# Patient Record
Sex: Female | Born: 1975 | Race: White | Hispanic: No | Marital: Married | Smoking: Never smoker
Health system: Southern US, Community
[De-identification: ages and names within clinical notes are randomized; demographics above are authoritative.]

## PROBLEM LIST (undated history)

## (undated) DIAGNOSIS — K900 Celiac disease: Secondary | ICD-10-CM

## (undated) DIAGNOSIS — R51 Headache: Secondary | ICD-10-CM

## (undated) DIAGNOSIS — R112 Nausea with vomiting, unspecified: Secondary | ICD-10-CM

## (undated) DIAGNOSIS — Z923 Personal history of irradiation: Secondary | ICD-10-CM

## (undated) DIAGNOSIS — E039 Hypothyroidism, unspecified: Secondary | ICD-10-CM

## (undated) DIAGNOSIS — E079 Disorder of thyroid, unspecified: Secondary | ICD-10-CM

## (undated) DIAGNOSIS — C801 Malignant (primary) neoplasm, unspecified: Secondary | ICD-10-CM

## (undated) DIAGNOSIS — IMO0001 Reserved for inherently not codable concepts without codable children: Secondary | ICD-10-CM

## (undated) DIAGNOSIS — Z9889 Other specified postprocedural states: Secondary | ICD-10-CM

## (undated) DIAGNOSIS — IMO0002 Reserved for concepts with insufficient information to code with codable children: Secondary | ICD-10-CM

## (undated) DIAGNOSIS — F419 Anxiety disorder, unspecified: Secondary | ICD-10-CM

## (undated) DIAGNOSIS — Z9221 Personal history of antineoplastic chemotherapy: Secondary | ICD-10-CM

## (undated) HISTORY — PX: GALLBLADDER SURGERY: SHX652

## (undated) HISTORY — PX: MASTECTOMY: SHX3

## (undated) HISTORY — PX: CHOLECYSTECTOMY: SHX55

## (undated) HISTORY — DX: Celiac disease: K90.0

## (undated) HISTORY — DX: Disorder of thyroid, unspecified: E07.9

---

## 2013-12-24 HISTORY — PX: MASTECTOMY: SHX3

## 2014-08-11 ENCOUNTER — Other Ambulatory Visit (INDEPENDENT_AMBULATORY_CARE_PROVIDER_SITE_OTHER): Payer: Self-pay | Admitting: General Surgery

## 2014-08-23 ENCOUNTER — Other Ambulatory Visit (INDEPENDENT_AMBULATORY_CARE_PROVIDER_SITE_OTHER): Payer: Self-pay

## 2014-08-23 ENCOUNTER — Encounter (INDEPENDENT_AMBULATORY_CARE_PROVIDER_SITE_OTHER): Payer: Self-pay | Admitting: General Surgery

## 2014-08-23 ENCOUNTER — Ambulatory Visit (INDEPENDENT_AMBULATORY_CARE_PROVIDER_SITE_OTHER): Payer: BC Managed Care – PPO | Admitting: General Surgery

## 2014-08-23 VITALS — BP 130/72 | HR 79 | Temp 98.5°F | Ht 62.0 in | Wt 121.2 lb

## 2014-08-23 DIAGNOSIS — D0592 Unspecified type of carcinoma in situ of left breast: Secondary | ICD-10-CM

## 2014-08-23 DIAGNOSIS — C50911 Malignant neoplasm of unspecified site of right female breast: Secondary | ICD-10-CM | POA: Insufficient documentation

## 2014-08-23 DIAGNOSIS — D0591 Unspecified type of carcinoma in situ of right breast: Secondary | ICD-10-CM

## 2014-08-23 DIAGNOSIS — C50919 Malignant neoplasm of unspecified site of unspecified female breast: Secondary | ICD-10-CM

## 2014-08-23 NOTE — Progress Notes (Addendum)
Patient ID: Misty [name], female   DOB: [birth year], 38 y.o.   MRN: [id number]  Chief Complaint  Patient presents with  . New Evaluation    Breast    HPI Misty [name] is a 38 y.o. female.  She is referred by Dr. 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  This is a healthy young woman. I have done her laparoscopic cholecystectomy. She is here today with her husband and her mother. She has not had any breast problems in the past although they did surveillance of the right breast every 6 months in 2011. She felt a lump in the lower outer quadrant of the right breast.She has some intermittent pain on both sides. Imaging studies were done including mammograms and ultrasound. They found a 2.1 cm suspicious mass in the right lower outer quadrant at 8:00 which was palpable. There was a 1 cm mass in the right breast at the 5:00 position. Additionally she had a small mass at the 3:00 position, subareolar and another small mass at the 10:00 position, 7 cm from the nipple. The left breast looked basically normal, apparently, a cyst was seen in the upper outer quadrant.   Image guided biopsy of the right breast mass at 8:00 in the right breast mass at 5:00 both showed invasive ductal carcinoma. I do not see a breast diagnostic profile. MRI has not been scheduled.  Comorbidities include celiac disease, hypothyroidism, C-section, cholecystectomy  Family history reveals breast cancer in a maternal grandmother and a maternal great aunt. A paternal great aunt died of some kind of cancer. There's no ovarian cancer in the family. HPI  Past Medical History  Diagnosis Date  . Thyroid disease   . Celiac disease     Past Surgical History  Procedure Laterality Date  . Cesarean section    . Gallbladder surgery      Family History  Problem Relation Age of Onset  . Skin cancer Father   . Breast cancer Maternal Grandmother     Social History History   Substance Use Topics  . Smoking status: Never Smoker   . Smokeless tobacco: Not on file  . Alcohol Use: No    Allergies  Allergen Reactions  . Gluten Meal   . Penicillins Nausea And Vomiting    Current Outpatient Prescriptions  Medication Sig Dispense Refill  . SYNTHROID 100 MCG tablet        No current facility-administered medications for this visit.    Review of Systems Review of Systems  Constitutional: Negative for fever, chills and unexpected weight change.  HENT: Negative for congestion, hearing loss, sore throat, trouble swallowing and voice change.   Eyes: Negative for visual disturbance.  Respiratory: Negative for cough and wheezing.   Cardiovascular: Negative for chest pain, palpitations and leg swelling.  Gastrointestinal: Negative for nausea, vomiting, abdominal pain, diarrhea, constipation, blood in stool, abdominal distention and anal bleeding.  Genitourinary: Negative for hematuria, vaginal bleeding and difficulty urinating.  Musculoskeletal: Negative for arthralgias.  Skin: Negative for rash and wound.  Neurological: Negative for seizures, syncope and headaches.  Hematological: Negative for adenopathy. Does not bruise/bleed easily.  Psychiatric/Behavioral: Negative for confusion.    Blood pressure 130/72, pulse 79, temperature 98.5 F (36.9 C), temperature source Oral, height 5' 2"  (1.575 m), weight 121 lb 4 oz (54.999 kg).  Physical Exam Physical Exam  Constitutional: She is oriented to person, place, and time. She appears well-developed and well-nourished. No distress.  HENT:  Head: Normocephalic and atraumatic.  Nose: Nose normal.  Mouth/Throat: No oropharyngeal exudate.  Eyes: Conjunctivae and EOM are normal. Pupils are equal, round, and reactive to light. Left eye exhibits no discharge. No scleral icterus.  Neck: Neck supple. No JVD present. No tracheal deviation present. No thyromegaly present.  Cardiovascular: Normal rate, regular rhythm,  normal heart sounds and intact distal pulses.   No murmur heard. Pulmonary/Chest: Effort normal and breath sounds normal. No respiratory distress. She has no wheezes. She has no rales. She exhibits no tenderness.  Breasts are small. Palpable mass and ecchymoses right breast lower outer quadrant. Mobile. Some lumpiness medially. No axillary adenopathy. Left breast is soft. No masses. No axillary adenopathy.  Abdominal: Soft. Bowel sounds are normal. She exhibits no distension and no mass. There is no tenderness. There is no rebound and no guarding.  Well-healed laparoscopy scars.  Musculoskeletal: She exhibits no edema and no tenderness.  Lymphadenopathy:    She has no cervical adenopathy.  Neurological: She is alert and oriented to person, place, and time. She exhibits normal muscle tone. Coordination normal.  Skin: Skin is warm. No rash noted. She is not diaphoretic. No erythema. No pallor.  Psychiatric: She has a normal mood and affect. Her behavior is normal. Judgment and thought content normal.    Data Reviewed Mammograms, [place] and mammograms reports. Biopsy and the report from the [facility] pathology department. We're scheduling genetic counseling, referral to medical oncology, MRI, requesting the slides for over read, plastic surgery.  Assessment    Premenopausal, multifocal invasive ductal carcinoma right breast, biopsy proven at 8:00 and 5:00, at least T2 N0 clinically. Breast diagnostic protocol pending.  Celiac disease  Hypothyroidism  Status post C-section  Status post cholecystectomy  Family history breast cancer in maternal grandmother and maternal great aunt      Plan    Very lengthy discussion, over one hour with all concerned. I told her that she would require mastectomy on the right and sentinel node biopsy on the right, with or without reconstruction, at the very minimum. Prophylactic contralateral mastectomy is something she is considering.   She'll be scheduled for  bilateral breast MRI Addendum:   (08/31/2014):     MRI shows extensive, multi-multifocal enhancement in the right breast. The left breast looks normal. All of her lymph nodes looked normal. No evidence of chest wall invasion.   She'll be referred to medical oncology. She requested Dr. Jana Hakim. Dawn is going to work on this.   We will request that the slides be brought down to [place] for overreading and  confirmation of breast diagnostic profile Addendum: 08/31/2014:     Dr. Joya Martyr was kind enough to review her outside pathology. Both breast biopsies confirmed invasive ductal carcinoma. By outside testing estrogen and progesterone receptor are strongly positive,  Ki-67 11%, HER-2-negative by FISH.   She'll be referred for genetic counseling and genetic testing  Addendum: (08/31/2014):    She has seen the genetic counselor and has had blood drawn for genetic testing. BRCA1/BRCA2 are negative BreastNext Gene Panel pending.   She'll be referred to plastic surgery ADDENDUM (08/31/2014):  She saw Dr. Crissie Reese on 08/27/2014. He felt that her best option would be tissue expander followed by implant at a later date. I talked about the lateral versus bilateral surgery. They talk a little bit of that nipple sparing mastectomy, which I think would be feasible on left. She is still considering her options.   She was offered a second opinion,,  she is considering that.    Return to see me in 3 weeks. It will take that long to get all this done.       Edsel Petrin. Dalbert Batman, M.D., [facility] General and Minimally invasive Surgery Breast and Colorectal Surgery Office:   [phone] Pager:   [phone]  08/23/2014, 5:31 PM

## 2014-08-23 NOTE — Patient Instructions (Signed)
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 We will have her slides brought down to [facility] to have the breast pathologist reviewed this  You will be scheduled for a bilateral breast MRI to make sure we were not missing something on either side or in the lymph nodes  You'll be referred to a medical oncologist. You have requested Dr. Jana Hakim  The medical oncologist will decide if you need a PET scan.  You'll be referred to a plastic surgeon to hear about plastic surgical options, since you will need to have a mastectomy on the right side, at least  If you desire a second opinion, please let us know and we will help you arrange that  Return to see Dr. Dalbert Batman in 3 weeks.

## 2014-08-24 ENCOUNTER — Other Ambulatory Visit: Payer: Self-pay | Admitting: Oncology

## 2014-08-25 ENCOUNTER — Other Ambulatory Visit: Payer: Self-pay | Admitting: *Deleted

## 2014-08-25 ENCOUNTER — Ambulatory Visit
Admission: RE | Admit: 2014-08-25 | Discharge: 2014-08-25 | Disposition: A | Discharge status: Home / Self Care | Payer: PRIVATE HEALTH INSURANCE | Source: Ambulatory Visit | Attending: General Surgery | Admitting: General Surgery

## 2014-08-25 DIAGNOSIS — C50511 Malignant neoplasm of lower-outer quadrant of right female breast: Secondary | ICD-10-CM

## 2014-08-25 MED ORDER — GADOBENATE DIMEGLUMINE 529 MG/ML IV SOLN
11.0000 mL | Freq: Once | INTRAVENOUS | Status: AC | PRN
  Administered 2014-08-25: 11 mL via INTRAVENOUS

## 2014-08-26 ENCOUNTER — Other Ambulatory Visit (HOSPITAL_BASED_OUTPATIENT_CLINIC_OR_DEPARTMENT_OTHER): Payer: BC Managed Care – PPO

## 2014-08-26 ENCOUNTER — Ambulatory Visit (HOSPITAL_BASED_OUTPATIENT_CLINIC_OR_DEPARTMENT_OTHER): Payer: BC Managed Care – PPO | Admitting: Genetic Counselor

## 2014-08-26 DIAGNOSIS — C50319 Malignant neoplasm of lower-inner quadrant of unspecified female breast: Secondary | ICD-10-CM

## 2014-08-26 DIAGNOSIS — C50511 Malignant neoplasm of lower-outer quadrant of right female breast: Secondary | ICD-10-CM

## 2014-08-26 DIAGNOSIS — IMO0002 Reserved for concepts with insufficient information to code with codable children: Secondary | ICD-10-CM

## 2014-08-26 DIAGNOSIS — Z808 Family history of malignant neoplasm of other organs or systems: Secondary | ICD-10-CM

## 2014-08-26 DIAGNOSIS — C50919 Malignant neoplasm of unspecified site of unspecified female breast: Secondary | ICD-10-CM

## 2014-08-26 DIAGNOSIS — Z803 Family history of malignant neoplasm of breast: Secondary | ICD-10-CM

## 2014-08-26 DIAGNOSIS — C50519 Malignant neoplasm of lower-outer quadrant of unspecified female breast: Secondary | ICD-10-CM

## 2014-08-26 LAB — CBC WITH DIFFERENTIAL/PLATELET
BASO%: 1.2 % (ref 0.0–2.0)
Basophils Absolute: 0.1 10*3/uL (ref 0.0–0.1)
EOS%: 3.6 % (ref 0.0–7.0)
Eosinophils Absolute: 0.2 10*3/uL (ref 0.0–0.5)
HCT: 40.4 % (ref 34.8–46.6)
HGB: 13.7 g/dL (ref 11.6–15.9)
LYMPH%: 22.1 % (ref 14.0–49.7)
MCH: 31.3 pg (ref 25.1–34.0)
MCHC: 33.8 g/dL (ref 31.5–36.0)
MCV: 92.7 fL (ref 79.5–101.0)
MONO#: 0.5 10*3/uL (ref 0.1–0.9)
MONO%: 7.2 % (ref 0.0–14.0)
NEUT#: 4.3 10*3/uL (ref 1.5–6.5)
NEUT%: 65.9 % (ref 38.4–76.8)
Platelets: 208 10*3/uL (ref 145–400)
RBC: 4.36 10*6/uL (ref 3.70–5.45)
RDW: 13.3 % (ref 11.2–14.5)
WBC: 6.5 10*3/uL (ref 3.9–10.3)
lymph#: 1.4 10*3/uL (ref 0.9–3.3)

## 2014-08-26 LAB — COMPREHENSIVE METABOLIC PANEL (CC13)
ALT: 13 U/L (ref 0–55)
AST: 18 U/L (ref 5–34)
Albumin: 4.1 g/dL (ref 3.5–5.0)
Alkaline Phosphatase: 58 U/L (ref 40–150)
Anion Gap: 7 meq/L (ref 3–11)
BUN: 10.3 mg/dL (ref 7.0–26.0)
CO2: 26 meq/L (ref 22–29)
Calcium: 9.5 mg/dL (ref 8.4–10.4)
Chloride: 107 meq/L (ref 98–109)
Creatinine: 0.8 mg/dL (ref 0.6–1.1)
Glucose: 79 mg/dL (ref 70–140)
Potassium: 4.7 meq/L (ref 3.5–5.1)
Sodium: 140 meq/L (ref 136–145)
Total Bilirubin: 0.48 mg/dL (ref 0.20–1.20)
Total Protein: 7.4 g/dL (ref 6.4–8.3)

## 2014-08-26 NOTE — Progress Notes (Signed)
HISTORY OF PRESENT ILLNESS: Dr. Dalbert Batman requested a cancer genetics consultation for Misty [name], a 38 y.o. female, due to a personal and family history of cancer.  Misty [name] presents to clinic today to discuss the possibility of a hereditary predisposition to cancer, genetic testing, and to further clarify her future cancer risks, as well as potential cancer risk for family members. Misty [name] was diagnosed with IDC of the right breast at age 67. She has no history of other cancer. She is currently deciding on a treatment plan with her oncology providers and would like to use genetic test results to help make surgical decisions.    Past Medical History  Diagnosis Date   Thyroid disease    Celiac disease     Past Surgical History  Procedure Laterality Date   Cesarean section     Gallbladder surgery      History   Social History   Marital Status: Married    Spouse Name: N/A    Number of Children: N/A   Years of Education: N/A   Social History Main Topics   Smoking status: Never Smoker    Smokeless tobacco: Not on file   Alcohol Use: No   Drug Use: No   Sexual Activity: Not on file   Other Topics Concern   Not on file   Social History Narrative   No narrative on file     FAMILY HISTORY:  During the visit, a 4-generation pedigree was obtained. Significant diagnoses include the following:  Family History  Problem Relation Age of Onset   Skin cancer Father    Breast cancer Maternal Grandmother     dx unknown age   Breast cancer Other 10    mat great aunt through [facility] with breast cancer    Misty [name]'s ancestry is of Caucasian descent. There is no known Jewish ancestry or consanguinity.  GENETIC COUNSELING ASSESSMENT: Misty [name] is a 38 y.o. female with a personal and family history of cancer suggestive of a hereditary predisposition to cancer. We, therefore, discussed and recommended the following at today's visit.   DISCUSSION: We reviewed the  characteristics, features and inheritance patterns of hereditary cancer syndromes. We also discussed genetic testing, including the appropriate family members to test, the process of testing, insurance coverage and turn-around-time for results. We discussed the implications of a negative, positive and/or variant of uncertain significant result. We recommended Misty [name] pursue genetic testing for BRCA1 and BRCA2, followed by reflex testing to a BreastNext gene panel if negative.   PLAN: Based on our above recommendation, Misty [name] wished to pursue genetic testing and the blood sample was drawn and will be sent to [facility] for analysis. Results for BCRA1 and BCRA2 should be available within approximately 2 weeks time, at which point they will be disclosed by telephone to Misty [name], as will any additional recommendations warranted by these results. If BreastNext testing is pursued, these results would then take an additional 2 weeks. We also encouraged Misty [name] to remain in contact with cancer genetics annually so that we can continuously update the family history and inform her of any changes in cancer genetics and testing that may be of benefit for this family. Ms.  [name] questions were answered to her satisfaction today. Our contact information was provided should additional questions or concerns arise.   Thank you for the referral and allowing [place] to share in the care of your patient.   The patient was seen for a total  of 35 minutes in face-to-face genetic counseling.  This patient was discussed with Dr. Jana Hakim who agrees with the above.    _______________________________________________________________________ For Office Staff:  Number of people involved in session: 3 Was an Intern/ student involved with case: not applicable

## 2014-08-31 ENCOUNTER — Telehealth: Payer: Self-pay | Admitting: *Deleted

## 2014-08-31 NOTE — Telephone Encounter (Signed)
Faxed request to [facility] requesting them to fax Pathology and Radiology reports to me.  Emailed Bary Castilla to inform her that I was handling that part and if she could contact pts plastic for their information and if she had a problem with anything, to advise me.

## 2014-09-02 ENCOUNTER — Ambulatory Visit: Payer: PRIVATE HEALTH INSURANCE

## 2014-09-02 ENCOUNTER — Ambulatory Visit (HOSPITAL_BASED_OUTPATIENT_CLINIC_OR_DEPARTMENT_OTHER): Payer: BC Managed Care – PPO | Admitting: Oncology

## 2014-09-02 ENCOUNTER — Encounter: Payer: Self-pay | Admitting: Oncology

## 2014-09-02 VITALS — BP 119/56 | HR 76 | Temp 98.1°F | Resp 18 | Ht 62.25 in | Wt 121.0 lb

## 2014-09-02 DIAGNOSIS — K900 Celiac disease: Secondary | ICD-10-CM

## 2014-09-02 DIAGNOSIS — Z170 Estrogen receptor positive status [ER+]: Secondary | ICD-10-CM | POA: Diagnosis not present

## 2014-09-02 DIAGNOSIS — C50319 Malignant neoplasm of lower-inner quadrant of unspecified female breast: Secondary | ICD-10-CM | POA: Diagnosis not present

## 2014-09-02 DIAGNOSIS — C50511 Malignant neoplasm of lower-outer quadrant of right female breast: Secondary | ICD-10-CM

## 2014-09-02 DIAGNOSIS — Z803 Family history of malignant neoplasm of breast: Secondary | ICD-10-CM

## 2014-09-02 DIAGNOSIS — C50519 Malignant neoplasm of lower-outer quadrant of unspecified female breast: Secondary | ICD-10-CM

## 2014-09-02 NOTE — Progress Notes (Signed)
Checked in new patient with no financial issues prior to seeing the dr. She has appt card and her [facility] packet. She has [place] been [place] of the [place]. I advised her alight and if she needed asst to call me. She has primary/secondary insurance.

## 2014-09-02 NOTE — Progress Notes (Signed)
Misty [name]  [phone] [phone]     ID: Misty [name] DOB: Jun 04, 1976  MR#: [id number]  XKG#:[id number]  Patient Care Team: Misty Chroman, MD as PCP - General (Internal Medicine) Misty Skates, MD as Consulting Physician (General Surgery) Misty Cruel, MD as Consulting Physician (Oncology) PCP: Misty [name]., MD Misty Held, MD (GYN) Misty Reese M.D. (Plastics)  CHIEF COMPLAINT: Estrogen receptor positive breast cancer  CURRENT TREATMENT: Awaiting definitive surgery         BREAST CANCER HISTORY:   Misty [name] was closely followed for some right breast changes between 18 11/12/2012. Everything seems stable at that time. In 2014 she was promoted in her job, was working very hard, and actually missed having a mammogram. Late in January 2015 she noted some pain in her right breast and by February when she lifted her arms she noted that the nipple was tilting sideways and seemed a little bit of gathered. She didn't think much of this however. It was not until July that she noted a palpable mass in the lateral right breast. She brought this to her gynecologist's attention and she was set up for diagnostic mammography at [facility] 08/04/2014.   There was an area of distortion in the lateral aspect of the right breast noted on mammography,  corresponding to a palpable firm mass at the 8:00 position of the right breast. Ultrasound confirmed a 2.4 cm irregular hypoechoic spiculated mass in the right breast at the 8:00 position and in addition at the 5:00 position 1 cm from the nipple there was a taller than wide irregular hypoechoic mass measuring 0.8 cm. There was no right axillary adenopathy. In the left breast upper-outer quadrant there was a minimally complicated cyst measuring 0.7 cm.  On 08/11/2014 the patient underwent biopsy of the 2 right breast masses noted on ultrasound. 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 The proliferation fraction by MIB-1 was 17% ([id number]; this report was reviewed by our pathologist, who concurred, [name] [phone]).  On 08/25/2014 the patient underwent bilateral breast MRI at [facility]. This showed, in the right breast, an irregular enhancing mass at the 8:00 position measuring 2.7 cm. Also in the right breast at the 6:00 position there was another enhancing mass measuring 0.6 cm, located 2 cm away from the larger mass. There were also masses in the retroareolar position measuring 0.7 cm, in the posterior central right breast measuring 0.5 cm, and another one just inferior to the midline measuring 5.5 cm. There were no masses or findings of concern in the left breast and no abnormal appearing lymph nodes.  The patient's subsequent history is as detailed below  INTERVAL HISTORY: Misty [name] was evaluated in the breast clinic 09/02/2014 accompanied by her husband Misty [name] and her mother.  REVIEW OF SYSTEMS: Aside from the breast mass itself, there were no worrisome symptoms leading to the diagnostic mammogram. Misty [name] does have a history of migraines, although these are rare, occurring perhaps twice a year. About a year ago she had a more normal feeling left-sided headache which was associated with blurring of her left eye vision. She tells me she did see an eye doctor around that time and that she had a normal exam including visual fields. In the last month she has had 2 or 3 more  of these episodes which are essentially as described: A light headache associated with some blurring of her vision in her left eye. There has been no nausea or vomiting, and her vision in the left eye remains completely normal after these episodes. She denies nausea, vomiting, stiff neck, dizziness, or gait imbalance,  though she does have a history of low blood pressure and she thinks that sometimes she may be a little "weak feeling" because of. There has not been any syncope. There has been no cough, phlegm production, or pleurisy, no chest pain or pressure, and no change in bowel or bladder habits. The patient denies fever, rash, bleeding, unexplained fatigue or unexplained weight loss. A detailed review of systems was otherwise entirely negative.  PAST MEDICAL HISTORY: Past Medical History  Diagnosis Date  . Thyroid disease   . Celiac disease     PAST SURGICAL HISTORY: Past Surgical History  Procedure Laterality Date  . Cesarean section    . Gallbladder surgery      FAMILY HISTORY Family History  Problem Relation Age of Onset  . Skin cancer Father   . Breast cancer Maternal Grandmother     dx unknown age  . Breast cancer Other 20    mat great aunt through [facility] with breast cancer   the patient's parents are both living, in their late 56s. The patient had one brother, no sisters. The patient's maternal grandmother was diagnosed with breast cancer in her late 32s. One of that grandmother's sisters was also diagnosed with breast cancer, in her 99s. There is no other history of breast or ovarian cancer in the family  GYNECOLOGIC HISTORY:  Patient's last menstrual period was 08/07/2014. Menarche age 11, first live birth age 83, the patient is GX P1. She is still having regular periods. 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 Her husband, Misty [name], works as a Engineer, structural in [place]. Their son Misty [name] is 82. The patient attends a local [place]: Not in place   HEALTH MAINTENANCE: History  Substance Use Topics  . Smoking status: Never Smoker   . Smokeless tobacco: Not on file  . Alcohol Use: No      Colonoscopy:  PAP: 07/29/2014  Bone density:  Lipid panel:  Allergies  Allergen Reactions  . Gluten Meal   . Penicillins Nausea And Vomiting    Current Outpatient Prescriptions  Medication Sig Dispense Refill  . SYNTHROID 100 MCG tablet        No current facility-administered medications for this visit.    OBJECTIVE: Middle-aged white woman who appears younger than stated age 38 Vitals:   09/02/14 1635  BP: 119/56  Pulse: 76  Temp: 98.1 F (36.7 C)  Resp: 18     Body mass index is 21.96 kg/(m^2).    ECOG FS:0 - Asymptomatic  Ocular: Sclerae unicteric, pupils equal, round and reactive to light, normal EOMs, no nystagmus Ear-nose-throat: Oropharynx clear, dentition in good repair Lymphatic: No cervical or supraclavicular adenopathy Lungs no rales or rhonchi, good excursion bilaterally Heart regular rate and rhythm, no murmur appreciated Abd soft, nontender, positive bowel sounds, no organomegaly MSK no focal spinal tenderness, no joint edema Neuro: non-focal, well-oriented, appropriate affect Breasts: There is a palpable mass in the right breast most easily approached from the lower outer quadrant all ports. It measures approximately 2 cm  by palpation. There is no other palpable mass and no skin or nipple change of concern. The right axilla is benign. The left breast is unremarkable   LAB RESULTS:  CMP     Component Value Date/Time   NA 140 08/26/2014 1500   K 4.7 08/26/2014 1500   CO2 26 08/26/2014 1500   GLUCOSE 79 08/26/2014 1500   BUN 10.3 08/26/2014 1500   CREATININE 0.8 08/26/2014 1500   CALCIUM 9.5 08/26/2014 1500   PROT 7.4 08/26/2014 1500   ALBUMIN 4.1 08/26/2014 1500   AST 18 08/26/2014 1500   ALT 13 08/26/2014 1500   ALKPHOS 58 08/26/2014 1500   BILITOT 0.48 08/26/2014 1500    I No results found for this basename: SPEP,  UPEP,   kappa and lambda light chains    Lab Results  Component Value Date   WBC 6.5 08/26/2014   NEUTROABS 4.3 08/26/2014   HGB 13.7 08/26/2014    HCT 40.4 08/26/2014   MCV 92.7 08/26/2014   PLT 208 08/26/2014      Chemistry      Component Value Date/Time   NA 140 08/26/2014 1500   K 4.7 08/26/2014 1500   CO2 26 08/26/2014 1500   BUN 10.3 08/26/2014 1500   CREATININE 0.8 08/26/2014 1500      Component Value Date/Time   CALCIUM 9.5 08/26/2014 1500   ALKPHOS 58 08/26/2014 1500   AST 18 08/26/2014 1500   ALT 13 08/26/2014 1500   BILITOT 0.48 08/26/2014 1500       No results found for this basename: LABCA2    No components found with this basename: LABCA125    No results found for this basename: INR,  in the last 168 hours  Urinalysis No results found for this basename: colorurine,  appearanceur,  labspec,  phurine,  glucoseu,  hgbur,  bilirubinur,  ketonesur,  proteinur,  urobilinogen,  nitrite,  leukocytesur    STUDIES: Mr Breast Bilateral W Wo Contrast  08/25/2014   CLINICAL DATA:  Patient with biopsy-proven right breast carcinoma in 2 locations, 1 in the 8 o'clock position of the right breast, and 5 o'clock position. Two other small suspicious masses or noted on right breast ultrasound, 1 in the 3 o'clock position, retroareolar, measuring 5 mm, and the other in the 10 o'clock position, 7 cm the nipple, measuring 7 mm.  LABS:  None  EXAM: BILATERAL BREAST MRI WITH AND WITHOUT CONTRAST  TECHNIQUE: Multiplanar, multisequence MR images of both breasts were obtained prior to and following the intravenous administration of 63m of MultiHance.  THREE-DIMENSIONAL MR IMAGE RENDERING ON INDEPENDENT WORKSTATION:  Three-dimensional MR images were rendered by post-processing of the original MR data on an independent workstation. The three-dimensional MR images were interpreted, and findings are reported in the following complete MRI report for this study. Three dimensional images were evaluated at the independent DynaCad workstation  COMPARISON:  Previous mammography and ultrasound.  FINDINGS: Breast composition: c.  Heterogeneous fibroglandular tissue.   Background parenchymal enhancement: Moderate  Right breast: Irregular enhancing mass with irregular margins centered on the 8 o'clock position of the right breast associated with a clip artifact. Enhancement is mildly heterogeneous. Mass measures 2.7 cm x 1.4 cm x 1.6 cm. Mass shows areas of relatively rapid wash-in and washout kinetics. There is a post biopsy hematoma along the anterior margin of this mass measuring 8 mm.  Inferior and medial to this, associated with a 6 o'clock location biopsy clip, there is an enhancing, round mass  with irregular margins measuring 6 mm. This lies 2 cm from the inferomedial margin of the 8 o'clock enhancing mass. This mass shows plateau and wash in kenetics.  In the anterior right breast, retroareolar position, slightly lateral to midline, there is a round, enhancing mass with irregular margins measuring 7 mm x 6 mm x 6 mm. This has plateau kinetics.  There is a small round enhancing mass with irregular margins in the posterior central right breast near the chest wall measuring 5.2 mm in diameter. It shows moderately rapid wash-in and and mild washout type kinetics.  There is a small, 5.5 mm, round oval mass with irregular in the right breast, anterior retroareolar region, just inferior to midline towards the 5 to 6 o'clock position. These show was moderately rapid wash-in and mild washout kinetics.  Other areas of focal enhancement throughout the right breast are similar to that seen on the left consistent with background parenchymal enhancement.  Left breast: No discrete mass or abnormal enhancement.  Lymph nodes: No abnormal appearing lymph nodes.  Ancillary findings:  None.  No evidence of chest wall involvement.  IMPRESSION: 1. The larger biopsied mass, in the 8 o'clock position of the right breast, areas a 2.7 cm x 1.4 cm x 1.6 cm irregular enhancing mass with irregular margins on MRI. There is associated biopsy clip a small post biopsy hematoma. 2. The other biopsied mass  lies inferior medial to this, separated by approximately 2 cm. It measures 6 mm in size. 3. There is a round enhancing mass with irregular margins anteriorly in the retroareolar region slightly lateral to midline measuring 7 mm in greatest dimension. This is consistent with the 3 o'clock ultrasound mass. This may reflect another focus of malignancy. 4. There is a small round enhancing mass in the posterior central right breast measuring just over 5 mm in diameter. This may reflect another focus of malignancy. 5. There is a final discrete enhancing retroareolar mass slightly inferior to the nipple in the 5-6 o'clock position measuring 5.5 mm in greatest dimension. This may reflect another focus of malignancy. 6. No suspicious mass or lesion is seen in the left breast. No abnormal lymph nodes or evidence of chest wall invasion.  RECOMMENDATION: If breast conservation surgery is to be considered, these additional areas of abnormal enhancement warrant biopsy. Since 2 lesions not previously biopsied were noted on the prior ultrasound, biopsy of the 3 o'clock retroareolar lesion and possibly the 10 o'clock lesion would be recommended. No additional biopsy would be indicated if mastectomy is planned. There is no evidence of left breast malignancy.  BI-RADS CATEGORY  6: Known biopsy-proven malignancy.   Electronically Signed   By: Lajean Manes M.D.   On: 08/25/2014 11:44    ASSESSMENT: 38 y.o. [place], [place] woman status post biopsy of 2 separate right breast masses 08/11/2014, both positive for an invasive ductal carcinoma, grade 1, estrogen and progesterone receptor strongly positive, with an MIB-1 of 19%, and HER-2 nonamplified  (1) genetics testing sent 08/26/2014, results pending.  PLAN: We spent the better part of today's hour-long appointment discussing the biology of breast cancer in general, and the specifics of the patient's tumor in particular. Misty [name] understands she has a stage II breast cancer, and I  would quote her a risk of having a positive lymph node in the 30% range. If she does have a mastectomy with clear margins and there are no positive lymph nodes, of course she would not need radiation. In that case she  would be interested in considering immediate reconstruction. She expressed me for an opinion and I felt that she would be a good candidate for immediate reconstruction and that even if she did made postmastectomy radiation with an expander in place we are finding many patients still have unacceptable cosmetic result.  However, given the uncertainty, at this point she is thinking that she will need more time to make the reconstruction decision. She is considering getting further opinions possibly at [facility] or [facility].   She is also strongly considering having bilateral mastectomies. She understands there is no survival advantage to this. However she is very concerned about the possibility of future breast cancers and even if her genetics testing turns out to be negative, at this point she is strongly leaning towards bilateral mastectomies. This of course would make her eventual reconstruction easier  We then discussed systemic therapy for breast cancer and she understands is a very good candidate for anti-estrogens and not a candidate for anti-HER-2 immunotherapy. The chemotherapy question is more complex. She will need an Oncotype and that will help [place] make the chemotherapy decision. Accordingly when she proceeds to surgery she will not have a port placed.  She was interested in having PET scans or other scans. We discussed the fact that this is not standard of care for stage II, and added exposes patients to significant radiation and also to the risk of false positives. After this discussion she is leaning against further staging tests. She will let me know if she changes her mind.  She will meet with Dr. Dalbert Batman in the near future and also her case will be presented at the multidisciplinary  breast cancer conference. I believe she will likely have her definitive surgery late this month or early next month. Accordingly I am arranging to meet with her late in October by which time we will have the Oncotype results and can make a definitive decision regarding chemotherapy.  Misty [name] has a good understanding of the overall plan. She agrees with it. She knows the goal of treatment in her case is cure. She will call with any problems that may develop before her next visit here.  Misty Cruel, MD   09/02/2014 6:06 PM

## 2014-09-07 ENCOUNTER — Other Ambulatory Visit: Payer: Self-pay | Admitting: Oncology

## 2014-09-09 ENCOUNTER — Encounter: Payer: Self-pay | Admitting: Genetic Counselor

## 2014-09-09 ENCOUNTER — Other Ambulatory Visit: Payer: Self-pay | Admitting: Emergency Medicine

## 2014-09-09 DIAGNOSIS — Z803 Family history of malignant neoplasm of breast: Secondary | ICD-10-CM

## 2014-09-09 NOTE — Progress Notes (Signed)
Ms. Marchiano recently had cancer genetic counseling at [facility] on August 26, 2014. At that time, it was recommended she pursue genetic testing. Her BRCA1 and BRCA2 gene test, which was performed at [facility], has returned and is negative for mutations. These results were disclosed to her today. Per her request, reflex testing for the BreastNext gene panel at [facility] was initiated. Results for the gene panel should be available in 2-3 more weeks and we will contact her to discuss these results and recommendations warranted by these results.  ° °

## 2014-09-14 ENCOUNTER — Other Ambulatory Visit (INDEPENDENT_AMBULATORY_CARE_PROVIDER_SITE_OTHER): Payer: Self-pay | Admitting: General Surgery

## 2014-09-14 DIAGNOSIS — C50911 Malignant neoplasm of unspecified site of right female breast: Secondary | ICD-10-CM

## 2014-09-21 ENCOUNTER — Encounter (HOSPITAL_BASED_OUTPATIENT_CLINIC_OR_DEPARTMENT_OTHER): Payer: Self-pay | Admitting: *Deleted

## 2014-09-22 ENCOUNTER — Encounter: Payer: Self-pay | Admitting: Genetic Counselor

## 2014-09-22 DIAGNOSIS — Z803 Family history of malignant neoplasm of breast: Secondary | ICD-10-CM

## 2014-09-22 NOTE — Progress Notes (Signed)
HPI:  Ms. Bentsen was previously seen in the [facility] clinic due to a personal and family history of cancer and concerns regarding a hereditary predisposition to cancer. Please refer to our prior cancer genetics clinic note for more information regarding Ms. Thresher's medical, social and family histories, and our assessment and recommendations, at the time. Ms. Geddis recent genetic test results were disclosed to her, as were recommendations warranted by these results. These results and recommendations are discussed in more detail below.  GENETIC TEST RESULTS: At the time of Ms. Strome visit, we recommended she pursue genetic testing of the BreastNext gene panel. This test, which included sequencing and deletion/duplication analysis of the genes, was performed at [facility]. Genetic testing was normal, and did not reveal a deleterious mutation in these genes. A complete list of all genes tested is located on the test report scanned into EP[employer].    We discussed with Ms. Stickle that since the current genetic testing is not perfect, it is possible there may be a gene mutation in one of these genes that current testing cannot detect, but that chance is small.  We also discussed, that it is possible that another gene that has not yet been discovered, or that we have not yet tested, is responsible for the cancer diagnoses in the family, and it is, therefore, important to remain in touch with cancer genetics in the future so that we can continue to offer Ms. Mccallister the most up to date genetic testing.   CANCER SCREENING RECOMMENDATIONS: This result is reassuring and suggests that Ms. Spaugh's cancer was most likely not due to an inherited predisposition associated with one of these genes.  Most cancers happen by chance and this negative test, along with details of her family history, suggests that her cancer falls into this category.  We, therefore, recommended she continue to follow the  cancer management and screening guidelines provided by her oncology and primary providers.   RECOMMENDATIONS FOR FAMILY MEMBERS:  Women in this family might be at some increased risk of developing cancer, over the general population risk, simply due to the family history of cancer.  We recommended women in this family have a yearly mammogram beginning at age 8, an an annual clinical breast exam, and perform monthly breast self-exams. Women in this family should also have a gynecological exam as recommended by their primary provider. All family members should have a colonoscopy by age 42.  FOLLOW-UP: Lastly, we discussed with Ms. Swallows that cancer genetics is a rapidly advancing field and it is possible that new genetic tests will be appropriate for her and/or her family members in the future. We encouraged her to remain in contact with cancer genetics on an annual basis so we can update her personal and family histories and let her know of advances in cancer genetics that may benefit this family.   Our contact number was provided. Ms. Mcquigg questions were answered to her satisfaction, and she knows she is welcome to call us at anytime with additional questions or concerns.   Catherine A. Fine, MS, CGC Certified Genetic Counseor [email or web address]

## 2014-09-23 ENCOUNTER — Encounter (HOSPITAL_BASED_OUTPATIENT_CLINIC_OR_DEPARTMENT_OTHER)
Admission: RE | Admit: 2014-09-23 | Discharge: 2014-09-23 | Disposition: A | Discharge status: Home / Self Care | Payer: BC Managed Care – PPO | Source: Ambulatory Visit | Attending: General Surgery | Admitting: General Surgery

## 2014-09-23 DIAGNOSIS — Z01812 Encounter for preprocedural laboratory examination: Secondary | ICD-10-CM | POA: Insufficient documentation

## 2014-09-23 DIAGNOSIS — C50511 Malignant neoplasm of lower-outer quadrant of right female breast: Secondary | ICD-10-CM | POA: Insufficient documentation

## 2014-09-23 LAB — CBC WITH DIFFERENTIAL/PLATELET
BASOS ABS: 0 10*3/uL (ref 0.0–0.1)
BASOS PCT: 0 % (ref 0–1)
EOS ABS: 0.2 10*3/uL (ref 0.0–0.7)
EOS PCT: 3 % (ref 0–5)
HEMATOCRIT: 39.9 % (ref 36.0–46.0)
Hemoglobin: 13.5 g/dL (ref 12.0–15.0)
Lymphocytes Relative: 21 % (ref 12–46)
Lymphs Abs: 1.4 10*3/uL (ref 0.7–4.0)
MCH: 31.5 pg (ref 26.0–34.0)
MCHC: 33.8 g/dL (ref 30.0–36.0)
MCV: 93 fL (ref 78.0–100.0)
MONO ABS: 0.4 10*3/uL (ref 0.1–1.0)
Monocytes Relative: 5 % (ref 3–12)
Neutro Abs: 4.8 10*3/uL (ref 1.7–7.7)
Neutrophils Relative %: 71 % (ref 43–77)
PLATELETS: 184 10*3/uL (ref 150–400)
RBC: 4.29 MIL/uL (ref 3.87–5.11)
RDW: 13.3 % (ref 11.5–15.5)
WBC: 6.8 10*3/uL (ref 4.0–10.5)

## 2014-09-23 LAB — COMPREHENSIVE METABOLIC PANEL
ALT: 12 U/L (ref 0–35)
AST: 19 U/L (ref 0–37)
Albumin: 4 g/dL (ref 3.5–5.2)
Alkaline Phosphatase: 60 U/L (ref 39–117)
Anion gap: 11 (ref 5–15)
BUN: 11 mg/dL (ref 6–23)
CALCIUM: 9.8 mg/dL (ref 8.4–10.5)
CO2: 27 mEq/L (ref 19–32)
CREATININE: 0.7 mg/dL (ref 0.50–1.10)
Chloride: 104 mEq/L (ref 96–112)
GFR calc Af Amer: >90 mL/min (ref >90)
GFR calc non Af Amer: >90 mL/min (ref >90)
Glucose, Bld: 71 mg/dL (ref 70–99)
Potassium: 3.9 mEq/L (ref 3.7–5.3)
SODIUM: 142 meq/L (ref 137–147)
TOTAL PROTEIN: 7.7 g/dL (ref 6.0–8.3)
Total Bilirubin: 0.5 mg/dL (ref 0.3–1.2)

## 2014-09-27 NOTE — H&P (Signed)
[name]  Location: [name] Patient #: [id number] DOB: [birth year] Married / Language: English / Race: White Female  History of Present Illness [facility] [name] MD; Patient words: Pt here for discussion of RIGHT breast masty.  The patient is a 38 year old female who presents with breast cancer. This patient returns to discuss surgical management of her multifocal invasive right breast cancer. 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 Subsequent MRI shows extensive, multifocal enhancement of the right breast. The left breast looks normal. All of her lymph nodes looked normal. There is no evidence of chest wall invasion. Genetic testing revealed BRCA1 and BRCA2 are negative. The larger Gene panel is pending. Pathology from [name] was reviewed by Dr. Lyndon Code, confirming invasive ductal carcinoma, receptor positive, HER-2/neu negative. she has seen Dr. Jana Hakim. He has recommended holding off on decisions regarding chemotherapy until her node status is known and her Oncotype status is known. She has seen Dr. Crissie Reese who discussed options for unilateral versus bilateral reconstruction, immediate and delayed. She has now decided that she wants to proceed with right total mastectomy and sentinel mode biopsy. She wants to hold off on contralateral mastectomy and reconstruction until she has completed all her adjuvant therapies. She is concerned that if she gets an infection that will delay adjuvant therapies. I have agreed with her decision. she will be scheduled for right total mastectomy and sentinel node biopsy in the near future. I have discussed the indications, details, techniques, and numerous risk of the surgery with her and her family. She is aware of the risk of bleeding, infection, reoperation for complications, arm swelling, or numbness, shoulder  disability, and other unforeseen problems. She understands all these issues and all of her questions are answered. She agrees with this plan.   Other Problems [name];  Breast Cancer Lump In Breast Other disease, cancer, significant illness Thyroid Disease  Past Surgical History [name]; Breast Biopsy Right. Cesarean Section - 1 Gallbladder Surgery - Laparoscopic  Diagnostic Studies History [name];  Colonoscopy never Mammogram within last year Pap Smear 1-5 years ago  Allergies [name];  Wheat Gluten --Celiac Disease Penicillins  Medication History ([name];  Synthroid (100MCG Tablet, Oral daily) Active.  Social History [name]; No alcohol use No caffeine use No drug use Tobacco use Never smoker.  Family History [name];  Breast Cancer Family Members In General. Cancer Father. Diabetes Mellitus Mother. Hypertension Brother, Father, Mother. Thyroid problems Mother.  Pregnancy / Birth History [name]; 09/14/2014 1:45 PM) Age at menarche 32 years. Contraceptive History Oral contraceptives. Gravida 1 Maternal age 51-30 Para 1 Regular periods  Review of Systems [name]; General Present- Appetite Loss and Fatigue. Not Present- Chills, Fever, Night Sweats, Weight Gain and Weight Loss. Skin Not Present- Change in Wart/Mole, Dryness, Hives, Jaundice, New Lesions, Non-Healing Wounds, Rash and Ulcer. HEENT Not Present- Earache, Hearing Loss, Hoarseness, Nose Bleed, Oral Ulcers, Ringing in the Ears, Seasonal Allergies, Sinus Pain, Sore Throat, Visual Disturbances, Wears glasses/contact lenses and Yellow Eyes. Respiratory Not Present- Bloody sputum, Chronic Cough, Difficulty Breathing, Snoring and Wheezing. Breast Present- Breast Mass and Breast Pain. Not Present- Nipple Discharge and Skin Changes. Cardiovascular Not Present- Chest Pain, Difficulty Breathing Lying Down,  Leg Cramps, Palpitations, Rapid Heart Rate, Shortness of Breath and Swelling of Extremities. Gastrointestinal Not Present- Abdominal Pain, Bloating, Bloody Stool, Change in Bowel Habits, Chronic diarrhea,  Constipation, Difficulty Swallowing, Excessive gas, Gets full quickly at meals, Hemorrhoids, Indigestion, Nausea, Rectal Pain and Vomiting. Female Genitourinary Not Present- Frequency, Nocturia, Painful Urination, Pelvic Pain and Urgency. Musculoskeletal Not Present- Back Pain, Joint Pain, Joint Stiffness, Muscle Pain, Muscle Weakness and Swelling of Extremities. Neurological Present- Headaches. Not Present- Decreased Memory, Fainting, Numbness, Seizures, Tingling, Tremor, Trouble walking and Weakness. Psychiatric Present- Anxiety and Change in Sleep Pattern. Not Present- Bipolar, Depression, Fearful and Frequent crying. Endocrine Not Present- Cold Intolerance, Excessive Hunger, Hair Changes, Heat Intolerance, Hot flashes and New Diabetes. Hematology Not Present- Easy Bruising, Excessive bleeding, Gland problems, HIV and Persistent Infections.   Vitals [name]; 09/14/2014 1:46 PM Weight: 120.38 lb Height: 61in Body Surface Area: 1.53 m Body Mass Index: 22.74 kg/m Temp.: 97.15F(Oral)  Pulse: 66 (Regular)  Resp.: 16 (Unlabored)  BP: 116/70 (Sitting, Left Arm, Standard)    Physical Exam [facility] [name] MD;  General Mental Status-Alert. General Appearance-Consistent with stated age. Hydration-Well hydrated. Voice-Normal.  Head and Neck Head-normocephalic, atraumatic with no lesions or palpable masses. Trachea-midline. Thyroid Gland Characteristics - normal size and consistency.  Eye Eyeball - Bilateral-Extraocular movements intact. Sclera/Conjunctiva - Bilateral-No scleral icterus.  Chest and Lung Exam Chest and lung exam reveals -quiet, even and easy respiratory effort with no use of accessory muscles, normal resonance, no flatness or  dullness, non-tender and normal tactile fremitus and on auscultation, normal breath sounds, no adventitious sounds and normal vocal resonance. Inspection Chest Wall - Normal. Back - normal.  Breast Note: Right breast reveals a palpable masslower outer. Left breast exam unremarkable. Nio axillary adenopathy.   Cardiovascular Cardiovascular examination reveals -on palpation PMI is normal in location and amplitude, no palpable S3 or S4. Normal cardiac borders., normal heart sounds, regular rate and rhythm with no murmurs, carotid auscultation reveals no bruits and normal pedal pulses bilaterally.  Abdomen Inspection Inspection of the abdomen reveals - No Hernias. Palpation/Percussion Palpation and Percussion of the abdomen reveal - Soft, Non Tender, No Rebound tenderness, No Rigidity (guarding) and No hepatosplenomegaly. Auscultation Auscultation of the abdomen reveals - Bowel sounds normal.  Peripheral Vascular Upper Extremity Inspection - Bilateral - Normal - No Clubbing, No Cyanosis, No Edema, Pulses Intact. Palpation - Pulses bilaterally normal. Lower Extremity Palpation - Pulses bilaterally normal.  Neurologic Neurologic evaluation reveals -alert and oriented x 3 with no impairment of recent or remote memory. Mental Status-Normal.  Musculoskeletal Normal Exam - Left-Upper Extremity Strength Normal and Lower Extremity Strength Normal. Normal Exam - Right-Upper Extremity Strength Normal, Lower Extremity Weakness.  Lymphatic Head & Neck  General Head & Neck Lymphatics: Bilateral - Description - Normal. Axillary  General Axillary Region: Bilateral - Description - Normal. Tenderness - Non Tender. Femoral & Inguinal  Generalized Femoral & Inguinal Lymphatics: Bilateral - Description - Normal. Tenderness - Non Tender.    Assessment & Plan [facility] [name] MD;  PRIMARY CANCER OF RIGHT FEMALE BREAST (174.9  C50.911) Impression: Multifocal, multi-quadrant  disease Following extensive multidisciplinary consultation, the patient and I have agreed to proceed with right total mastectomy and right axillary sentinel node biopsy without reconstruction. Current Plans  Schedule for Surgery   Signed by Adin Hector, MD

## 2014-09-28 ENCOUNTER — Encounter (HOSPITAL_BASED_OUTPATIENT_CLINIC_OR_DEPARTMENT_OTHER): Payer: BC Managed Care – PPO | Admitting: Anesthesiology

## 2014-09-28 ENCOUNTER — Encounter (HOSPITAL_BASED_OUTPATIENT_CLINIC_OR_DEPARTMENT_OTHER)
Admission: RE | Disposition: A | Discharge status: Home / Self Care | Payer: Self-pay | Source: Ambulatory Visit | Attending: General Surgery

## 2014-09-28 ENCOUNTER — Encounter (HOSPITAL_BASED_OUTPATIENT_CLINIC_OR_DEPARTMENT_OTHER): Payer: Self-pay

## 2014-09-28 ENCOUNTER — Ambulatory Visit (HOSPITAL_BASED_OUTPATIENT_CLINIC_OR_DEPARTMENT_OTHER)
Admission: RE | Admit: 2014-09-28 | Discharge: 2014-09-29 | Disposition: A | Discharge status: Home / Self Care | Payer: BC Managed Care – PPO | Source: Ambulatory Visit | Attending: General Surgery | Admitting: General Surgery

## 2014-09-28 ENCOUNTER — Ambulatory Visit (HOSPITAL_BASED_OUTPATIENT_CLINIC_OR_DEPARTMENT_OTHER): Payer: BC Managed Care – PPO | Admitting: Anesthesiology

## 2014-09-28 ENCOUNTER — Encounter (HOSPITAL_COMMUNITY)
Admission: RE | Admit: 2014-09-28 | Discharge: 2014-09-28 | Disposition: A | Discharge status: Home / Self Care | Payer: BC Managed Care – PPO | Source: Ambulatory Visit | Attending: General Surgery | Admitting: General Surgery

## 2014-09-28 DIAGNOSIS — C50511 Malignant neoplasm of lower-outer quadrant of right female breast: Secondary | ICD-10-CM

## 2014-09-28 DIAGNOSIS — E039 Hypothyroidism, unspecified: Secondary | ICD-10-CM | POA: Insufficient documentation

## 2014-09-28 DIAGNOSIS — C50911 Malignant neoplasm of unspecified site of right female breast: Secondary | ICD-10-CM | POA: Insufficient documentation

## 2014-09-28 HISTORY — DX: Nausea with vomiting, unspecified: R11.2

## 2014-09-28 HISTORY — DX: Anxiety disorder, unspecified: F41.9

## 2014-09-28 HISTORY — DX: Hypothyroidism, unspecified: E03.9

## 2014-09-28 HISTORY — PX: SIMPLE MASTECTOMY WITH AXILLARY SENTINEL NODE BIOPSY: SHX6098

## 2014-09-28 HISTORY — DX: Other specified postprocedural states: Z98.890

## 2014-09-28 HISTORY — DX: Headache: R51

## 2014-09-28 SURGERY — SIMPLE MASTECTOMY WITH AXILLARY SENTINEL NODE BIOPSY
Anesthesia: General | Site: Breast | Laterality: Right

## 2014-09-28 MED ORDER — OXYCODONE HCL 5 MG PO TABS
5.0000 mg | ORAL_TABLET | Freq: Once | ORAL | Status: AC | PRN
Start: 2014-09-28 — End: 2014-09-28

## 2014-09-28 MED ORDER — PHENYLEPHRINE HCL 10 MG/ML IJ SOLN
INTRAMUSCULAR | Status: DC | PRN
  Administered 2014-09-28 (×2): 40 ug via INTRAVENOUS

## 2014-09-28 MED ORDER — CEFAZOLIN SODIUM-DEXTROSE 2-3 GM-% IV SOLR
2.0000 g | INTRAVENOUS | Status: AC
  Administered 2014-09-28: 2 g via INTRAVENOUS

## 2014-09-28 MED ORDER — SCOPOLAMINE 1 MG/3DAYS TD PT72: 1.0000 | MEDICATED_PATCH | TRANSDERMAL | Status: DC

## 2014-09-28 MED ORDER — FENTANYL CITRATE 0.05 MG/ML IJ SOLN
50.0000 ug | INTRAMUSCULAR | Status: DC | PRN
  Administered 2014-09-28: 50 ug via INTRAVENOUS
  Administered 2014-09-28: 100 ug via INTRAVENOUS

## 2014-09-28 MED ORDER — CHLORHEXIDINE GLUCONATE 4 % EX LIQD: 1.0000 "application " | Freq: Once | CUTANEOUS | Status: DC

## 2014-09-28 MED ORDER — ONDANSETRON HCL 4 MG/2ML IJ SOLN
INTRAMUSCULAR | Status: DC | PRN
  Administered 2014-09-28: 4 mg via INTRAVENOUS

## 2014-09-28 MED ORDER — CEFAZOLIN SODIUM-DEXTROSE 2-3 GM-% IV SOLR
INTRAVENOUS | Status: AC
  Filled 2014-09-28: qty 50

## 2014-09-28 MED ORDER — CEFAZOLIN SODIUM-DEXTROSE 2-3 GM-% IV SOLR
2.0000 g | Freq: Three times a day (TID) | INTRAVENOUS | Status: DC
  Administered 2014-09-28 – 2014-09-29 (×2): 2 g via INTRAVENOUS

## 2014-09-28 MED ORDER — DIPHENHYDRAMINE HCL 50 MG/ML IJ SOLN
INTRAMUSCULAR | Status: DC | PRN
  Administered 2014-09-28: 6.25 mg via INTRAVENOUS

## 2014-09-28 MED ORDER — SUFENTANIL CITRATE 50 MCG/ML IV SOLN
INTRAVENOUS | Status: DC | PRN
  Administered 2014-09-28: 10 ug via INTRAVENOUS

## 2014-09-28 MED ORDER — TECHNETIUM TC 99M SULFUR COLLOID FILTERED
1.0000 | Freq: Once | INTRAVENOUS | Status: AC | PRN
  Administered 2014-09-28: 1 via INTRADERMAL

## 2014-09-28 MED ORDER — FENTANYL CITRATE 0.05 MG/ML IJ SOLN
INTRAMUSCULAR | Status: AC
  Filled 2014-09-28: qty 2

## 2014-09-28 MED ORDER — PROPOFOL INFUSION 10 MG/ML OPTIME
INTRAVENOUS | Status: DC | PRN
  Administered 2014-09-28: 25 ug/kg/min via INTRAVENOUS

## 2014-09-28 MED ORDER — MIDAZOLAM HCL 2 MG/2ML IJ SOLN
1.0000 mg | INTRAMUSCULAR | Status: DC | PRN
  Administered 2014-09-28: 1 mg via INTRAVENOUS
  Administered 2014-09-28: 2 mg via INTRAVENOUS

## 2014-09-28 MED ORDER — ENOXAPARIN SODIUM 40 MG/0.4ML ~~LOC~~ SOLN: 40.0000 mg | SUBCUTANEOUS | Status: DC

## 2014-09-28 MED ORDER — PROMETHAZINE HCL 25 MG/ML IJ SOLN: 6.2500 mg | INTRAMUSCULAR | Status: DC | PRN

## 2014-09-28 MED ORDER — MIDAZOLAM HCL 2 MG/2ML IJ SOLN
INTRAMUSCULAR | Status: AC
  Filled 2014-09-28: qty 2

## 2014-09-28 MED ORDER — MIDAZOLAM HCL 5 MG/5ML IJ SOLN
INTRAMUSCULAR | Status: DC | PRN
  Administered 2014-09-28: 2 mg via INTRAVENOUS

## 2014-09-28 MED ORDER — OXYCODONE-ACETAMINOPHEN 5-325 MG PO TABS: 1.0000 | ORAL_TABLET | ORAL | Status: DC | PRN

## 2014-09-28 MED ORDER — HYDROMORPHONE HCL 1 MG/ML IJ SOLN
INTRAMUSCULAR | Status: AC
  Filled 2014-09-28: qty 1

## 2014-09-28 MED ORDER — BUPIVACAINE-EPINEPHRINE (PF) 0.5% -1:200000 IJ SOLN
INTRAMUSCULAR | Status: DC | PRN
  Administered 2014-09-28: 25 mL

## 2014-09-28 MED ORDER — PROPOFOL 10 MG/ML IV BOLUS
INTRAVENOUS | Status: DC | PRN
  Administered 2014-09-28: 200 mg via INTRAVENOUS

## 2014-09-28 MED ORDER — SUFENTANIL CITRATE 50 MCG/ML IV SOLN
INTRAVENOUS | Status: AC
Start: 2014-09-28 — End: 2014-09-28
  Filled 2014-09-28: qty 1

## 2014-09-28 MED ORDER — DEXAMETHASONE SODIUM PHOSPHATE 4 MG/ML IJ SOLN
INTRAMUSCULAR | Status: DC | PRN
  Administered 2014-09-28: 10 mg via INTRAVENOUS

## 2014-09-28 MED ORDER — OXYCODONE HCL 5 MG/5ML PO SOLN
5.0000 mg | Freq: Once | ORAL | Status: AC | PRN
Start: 2014-09-28 — End: 2014-09-28

## 2014-09-28 MED ORDER — ACETAMINOPHEN 500 MG PO TABS
1000.0000 mg | ORAL_TABLET | ORAL | Status: DC | PRN
  Administered 2014-09-28 – 2014-09-29 (×3): 1000 mg via ORAL
  Filled 2014-09-28 (×3): qty 2

## 2014-09-28 MED ORDER — ONDANSETRON HCL 4 MG PO TABS: 4.0000 mg | ORAL_TABLET | Freq: Four times a day (QID) | ORAL | Status: DC | PRN

## 2014-09-28 MED ORDER — LEVOTHYROXINE SODIUM 100 MCG PO TABS
100.0000 ug | ORAL_TABLET | Freq: Every day | ORAL | Status: DC
  Administered 2014-09-29: 100 ug via ORAL

## 2014-09-28 MED ORDER — HYDROMORPHONE HCL 1 MG/ML IJ SOLN: 1.0000 mg | INTRAMUSCULAR | Status: DC | PRN

## 2014-09-28 MED ORDER — POTASSIUM CHLORIDE IN NACL 20-0.9 MEQ/L-% IV SOLN
INTRAVENOUS | Status: DC
  Administered 2014-09-28: 16:00:00 via INTRAVENOUS
  Filled 2014-09-28: qty 1000

## 2014-09-28 MED ORDER — SCOPOLAMINE 1 MG/3DAYS TD PT72
MEDICATED_PATCH | TRANSDERMAL | Status: AC
  Filled 2014-09-28: qty 1

## 2014-09-28 MED ORDER — METHYLENE BLUE 1 % INJ SOLN
INTRAMUSCULAR | Status: AC
  Filled 2014-09-28: qty 10

## 2014-09-28 MED ORDER — BUPIVACAINE-EPINEPHRINE (PF) 0.5% -1:200000 IJ SOLN
INTRAMUSCULAR | Status: AC
  Filled 2014-09-28: qty 30

## 2014-09-28 MED ORDER — SODIUM CHLORIDE 0.9 % IJ SOLN
INTRAMUSCULAR | Status: AC
  Filled 2014-09-28: qty 10

## 2014-09-28 MED ORDER — ENOXAPARIN SODIUM 40 MG/0.4ML ~~LOC~~ SOLN
40.0000 mg | SUBCUTANEOUS | Status: DC
Start: 2014-09-29 — End: 2014-09-29

## 2014-09-28 MED ORDER — LACTATED RINGERS IV SOLN
INTRAVENOUS | Status: DC
  Administered 2014-09-28 (×2): via INTRAVENOUS
  Administered 2014-09-28: 10 mL/h via INTRAVENOUS

## 2014-09-28 MED ORDER — HYDROMORPHONE HCL 1 MG/ML IJ SOLN
0.2500 mg | INTRAMUSCULAR | Status: DC | PRN
  Administered 2014-09-28: 0.5 mg via INTRAVENOUS
  Administered 2014-09-28: 0.25 mg via INTRAVENOUS

## 2014-09-28 MED ORDER — SODIUM CHLORIDE 0.9 % IJ SOLN
INTRAMUSCULAR | Status: DC | PRN
  Administered 2014-09-28: 01:00:00

## 2014-09-28 MED ORDER — ONDANSETRON HCL 4 MG/2ML IJ SOLN
4.0000 mg | Freq: Four times a day (QID) | INTRAMUSCULAR | Status: DC | PRN
  Administered 2014-09-28: 4 mg via INTRAVENOUS
  Filled 2014-09-28: qty 2

## 2014-09-28 SURGICAL SUPPLY — 66 items
APPLIER CLIP 11 MED OPEN (CLIP) ×3
BANDAGE ELASTIC 6 VELCRO ST LF (GAUZE/BANDAGES/DRESSINGS) IMPLANT
BINDER BREAST LRG (GAUZE/BANDAGES/DRESSINGS) IMPLANT
BINDER BREAST MEDIUM (GAUZE/BANDAGES/DRESSINGS) ×3 IMPLANT
BINDER BREAST XLRG (GAUZE/BANDAGES/DRESSINGS) IMPLANT
BIOPATCH RED 1 DISK 7.0 (GAUZE/BANDAGES/DRESSINGS) ×4 IMPLANT
BIOPATCH RED 1IN DISK 7.0MM (GAUZE/BANDAGES/DRESSINGS) ×2
BLADE CLIPPER SURG (BLADE) IMPLANT
BLADE HEX COATED 2.75 (ELECTRODE) ×3 IMPLANT
BLADE SURG 10 STRL SS (BLADE) ×3 IMPLANT
BLADE SURG 15 STRL LF DISP TIS (BLADE) ×1 IMPLANT
BLADE SURG 15 STRL SS (BLADE) ×2
CANISTER SUCT 1200ML W/VALVE (MISCELLANEOUS) ×3 IMPLANT
CHLORAPREP W/TINT 26ML (MISCELLANEOUS) ×3 IMPLANT
CLIP APPLIE 11 MED OPEN (CLIP) ×1 IMPLANT
COVER MAYO STAND STRL (DRAPES) ×3 IMPLANT
COVER PROBE W GEL 5X96 (DRAPES) ×3 IMPLANT
COVER TABLE BACK 60X90 (DRAPES) ×3 IMPLANT
DECANTER SPIKE VIAL GLASS SM (MISCELLANEOUS) IMPLANT
DERMABOND ADVANCED (GAUZE/BANDAGES/DRESSINGS) ×2
DERMABOND ADVANCED .7 DNX12 (GAUZE/BANDAGES/DRESSINGS) ×1 IMPLANT
DRAIN CHANNEL 19F RND (DRAIN) ×6 IMPLANT
DRAPE LAPAROSCOPIC ABDOMINAL (DRAPES) ×3 IMPLANT
DRAPE UTILITY XL STRL (DRAPES) ×3 IMPLANT
DRSG EMULSION OIL 3X3 NADH (GAUZE/BANDAGES/DRESSINGS) IMPLANT
DRSG PAD ABDOMINAL 8X10 ST (GAUZE/BANDAGES/DRESSINGS) ×3 IMPLANT
ELECT BLADE 4.0 EZ CLEAN MEGAD (MISCELLANEOUS) ×3
ELECT REM PT RETURN 9FT ADLT (ELECTROSURGICAL) ×3
ELECTRODE BLDE 4.0 EZ CLN MEGD (MISCELLANEOUS) ×1 IMPLANT
ELECTRODE REM PT RTRN 9FT ADLT (ELECTROSURGICAL) ×1 IMPLANT
EVACUATOR SILICONE 100CC (DRAIN) ×6 IMPLANT
GAUZE SPONGE 4X4 12PLY STRL (GAUZE/BANDAGES/DRESSINGS) ×3 IMPLANT
GLOVE BIO SURGEON STRL SZ 6.5 (GLOVE) ×4 IMPLANT
GLOVE BIO SURGEONS STRL SZ 6.5 (GLOVE) ×2
GLOVE BIOGEL PI IND STRL 7.0 (GLOVE) ×1 IMPLANT
GLOVE BIOGEL PI INDICATOR 7.0 (GLOVE) ×2
GLOVE EUDERMIC 7 POWDERFREE (GLOVE) ×3 IMPLANT
GLOVE SURG SS PI 7.0 STRL IVOR (GLOVE) ×3 IMPLANT
GOWN STRL REUS W/ TWL LRG LVL3 (GOWN DISPOSABLE) ×2 IMPLANT
GOWN STRL REUS W/ TWL XL LVL3 (GOWN DISPOSABLE) ×1 IMPLANT
GOWN STRL REUS W/TWL LRG LVL3 (GOWN DISPOSABLE) ×4
GOWN STRL REUS W/TWL XL LVL3 (GOWN DISPOSABLE) ×2
NDL SAFETY ECLIPSE 18X1.5 (NEEDLE) ×1 IMPLANT
NEEDLE HYPO 18GX1.5 SHARP (NEEDLE) ×2
NEEDLE HYPO 25X1 1.5 SAFETY (NEEDLE) ×3 IMPLANT
NS IRRIG 1000ML POUR BTL (IV SOLUTION) ×3 IMPLANT
PACK BASIN DAY SURGERY FS (CUSTOM PROCEDURE TRAY) ×3 IMPLANT
PAD ALCOHOL SWAB (MISCELLANEOUS) ×6 IMPLANT
PENCIL BUTTON HOLSTER BLD 10FT (ELECTRODE) ×3 IMPLANT
SHEET MEDIUM DRAPE 40X70 STRL (DRAPES) ×3 IMPLANT
SLEEVE SCD COMPRESS KNEE MED (MISCELLANEOUS) ×3 IMPLANT
SPONGE LAP 18X18 X RAY DECT (DISPOSABLE) ×6 IMPLANT
SPONGE LAP 4X18 X RAY DECT (DISPOSABLE) IMPLANT
STAPLER VISISTAT 35W (STAPLE) IMPLANT
SUT ETHILON 3 0 PS 1 (SUTURE) ×6 IMPLANT
SUT MNCRL AB 4-0 PS2 18 (SUTURE) ×3 IMPLANT
SUT SILK 2 0 FS (SUTURE) ×3 IMPLANT
SUT VICRYL 3-0 CR8 SH (SUTURE) ×6 IMPLANT
SYRINGE 10CC LL (SYRINGE) ×6 IMPLANT
TAPE CLOTH SURG 4X10 WHT LF (GAUZE/BANDAGES/DRESSINGS) IMPLANT
TOWEL OR 17X24 6PK STRL BLUE (TOWEL DISPOSABLE) ×3 IMPLANT
TOWEL OR NON WOVEN STRL DISP B (DISPOSABLE) ×3 IMPLANT
TRAY DSU PREP LF (CUSTOM PROCEDURE TRAY) IMPLANT
TUBE CONNECTING 20'X1/4 (TUBING) ×1
TUBE CONNECTING 20X1/4 (TUBING) ×2 IMPLANT
YANKAUER SUCT BULB TIP NO VENT (SUCTIONS) ×3 IMPLANT

## 2014-09-28 NOTE — Anesthesia Postprocedure Evaluation (Signed)
Anesthesia Post Note  Patient: Mist[facility] [name]  Procedure(s) Performed: Procedure(s) (LRB): RIGHT TOTAL MASTECTOMY, RIGHT  AXILLARY SENTINEL NODE BIOPSY (Right)  Anesthesia type: general  Patient location: PACU  Post pain: Pain level controlled  Post assessment: Patient's Cardiovascular Status Stable  Last Vitals:  Filed Vitals:   09/28/14 1530  BP: 117/64  Pulse: 100  Temp:   Resp: 16    Post vital signs: Reviewed and stable  Level of consciousness: sedated  Complications: No apparent anesthesia complications

## 2014-09-28 NOTE — Anesthesia Procedure Notes (Addendum)
Anesthesia Regional Block:  Pectoralis block  Pre-Anesthetic Checklist: ,, timeout performed, Correct Patient, Correct Site, Correct Laterality, Correct Procedure, Correct Position, site marked, Risks and benefits discussed,  Surgical consent,  Pre-op evaluation,  At surgeon's request and post-op pain management  Laterality: Right  Prep: chloraprep       Needles:  Injection technique: Single-shot  Needle Type: Echogenic Stimulator Needle     Needle Length: 10cm 10 cm Needle Gauge: 21 and 21 G    Additional Needles:  Procedures: ultrasound guided (picture in chart) Pectoralis block Narrative:  Start time: 09/28/2014 11:26 AM End time: 09/28/2014 11:36 AM Injection made incrementally with aspirations every 5 mL.  Performed by: Personally    Performed by: Melynda Ripple D    Procedure Name: LMA Insertion Date/Time: 09/28/2014 12:19 PM Performed by: Melynda Ripple D Pre-anesthesia Checklist: Patient identified, Emergency Drugs available, Suction available and Patient being monitored Patient Re-evaluated:Patient Re-evaluated prior to inductionOxygen Delivery Method: Circle System Utilized Preoxygenation: Pre-oxygenation with 100% oxygen Intubation Type: IV induction Ventilation: Mask ventilation without difficulty LMA: LMA inserted LMA Size: 4.0 Number of attempts: 1 Airway Equipment and Method: bite block Placement Confirmation: positive ETCO2 Tube secured with: Tape Dental Injury: Teeth and Oropharynx as per pre-operative assessment

## 2014-09-28 NOTE — Progress Notes (Signed)
Radiology staff here for nuc med  injections.     Sedation  given.  See MAR.  Pt. Tolerated procedure well.

## 2014-09-28 NOTE — Anesthesia Preprocedure Evaluation (Addendum)
Anesthesia Evaluation  Patient identified by MRN, date of birth, ID band Patient awake    Reviewed: Allergy & Precautions, H&P , NPO status , Patient's Chart, lab work & pertinent test results  History of Anesthesia Complications (+) PONV and history of anesthetic complications  Airway Mallampati: II TM Distance: >3 FB Neck ROM: full    Dental  (+) Teeth Intact, Dental Advidsory Given   Pulmonary neg pulmonary ROS,    Pulmonary exam normal       Cardiovascular negative cardio ROS      Neuro/Psych  Headaches,    GI/Hepatic negative GI ROS,   Endo/Other  Hypothyroidism   Renal/GU negative Renal ROS  negative genitourinary   Musculoskeletal   Abdominal Normal abdominal exam  (+)   Peds  Hematology negative hematology ROS (+)   Anesthesia Other Findings   Reproductive/Obstetrics negative OB ROS                           Anesthesia Physical Anesthesia Plan  ASA: II  Anesthesia Plan: General LMA   Post-op Pain Management: MAC Combined w/ Regional for Post-op pain   Induction:   Airway Management Planned: LMA  Additional Equipment:   Intra-op Plan:   Post-operative Plan: Extubation in OR  Informed Consent: I have reviewed the patients History and Physical, chart, labs and discussed the procedure including the risks, benefits and alternatives for the proposed anesthesia with the patient or authorized representative who has indicated his/her understanding and acceptance.   Dental Advisory Given  Plan Discussed with: Anesthesiologist, CRNA and Surgeon  Anesthesia Plan Comments:        Anesthesia Quick Evaluation

## 2014-09-28 NOTE — Op Note (Signed)
Patient Name:           Misty [name]   Date of Surgery:        09/28/2014  Pre op Diagnosis:      Multifocal, multi-quadrant invasive cancer right breast  Post op Diagnosis:    same  Procedure:                 Inject blue dye  in the right breast Right total mastectomy Right axillary sentinel node biopsy  Surgeon:                     Edsel Petrin. Dalbert Batman, M.D., FACS  Assistant:                      OR staff  Operative Indications:   The patient is a 38 year old female who presents with breast cancer. 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 Subsequent MRI shows extensive, multifocal enhancement of the right breast. The left breast looks normal. All of her lymph nodes looked normal. There is no evidence of chest wall invasion. Genetic testing revealed BRCA1 and BRCA2 are negative. The larger Gene panel is pending. Pathology from [facility] was reviewed by Dr. Lyndon Code, confirming invasive ductal carcinoma, receptor positive, HER-2/neu negative. she has seen Dr. Jana Hakim. He has recommended holding off on decisions regarding chemotherapy until her node status is known and her Oncotype status is known. She has seen Dr. Crissie Reese who discussed options for unilateral versus bilateral reconstruction, immediate and delayed. She has now decided that she wants to proceed with right total mastectomy and sentinel node biopsy. She wants to hold off on contralateral mastectomy and reconstruction until she has completed all her adjuvant therapies. She is concerned that if she gets an infection that will delay adjuvant therapies. I have agreed with her decision. she will be scheduled for right total mastectomy and sentinel node biopsy in the near future. I have discussed the indications, details, techniques, and numerous risk of the surgery with her and her family. She is aware of the risk of  bleeding, infection, reoperation for complications, arm swelling, or numbness, shoulder disability, and other unforeseen problems. She understands all these issues and all of her questions are answered. She agrees with this plan.   Operative Findings:       Account for her sentinel lymph nodes, all of which headstrong blue dye midrib which headstrong radioactivity. These were not pathologically enlarged. Dissection of the mastectomy specimen off the pectoralis major and minor muscles was uneventful. There did not appear to be any cancer at the deep margin.  Procedure in Detail:          The patient underwent injection of radionuclide into the right breast in the holding area. She was brought to the operating room where general anesthesia was induced. Surgical timeout was performed. Intravenous antibiotics were given. Following an alcohol prep I injected 5 cc of blue dye into the right breast, subareolar area. This was 2 cc of methylene blue mixed with 3 cc of saline. The breast was massaged for a few minutes.    The right breast, axilla and chest wall were then prepped and draped in a sterile fashion. Using a marking pen I marked the midline, anatomic boundaries of the breast and a transverse elliptical incision. A transverse elliptical incision was made with a knife. Skin flaps  were raised superiorly to the infraclavicular area, medially to the parasternal area, inferiorly to the upper aspect of the anterior rectus sheath and laterally to the latissimus dorsi muscle. The breast was then dissected off of the pectoralis major and minor muscles with electrocautery. A silk suture was placed in the lateral skin margin to orient the pathologist. The breast specimen was sent to the lab.    I then retracted the pectoral muscle medially and expose the axilla. Using the neoprobe I identified and removed four sentinel lymph nodes. This seemed to be all of the sentinel nodes present. These were sent separately.  Hemostasis was excellent and achieved with electrocautery and metal clips. The wounds were irrigated with saline. Hemostasis appeared to be excellent. Two 74 French Blake drains were placed, one up into the axillary area and one across the skin flaps. These were brought out  through separate stab incisions  infero- laterally, sutured to the skin with nylon sutures and connected to suction bulbes. The mastectomy incision was closed in layers. Subcutaneous tissue was closed with interrupted Vicryls and the skin closed with a running subcutaneous 4-0 Monocryl and Dermabond. Dry cushioning bandages were placed. A breast binder was placed. The patient tolerated the procedure well and  was taken to PACU in stable condition. EBL 75 cc or less. Counts correct. Complications none.     Edsel Petrin. Dalbert Batman, M.D., FACS General and Minimally Invasive Surgery Breast and Colorectal Surgery  09/28/2014 1:43 PM

## 2014-09-28 NOTE — Interval H&P Note (Signed)
History and Physical Interval Note:  09/28/2014 12:06 PM  Misty [name]  has presented today for surgery, with the diagnosis of right breast cancer  The goals and the various methods of treatment have been discussed with the patient and family. After consideration of risks, benefits and other options for treatment, the patient has consented to  Procedure(s): RIGHT TOTAL MASTECTOMY, RIGHT  AXILLARY SENTINEL NODE BIOPSY (Right) as a surgical intervention .  The patient's history has been reviewed, patient examined today, no change in status, stable for surgery.  I have reviewed the patient's chart and labs.  Questions were answered to the patient's satisfaction.     Adin Hector

## 2014-09-28 NOTE — Transfer of Care (Signed)
Immediate Anesthesia Transfer of Care Note  Patient: Misty [name]  Procedure(s) Performed: Procedure(s): RIGHT TOTAL MASTECTOMY, RIGHT  AXILLARY SENTINEL NODE BIOPSY (Right)  Patient Location: PACU  Anesthesia Type:General and Regional  Level of Consciousness: awake, alert  and patient cooperative  Airway & Oxygen Therapy: Patient Spontanous Breathing and Patient connected to face mask oxygen  Post-op Assessment: Report given to PACU RN and Post -op Vital signs reviewed and stable  Post vital signs: Reviewed and stable  Complications: No apparent anesthesia complications

## 2014-09-28 NOTE — Progress Notes (Signed)
Assisted Dr. Singer with right, ultrasound guided, pectoralis block. Side rails up, monitors on throughout procedure. See vital signs in flow sheet. Tolerated Procedure well. °

## 2014-09-29 ENCOUNTER — Encounter (HOSPITAL_BASED_OUTPATIENT_CLINIC_OR_DEPARTMENT_OTHER): Payer: Self-pay | Admitting: General Surgery

## 2014-09-29 DIAGNOSIS — C50911 Malignant neoplasm of unspecified site of right female breast: Secondary | ICD-10-CM | POA: Diagnosis not present

## 2014-09-29 MED ORDER — HYDROCODONE-ACETAMINOPHEN 5-325 MG PO TABS: 1.0000 | ORAL_TABLET | Freq: Four times a day (QID) | ORAL | Status: DC | PRN

## 2014-09-29 MED ORDER — CEFAZOLIN SODIUM-DEXTROSE 2-3 GM-% IV SOLR
INTRAVENOUS | Status: AC
  Filled 2014-09-29: qty 50

## 2014-09-29 NOTE — Discharge Instructions (Signed)
(  see above)  About my Jackson-Pratt Bulb Drain  What is a Jackson-Pratt bulb? A Jackson-Pratt is a soft, round device used to collect drainage. It is connected to a long, thin drainage catheter, which is held in place by one or two small stiches near your surgical incision site. When the bulb is squeezed, it forms a vacuum, forcing the drainage to empty into the bulb.  Emptying the Jackson-Pratt bulb- To empty the bulb: 1. Release the plug on the top of the bulb. 2. Pour the bulb's contents into a measuring container which your nurse will provide. 3. Record the time emptied and amount of drainage. Empty the drain(s) as often as your     doctor or nurse recommends.  Date                  Time                    Amount (Drain 1)                 Amount (Drain 2)  _____________________________________________________________________  _____________________________________________________________________  _____________________________________________________________________  _____________________________________________________________________  _____________________________________________________________________  _____________________________________________________________________  _____________________________________________________________________  _____________________________________________________________________  Squeezing the Jackson-Pratt Bulb- To squeeze the bulb: 1. Make sure the plug at the top of the bulb is open. 2. Squeeze the bulb tightly in your fist. You will hear air squeezing from the bulb. 3. Replace the plug while the bulb is squeezed. 4. Use a safety pin to attach the bulb to your clothing. This will keep the catheter from     pulling at the bulb insertion site.  When to call your doctor- Call your doctor if:  Drain site becomes red, swollen or hot.  You have a fever greater than 101 degrees F.  There is oozing at the drain site.  Drain falls out  (apply a guaze bandage over the drain hole and secure it with tape).  Drainage increases daily not related to activity patterns. (You will usually have more drainage when you are active than when you are resting.)  Drainage has a bad odor.

## 2014-09-29 NOTE — Discharge Summary (Signed)
Patient ID: Misty [name] [id number] 38 y.o. 01-19-1976  Admit date: 09/28/2014  Discharge date and time: 09/29/2014  Admitting Physician: Adin Hector  Discharge Physician: Adin Hector  Admission Diagnoses: right breast cancer  Discharge Diagnoses: multi-quadrant, invasive cancer right breast  Operations: Procedure(s): RIGHT TOTAL MASTECTOMY, RIGHT  AXILLARY SENTINEL NODE BIOPSY  Admission Condition: good  Discharged Condition: good  Indication for Admission: The patient is a 38 year old female who presents with breast cancer. 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 Subsequent MRI shows extensive, multifocal enhancement of the right breast. The left breast looks normal. All of her lymph nodes looked normal. There is no evidence of chest wall invasion. Genetic testing revealed BRCA1 and BRCA2 are negative.  Pathology from [facility] was reviewed by Dr. Lyndon Code, confirming invasive ductal carcinoma, receptor positive, HER-2/neu negative. she has seen Dr. Jana Hakim. He has recommended holding off on decisions regarding chemotherapy until her node status and Oncotype DX  is known.   She has seen Dr. Crissie Reese who discussed options for unilateral versus bilateral reconstruction, immediate and delayed. She has now decided that she wants to proceed with right total mastectomy and sentinel node biopsy. She wants to hold off on contralateral mastectomy and reconstruction until she has completed all her adjuvant therapies. She is concerned that if she gets an infection that will delay adjuvant therapies.I have discussed the indications, details, techniques, and numerous risk of the surgery with her and her family. She is aware of the risk of bleeding, infection, reoperation for complications, arm swelling, or numbness, shoulder disability, and other unforeseen problems.  She understands all these issues and all of her questions are answered. She agrees with this plan   Hospital Course: on the day of admission the [facility] She underwent a right total mastectomy and right axillary sentinel node biopsy. The surgery was uneventful. She was observed overnight. She had almost no pain. No nausea. Became ambulatory quickly. Tolerating light diet and was ready to go home. Examination on the morning of discharge revealed the patient was alert and in no distress. The right arm revealed no sensory deficit and no swelling. The mastectomy skin flaps were healthy without any ischemia. There was no hematoma or fluid collection. Both drains were draining serosanguineous fluid, low-volume. She was instructed in diet and activities. She and her husband were instructed in drain care and record-keeping. She was told pathology report will be recalled to her in 24-48 hours. She was given a prescription for Norco for pain and Zofran for nausea. I will see her back in the office in 10-12 days for a drain check.  Consults: None  Significant Diagnostic Studies: pathology (pending)  Treatments: surgery: right total mastectomy, right axillary sentinel ndeo biopsy  Disposition: Home  Patient Instructions:    Medication List         HYDROcodone-acetaminophen 5-325 MG per tablet  Commonly known as:  NORCO  Take 1-2 tablets by mouth every 6 (six) hours as needed for moderate pain or severe pain.     SYNTHROID 100 MCG tablet  Generic drug:  levothyroxine        Activity: activity restrictions discussed in detail. To ambulate as much as possible. Move right shoulder around in a limited way. Diet: regular diet Wound Care: as directed  Follow-up:  With Dr. Dalbert Batman in 10-12 days.  Signed: Edsel Petrin. Dalbert Batman,  M.D., FACS General and minimally invasive surgery Breast and Colorectal Surgery  09/29/2014, 5:32 AM

## 2014-10-03 ENCOUNTER — Telehealth (INDEPENDENT_AMBULATORY_CARE_PROVIDER_SITE_OTHER): Payer: Self-pay | Admitting: General Surgery

## 2014-10-03 NOTE — Telephone Encounter (Signed)
Discussed pathology report with patient. She otherwise seems to be doing well post op.   Edsel Petrin. Dalbert Batman, M.D., [facility] General and Minimally invasive Surgery Breast and Colorectal Surgery Office:   [phone]

## 2014-10-07 ENCOUNTER — Telehealth: Payer: Self-pay | Admitting: *Deleted

## 2014-10-07 NOTE — Telephone Encounter (Signed)
Scheduled and confirmed f/u appt with Dr. Jana Hakim on 10/29/14 at 2:30PM.  Gave pt navigation resources and contact information. Pt denies needs at this time. Encourage pt to call with questions or concerns. Received verbal understanding. Contact information given.

## 2014-10-11 ENCOUNTER — Other Ambulatory Visit (INDEPENDENT_AMBULATORY_CARE_PROVIDER_SITE_OTHER): Payer: Self-pay

## 2014-10-11 ENCOUNTER — Telehealth (INDEPENDENT_AMBULATORY_CARE_PROVIDER_SITE_OTHER): Payer: Self-pay

## 2014-10-11 DIAGNOSIS — I890 Lymphedema, not elsewhere classified: Secondary | ICD-10-CM

## 2014-10-11 NOTE — Telephone Encounter (Signed)
Message copied by Ivor Costa on Mon Oct 11, 2014  3:47 PM ------      Message from: Francina Ames      Created: Mon Oct 11, 2014  2:42 PM      Regarding: RE: Breast Tumor Board Add on       Hi! I have added the information to the pending list for next week.            Thank you!            ----- Message -----         From: Ivor Costa, CMA         Sent: 10/11/2014   2:15 PM           To: Francina Ames      Subject: Breast Tumor Board Add on                                Karlene Lineman,            Dr. Dalbert Batman would like for Mrs. Cade to be presented on the Breast Tumor Board next week.              Please let me know when this has been completed.            Ivor Costa for      Dr. Fanny Skates, MD       ------

## 2014-10-12 ENCOUNTER — Other Ambulatory Visit: Payer: Self-pay | Admitting: Oncology

## 2014-10-12 ENCOUNTER — Telehealth: Payer: Self-pay | Admitting: Oncology

## 2014-10-12 NOTE — Telephone Encounter (Signed)
per 2nd 10/20 pof from GM disregard 1st pof as this was for a different pt. both pof's deleted from inbox.

## 2014-10-14 ENCOUNTER — Encounter: Payer: Self-pay | Admitting: *Deleted

## 2014-10-14 NOTE — Progress Notes (Signed)
Put referral in for dietitian consult.

## 2014-10-15 ENCOUNTER — Telehealth: Payer: Self-pay | Admitting: Oncology

## 2014-10-15 NOTE — Telephone Encounter (Signed)
Lm to confirm appt.

## 2014-10-15 NOTE — Telephone Encounter (Signed)
Pt returned call and confirm d/t for appt 10/19/14.

## 2014-10-18 ENCOUNTER — Ambulatory Visit (HOSPITAL_BASED_OUTPATIENT_CLINIC_OR_DEPARTMENT_OTHER): Payer: BC Managed Care – PPO | Admitting: Oncology

## 2014-10-18 ENCOUNTER — Telehealth: Payer: Self-pay | Admitting: Oncology

## 2014-10-18 VITALS — BP 105/70 | HR 83 | Temp 97.8°F | Resp 17 | Ht 61.0 in | Wt 120.7 lb

## 2014-10-18 DIAGNOSIS — K900 Celiac disease: Secondary | ICD-10-CM

## 2014-10-18 DIAGNOSIS — Z170 Estrogen receptor positive status [ER+]: Secondary | ICD-10-CM

## 2014-10-18 DIAGNOSIS — C50311 Malignant neoplasm of lower-inner quadrant of right female breast: Secondary | ICD-10-CM

## 2014-10-18 DIAGNOSIS — C50511 Malignant neoplasm of lower-outer quadrant of right female breast: Secondary | ICD-10-CM

## 2014-10-18 DIAGNOSIS — Z803 Family history of malignant neoplasm of breast: Secondary | ICD-10-CM

## 2014-10-18 NOTE — Progress Notes (Signed)
[place]  Telephone:([phone]) [phone] [phone]) [phone]     ID: Misty [name] DOB: 1976/02/27  MR#: [id number]  BSW#:[id number]  Patient Care Team: Glenda Chroman, MD as PCP - General (Internal Medicine) Fanny Skates, MD as Consulting Physician (General Surgery) Chauncey Cruel, MD as Consulting Physician (Oncology) PCP: Glenda Chroman., MD Santo Held, MD (GYN) Crissie Reese M.D. (Plastics)  CHIEF COMPLAINT: Estrogen receptor positive breast cancer  CURRENT TREATMENT: adjuvant chemotherapy     BREAST CANCER HISTORY: From the original intake note:  Misty [name] was closely followed for some right breast changes between 20 11/12/2012. Everything seemed stable at that time. In 2014 she was promoted in her job, was working very hard, and actually missed having a mammogram. Late in January 2015 she noted some pain in her right breast and by February when she lifted her arms she noted that the nipple was tilting sideways and seemed a little bit of gathered. She didn't think much of this however. It was not until July that she noted a palpable mass in the lateral right breast. She brought this to her gynecologist's attention and she was set up for diagnostic mammography at [facility] 08/04/2014.   There was an area of distortion in the lateral aspect of the right breast noted on mammography,  corresponding to a palpable firm mass at the 8:00 position of the right breast. Ultrasound confirmed a 2.4 cm irregular hypoechoic spiculated mass in the right breast at the 8:00 position and in addition at the 5:00 position 1 cm from the nipple there was a taller than wide irregular hypoechoic mass measuring 0.8 cm. There was no right axillary adenopathy. In the left breast upper-outer quadrant there was a minimally complicated cyst measuring 0.7 cm.  On 08/11/2014 the patient underwent biopsy of the 2 right breast masses noted on ultrasound. Both showed invasive ductal carcinoma, grade 1,  estrogen receptor 90% positive, progesterone receptor 90% positive, both with strong staining intensity, HER-2 equivocal at 2+ but negative by FISH, with the signals ratio being 1.20 and the number per nucleus 2.1. The proliferation fraction by MIB-1 was 17% ([id number]; this report was reviewed by our pathologist, who concurred, [name] [id number]).  On 08/25/2014 the patient underwent bilateral breast MRI at [facility]. This showed, in the right breast, an irregular enhancing mass at the 8:00 position measuring 2.7 cm. Also in the right breast at the 6:00 position there was another enhancing mass measuring 0.6 cm, located 2 cm away from the larger mass. There were also masses in the retroareolar position measuring 0.7 cm, in the posterior central right breast measuring 0.5 cm, and another one just inferior to the midline measuring 5.5 cm. There were no masses or findings of concern in the left breast and no abnormal appearing lymph nodes.  The patient's subsequent history is as detailed below  INTERVAL HISTORY: Misty [name] returns today for follow-up of her breast cancer. Since her last visit here she completed her genetics testing, which showed no mutation in the BRCA genes. She also underwent right mastectomy with sentinel lymph node sampling. The final pathology ([name] [id number]) showed 2 areas of invasive breast cancer, one measuring 2.5 cm, the other 0.9 cm. Margins were negative. Two of 6 lymph nodes sampled were involved (1 with a micrometastatic deposit, the other one with extracapsular extension). The prognostic panel was not repeated.  REVIEW OF SYSTEMS: She tolerated the surgery well, with no unusual pain, fever, rash, or bleeding. Her drains are ready out. She  is sleeping poorly, partly because of anxiety. This is not a new problem however. Occasionally she has blurred vision. Again this is more at matter of needing reading glasses, she says. She has rare headaches. There are not more frequent or  intense than before. Otherwise detailed review of systems was noncontributory today.  PAST MEDICAL HISTORY: Past Medical History  Diagnosis Date  . Thyroid disease   . Celiac disease   . PONV (postoperative nausea and vomiting)   . Hypothyroidism   . Anxiety   . Headache(784.0)     PAST SURGICAL HISTORY: Past Surgical History  Procedure Laterality Date  . Cesarean section    . Gallbladder surgery    . Cholecystectomy    . Simple mastectomy with axillary sentinel node biopsy Right 09/28/2014    Procedure: RIGHT TOTAL MASTECTOMY, RIGHT  AXILLARY SENTINEL NODE BIOPSY;  Surgeon: Fanny Skates, MD;  Location: [place];  Service: General;  Laterality: Right;    FAMILY HISTORY Family History  Problem Relation Age of Onset  . Skin cancer Father   . Breast cancer Maternal Grandmother     dx unknown age  . Breast cancer Other 81    mat great aunt through [facility] with breast cancer   the patient's parents are both living, in their late 48s. The patient had one brother, no sisters. The patient's maternal grandmother was diagnosed with breast cancer in her late 62s. One of that grandmother's sisters was also diagnosed with breast cancer, in her 75s. There is no other history of breast or ovarian cancer in the family  GYNECOLOGIC HISTORY:  Patient's last menstrual period was 09/04/2014. Menarche age 39, first live birth age 38, the patient is GX P1. She is still having regular periods. 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 Her husband, [name], works as a Engineer, structural in [place]. Their son Misty [name] is 39. The patient attends a local [place]: Not in place   HEALTH MAINTENANCE: History  Substance Use Topics  . Smoking status: Never Smoker   . Smokeless tobacco: Not on file  . Alcohol Use:  No     Colonoscopy:  PAP: 07/29/2014  Bone density:  Lipid panel:  Allergies  Allergen Reactions  . Gluten Meal   . Penicillins Nausea And Vomiting    Current Outpatient Prescriptions  Medication Sig Dispense Refill  . HYDROcodone-acetaminophen (NORCO) 5-325 MG per tablet Take 1-2 tablets by mouth every 6 (six) hours as needed for moderate pain or severe pain.  30 tablet  0  . SYNTHROID 100 MCG tablet        No current facility-administered medications for this visit.    OBJECTIVE: Middle-aged white woman in no acute distress Filed Vitals:   10/18/14 1436  BP: 105/70  Pulse: 83  Temp: 97.8 F (36.6 C)  Resp: 17     Body mass index is 22.82 kg/(m^2).    ECOG FS:1 - Symptomatic but completely ambulatory  Ocular: Sclerae unicteric, pupils round and equal Ear-nose-throat: Oropharynx clear, dentition in good repair Lymphatic: No cervical or supraclavicular adenopathy Lungs no rales or rhonchi Heart regular rate and rhythm, no murmur appreciated Abd soft, nontender, positive bowel sounds MSK no focal spinal tenderness, no upper extremity lymphedema Neuro: non-focal, well-oriented, appropriate affect Breasts: The right breast is status post recent mastectomy. The incision looks  fine--no dehiscence, swelling, erythema, or unusual tenderness. The right axilla is benign per the left breast is unremarkable.   LAB RESULTS:  CMP     Component Value Date/Time   NA 142 09/23/2014 1456   NA 140 08/26/2014 1500   K 3.9 09/23/2014 1456   K 4.7 08/26/2014 1500   CL 104 09/23/2014 1456   CO2 27 09/23/2014 1456   CO2 26 08/26/2014 1500   GLUCOSE 71 09/23/2014 1456   GLUCOSE 79 08/26/2014 1500   BUN 11 09/23/2014 1456   BUN 10.3 08/26/2014 1500   CREATININE 0.70 09/23/2014 1456   CREATININE 0.8 08/26/2014 1500   CALCIUM 9.8 09/23/2014 1456   CALCIUM 9.5 08/26/2014 1500   PROT 7.7 09/23/2014 1456   PROT 7.4 08/26/2014 1500   ALBUMIN 4.0 09/23/2014 1456   ALBUMIN 4.1 08/26/2014 1500   AST 19 09/23/2014  1456   AST 18 08/26/2014 1500   ALT 12 09/23/2014 1456   ALT 13 08/26/2014 1500   ALKPHOS 60 09/23/2014 1456   ALKPHOS 58 08/26/2014 1500   BILITOT 0.5 09/23/2014 1456   BILITOT 0.48 08/26/2014 1500   GFRNONAA >90 09/23/2014 1456   GFRAA >90 09/23/2014 1456    I No results found for this basename: SPEP,  UPEP,   kappa and lambda light chains    Lab Results  Component Value Date   WBC 6.8 09/23/2014   NEUTROABS 4.8 09/23/2014   HGB 13.5 09/23/2014   HCT 39.9 09/23/2014   MCV 93.0 09/23/2014   PLT 184 09/23/2014      Chemistry      Component Value Date/Time   NA 142 09/23/2014 1456   NA 140 08/26/2014 1500   K 3.9 09/23/2014 1456   K 4.7 08/26/2014 1500   CL 104 09/23/2014 1456   CO2 27 09/23/2014 1456   CO2 26 08/26/2014 1500   BUN 11 09/23/2014 1456   BUN 10.3 08/26/2014 1500   CREATININE 0.70 09/23/2014 1456   CREATININE 0.8 08/26/2014 1500      Component Value Date/Time   CALCIUM 9.8 09/23/2014 1456   CALCIUM 9.5 08/26/2014 1500   ALKPHOS 60 09/23/2014 1456   ALKPHOS 58 08/26/2014 1500   AST 19 09/23/2014 1456   AST 18 08/26/2014 1500   ALT 12 09/23/2014 1456   ALT 13 08/26/2014 1500   BILITOT 0.5 09/23/2014 1456   BILITOT 0.48 08/26/2014 1500       No results found for this basename: LABCA2    No components found with this basename: LABCA125    No results found for this basename: INR,  in the last 168 hours  Urinalysis No results found for this basename: colorurine,  appearanceur,  labspec,  phurine,  glucoseu,  hgbur,  bilirubinur,  ketonesur,  proteinur,  urobilinogen,  nitrite,  leukocytesur    STUDIES: Nm Sentinel Node Inj-no Rpt (breast)  09/28/2014   CLINICAL DATA: Cancer right breast   Sulfur colloid was injected intradermally by the nuclear medicine  technologist for breast cancer sentinel node localization.     ASSESSMENT: 38 y.o. [place], [place] woman status post biopsy of 2 separate right breast masses 08/11/2014, both positive for an invasive ductal carcinoma, grade 1, estrogen  and progesterone receptor strongly positive, with an MIB-1 of 19%, and HER-2 nonamplified  (1) genetics testing sent 08/26/2014, showing no mutations in the BRCA genes; there were also no mutations in a TM, BARD 1, BRIP1, [place] 1, CHE K2, MR E11A, M UT YH, N BN,  NF1, PA L B2, PTE N, RAD 50, RAD 50 1C, RAD 50 1D, and T p53.  (2) status post right mastectomy with sentinel lymph node sampling 09/28/2014 for an mpT2 pN1, stage IIB invasive breast cancer (with both ductal and lobular features), grade 2, with negative margins  (3) adjuvant chemotherapy to consist of doxorubicin and cyclophosphamide and dose dense fashion 4 with Neulasta support, beginning 11/11/2014, to be followed by paclitaxel  (4) radiation to follow chemotherapy.  (5) anti-estrogens to follow radiation  (6) history of celiac disease  PLAN: I spent 55 minutes today with Misty [name] and her husband going over her situation in detail. We originally intended to send an Oncotype, but since she is lymph node positive she understands she will need chemotherapy as per NCCN guidelines. The Oncotype would not help [place] in that regard.  She understands the chemotherapy for breast cancer is very standardized and in her case she will receive doxorubicin and cyclophosphamide in dose dense fashion 4, with Neulasta support. She will then either receive Taxol in the same way or go for weekly Taxol 12. She will be able to make that decision at the end of the initial part of her treatment.  We discussed the possible toxicities, side effects and complications of therapy. The plan will be to start chemotherapy November 19. She will need an echocardiogram and a port. She will need to come to chemotherapy school. She will see [place] again in November 16 to go over how to take her antinausea and other supportive medicines, and then we will pretty much see her on a weekly basis while she gets through her Cytoxan and Adriamycin treatment. She will see me specifically  before's moving on to the [place] has a good understanding of the overall plan. She agrees with it. She knows the goal of treatment in her case is cure. She will call with any problems that may develop before her next visit here.  Chauncey Cruel, MD   10/18/2014 2:38 PM

## 2014-10-19 ENCOUNTER — Ambulatory Visit: Payer: PRIVATE HEALTH INSURANCE | Admitting: Nutrition

## 2014-10-19 ENCOUNTER — Ambulatory Visit: Payer: BC Managed Care – PPO | Attending: General Surgery | Admitting: Physical Therapy

## 2014-10-19 ENCOUNTER — Telehealth: Payer: Self-pay | Admitting: *Deleted

## 2014-10-19 ENCOUNTER — Telehealth: Payer: Self-pay | Admitting: Nurse Practitioner

## 2014-10-19 ENCOUNTER — Other Ambulatory Visit (INDEPENDENT_AMBULATORY_CARE_PROVIDER_SITE_OTHER): Payer: Self-pay | Admitting: General Surgery

## 2014-10-19 DIAGNOSIS — M25619 Stiffness of unspecified shoulder, not elsewhere classified: Secondary | ICD-10-CM | POA: Insufficient documentation

## 2014-10-19 DIAGNOSIS — I972 Postmastectomy lymphedema syndrome: Secondary | ICD-10-CM | POA: Insufficient documentation

## 2014-10-19 NOTE — Progress Notes (Signed)
38 year old female diagnosed with breast cancer.  She is a patient of Dr. Jana Hakim.  Past medical history includes thyroid disease, postop nausea, vomiting, anxiety, and celiac disease.  Medications include Synthroid.  Labs were reviewed.  Height: 61 inches. Weight: 121 pounds. Usual body weight: 120-125 pounds. BMI: 22.95.  Patient seeking information on how to eat during chemotherapy.  She has no nutrition impact symptoms at this time.  She will begin chemotherapy in November.  She reports she has a history of vitamin D deficiency.  She has a clear understanding of her gluten free diet for celiac disease.  Nutrition diagnosis: Food and nutrition related knowledge deficit related to diagnosis of breast cancer as evidenced by no prior need for nutrition related information.  Intervention: Patient educated to consume small amounts of food frequently throughout the day to promote weight maintenance.   Educated patient on foods containing protein.   Recommended patient consume a plant-based diet consisting of fruits, vegetables and gluten free grains daily.   Fact Sheets were provided.  Questions were answered.  Teach back method used.   Nutrition diagnosis resolved.  Monitoring, evaluation, goals: Patient will tolerate adequate calories and protein for minimal Nutrition impact symptoms and weight maintenance.  No followup needs at this time.  **Disclaimer: This note was dictated with voice recognition software. Similar sounding words can inadvertently be transcribed and this note may contain transcription errors which may not have been corrected upon publication of note.**

## 2014-10-19 NOTE — Telephone Encounter (Signed)
Per staff message and POF I have scheduled appts. Advised scheduler of appts. JMW  

## 2014-10-19 NOTE — Telephone Encounter (Signed)
, °

## 2014-10-19 NOTE — Telephone Encounter (Signed)
I have adjusted 12/3 appt

## 2014-10-20 ENCOUNTER — Encounter (HOSPITAL_BASED_OUTPATIENT_CLINIC_OR_DEPARTMENT_OTHER): Payer: Self-pay | Admitting: *Deleted

## 2014-10-20 NOTE — H&P (Signed)
  Misty [name]  Location: [facility] Patient #: [id number] DOB: 02-Oct-1976 Married / Language: English / Race: White Female     History of Present Illness  Patient words: drain tubes.  The patient is a 38 year old female who presents with breast cancer. This patient returns for her first postop visit following definitive surgery for right breast cancer. She had multifocal cancer of the right breast, receptor positive, HER-2 negative. On 09/28/2014 she underwent right total mastectomy and sentinel node biopsy. Final pathology report shows 2 separate tumors, a 2.5 cm tumor and a 9 mm tumor. Margins are negative. There were 6 total nodes, and 1 sentinel node and one non-sentinel node had microscopic metastasis. So to out of 6 lymph nodes at metastasis. I discussed the pathology report returned her husband at length.i gave her a copy of the pathology report. She has 2 drains in both are draining less than 7.5 cc per day. She has some numbness under her right arm but can move her arm around quite well. I gave her a prescription for postmastectomy bra and prosthesis.   Vitals  10/11/2014 1:44 PM Weight: 119.13 lb Height: 61in Body Surface Area: 1.52 m Body Mass Index: 22.51 kg/m Temp.: 97.30F  Pulse: 72 (Regular)  BP: 104/60 (Sitting, Left Arm, Standard)    Physical Exam  General Note: Patient is alert, pleasant, cooperative. A little tremulous and a little sad. Husband is with her.   Breast Note: Right mastectomy skin flaps are healthy. No necrosis or infection. No hematoma or seroma. I removed both of the drains and redressed the wound.  Lungs Clear to auscultation bilaterally  Heart RRR,no ectopy or murmer. Radial pulses full.   Assessment & Plan CANCER OF CENTRAL PORTION OF RIGHT BREAST (174.1  C50.111) Impression: Satisfactory progress 2 weeks postop right total mastectomy and right axillary sentinel node biopsy Both drains removed  today Referred to physical therapy Wound care and activities discussed She has an appointment see Dr. Jana Hakim to decide about chemotherapy. ADDENDUM:  Chemo advised. Port requested. Prescription for post mastectomy bra and prosthesis given Return to see me in 3 weeks She is not ready to return to work. I told her she can return to work 14 days from now Current Plans  Follow up in 3 weeks or as needed   [place]. Dalbert Batman, M.D., [facility] General and Minimally invasive Surgery Breast and Colorectal Surgery Office:   [phone] Pager:   [phone]

## 2014-10-20 NOTE — Addendum Note (Signed)
Addended by: Laureen Abrahams on: 10/20/2014 05:49 PM   Modules accepted: Medications

## 2014-10-21 ENCOUNTER — Encounter: Payer: BC Managed Care – PPO | Admitting: Nutrition

## 2014-10-22 ENCOUNTER — Encounter (HOSPITAL_BASED_OUTPATIENT_CLINIC_OR_DEPARTMENT_OTHER): Payer: Self-pay | Admitting: Anesthesiology

## 2014-10-22 ENCOUNTER — Ambulatory Visit (HOSPITAL_BASED_OUTPATIENT_CLINIC_OR_DEPARTMENT_OTHER)
Admission: RE | Admit: 2014-10-22 | Discharge: 2014-10-22 | Disposition: A | Discharge status: Home / Self Care | Payer: BC Managed Care – PPO | Source: Ambulatory Visit | Attending: General Surgery | Admitting: General Surgery

## 2014-10-22 ENCOUNTER — Encounter (HOSPITAL_BASED_OUTPATIENT_CLINIC_OR_DEPARTMENT_OTHER)
Admission: RE | Disposition: A | Discharge status: Home / Self Care | Payer: Self-pay | Source: Ambulatory Visit | Attending: General Surgery

## 2014-10-22 ENCOUNTER — Encounter (HOSPITAL_BASED_OUTPATIENT_CLINIC_OR_DEPARTMENT_OTHER): Payer: BC Managed Care – PPO | Admitting: Anesthesiology

## 2014-10-22 ENCOUNTER — Ambulatory Visit (HOSPITAL_BASED_OUTPATIENT_CLINIC_OR_DEPARTMENT_OTHER): Payer: BC Managed Care – PPO | Admitting: Anesthesiology

## 2014-10-22 ENCOUNTER — Ambulatory Visit (HOSPITAL_COMMUNITY): Payer: BC Managed Care – PPO

## 2014-10-22 DIAGNOSIS — Z803 Family history of malignant neoplasm of breast: Secondary | ICD-10-CM | POA: Insufficient documentation

## 2014-10-22 DIAGNOSIS — Z170 Estrogen receptor positive status [ER+]: Secondary | ICD-10-CM | POA: Diagnosis not present

## 2014-10-22 DIAGNOSIS — K900 Celiac disease: Secondary | ICD-10-CM | POA: Diagnosis not present

## 2014-10-22 DIAGNOSIS — Z95828 Presence of other vascular implants and grafts: Secondary | ICD-10-CM

## 2014-10-22 DIAGNOSIS — C50511 Malignant neoplasm of lower-outer quadrant of right female breast: Secondary | ICD-10-CM | POA: Diagnosis present

## 2014-10-22 HISTORY — PX: PORTACATH PLACEMENT: SHX2246

## 2014-10-22 HISTORY — DX: Malignant (primary) neoplasm, unspecified: C80.1

## 2014-10-22 LAB — POCT HEMOGLOBIN-HEMACUE: HEMOGLOBIN: 14.4 g/dL (ref 12.0–15.0)

## 2014-10-22 SURGERY — INSERTION, TUNNELED CENTRAL VENOUS DEVICE, WITH PORT
Anesthesia: General | Site: Chest | Laterality: Right

## 2014-10-22 MED ORDER — HYDROMORPHONE HCL 1 MG/ML IJ SOLN
0.2500 mg | INTRAMUSCULAR | Status: DC | PRN
  Administered 2014-10-22: 0.25 mg via INTRAVENOUS
  Administered 2014-10-22: 0.5 mg via INTRAVENOUS
  Administered 2014-10-22: 0.25 mg via INTRAVENOUS

## 2014-10-22 MED ORDER — SODIUM CHLORIDE 0.9 % IJ SOLN: 3.0000 mL | Freq: Two times a day (BID) | INTRAMUSCULAR | Status: DC

## 2014-10-22 MED ORDER — HEPARIN SOD (PORK) LOCK FLUSH 100 UNIT/ML IV SOLN
INTRAVENOUS | Status: AC
  Filled 2014-10-22: qty 5

## 2014-10-22 MED ORDER — OXYCODONE HCL 5 MG PO TABS: 5.0000 mg | ORAL_TABLET | Freq: Once | ORAL | Status: DC | PRN

## 2014-10-22 MED ORDER — PROPOFOL 10 MG/ML IV BOLUS
INTRAVENOUS | Status: DC | PRN
  Administered 2014-10-22: 150 mg via INTRAVENOUS
  Administered 2014-10-22: 50 mg via INTRAVENOUS

## 2014-10-22 MED ORDER — PROPOFOL 10 MG/ML IV EMUL
INTRAVENOUS | Status: AC
  Filled 2014-10-22: qty 50

## 2014-10-22 MED ORDER — ACETAMINOPHEN 650 MG RE SUPP: 650.0000 mg | RECTAL | Status: DC | PRN

## 2014-10-22 MED ORDER — HYDROCODONE-ACETAMINOPHEN 5-325 MG PO TABS: 1.0000 | ORAL_TABLET | Freq: Four times a day (QID) | ORAL | Status: DC | PRN

## 2014-10-22 MED ORDER — LIDOCAINE HCL (CARDIAC) 20 MG/ML IV SOLN
INTRAVENOUS | Status: DC | PRN
  Administered 2014-10-22: 100 mg via INTRAVENOUS

## 2014-10-22 MED ORDER — FENTANYL CITRATE 0.05 MG/ML IJ SOLN: 50.0000 ug | INTRAMUSCULAR | Status: DC | PRN

## 2014-10-22 MED ORDER — SODIUM CHLORIDE 0.9 % IV SOLN: 250.0000 mL | INTRAVENOUS | Status: DC | PRN

## 2014-10-22 MED ORDER — LACTATED RINGERS IV SOLN: INTRAVENOUS | Status: DC

## 2014-10-22 MED ORDER — CEFAZOLIN SODIUM-DEXTROSE 2-3 GM-% IV SOLR
INTRAVENOUS | Status: AC
  Filled 2014-10-22: qty 50

## 2014-10-22 MED ORDER — SODIUM CHLORIDE 0.9 % IV SOLN: INTRAVENOUS | Status: DC

## 2014-10-22 MED ORDER — SODIUM CHLORIDE 0.9 % IJ SOLN: 3.0000 mL | INTRAMUSCULAR | Status: DC | PRN

## 2014-10-22 MED ORDER — CEFAZOLIN SODIUM-DEXTROSE 2-3 GM-% IV SOLR
2.0000 g | INTRAVENOUS | Status: AC
  Administered 2014-10-22: 2 g via INTRAVENOUS

## 2014-10-22 MED ORDER — HYDROMORPHONE HCL 1 MG/ML IJ SOLN
INTRAMUSCULAR | Status: AC
  Filled 2014-10-22: qty 1

## 2014-10-22 MED ORDER — MEPERIDINE HCL 25 MG/ML IJ SOLN: 6.2500 mg | INTRAMUSCULAR | Status: DC | PRN

## 2014-10-22 MED ORDER — HEPARIN (PORCINE) IN NACL 2-0.9 UNIT/ML-% IJ SOLN
INTRAMUSCULAR | Status: DC | PRN
  Administered 2014-10-22: 500 mL via INTRAVENOUS

## 2014-10-22 MED ORDER — LACTATED RINGERS IV SOLN
INTRAVENOUS | Status: DC | PRN
  Administered 2014-10-22 (×2): via INTRAVENOUS

## 2014-10-22 MED ORDER — EPHEDRINE SULFATE 50 MG/ML IJ SOLN
INTRAMUSCULAR | Status: DC | PRN
  Administered 2014-10-22: 10 mg via INTRAVENOUS

## 2014-10-22 MED ORDER — CHLORHEXIDINE GLUCONATE 4 % EX LIQD: 1.0000 "application " | Freq: Once | CUTANEOUS | Status: DC

## 2014-10-22 MED ORDER — BUPIVACAINE-EPINEPHRINE (PF) 0.5% -1:200000 IJ SOLN
INTRAMUSCULAR | Status: DC | PRN
Start: 2014-10-22 — End: 2014-10-22
  Administered 2014-10-22: 10 mL

## 2014-10-22 MED ORDER — MIDAZOLAM HCL 2 MG/2ML IJ SOLN
INTRAMUSCULAR | Status: AC
  Filled 2014-10-22: qty 2

## 2014-10-22 MED ORDER — DEXAMETHASONE SODIUM PHOSPHATE 4 MG/ML IJ SOLN
INTRAMUSCULAR | Status: DC | PRN
  Administered 2014-10-22: 10 mg via INTRAVENOUS

## 2014-10-22 MED ORDER — HEPARIN (PORCINE) IN NACL 2-0.9 UNIT/ML-% IJ SOLN
INTRAMUSCULAR | Status: AC
  Filled 2014-10-22: qty 500

## 2014-10-22 MED ORDER — HEPARIN SOD (PORK) LOCK FLUSH 100 UNIT/ML IV SOLN
INTRAVENOUS | Status: DC | PRN
  Administered 2014-10-22: 500 [IU] via INTRAVENOUS

## 2014-10-22 MED ORDER — MIDAZOLAM HCL 2 MG/2ML IJ SOLN: 1.0000 mg | INTRAMUSCULAR | Status: DC | PRN

## 2014-10-22 MED ORDER — MIDAZOLAM HCL 5 MG/5ML IJ SOLN
INTRAMUSCULAR | Status: DC | PRN
  Administered 2014-10-22: 2 mg via INTRAVENOUS

## 2014-10-22 MED ORDER — ONDANSETRON HCL 4 MG/2ML IJ SOLN
INTRAMUSCULAR | Status: AC
  Filled 2014-10-22: qty 2

## 2014-10-22 MED ORDER — OXYCODONE HCL 5 MG PO TABS: 5.0000 mg | ORAL_TABLET | ORAL | Status: DC | PRN

## 2014-10-22 MED ORDER — FENTANYL CITRATE 0.05 MG/ML IJ SOLN
INTRAMUSCULAR | Status: DC | PRN
  Administered 2014-10-22: 100 ug via INTRAVENOUS

## 2014-10-22 MED ORDER — OXYCODONE HCL 5 MG/5ML PO SOLN: 5.0000 mg | Freq: Once | ORAL | Status: DC | PRN

## 2014-10-22 MED ORDER — DIPHENHYDRAMINE HCL 50 MG/ML IJ SOLN
INTRAMUSCULAR | Status: DC | PRN
  Administered 2014-10-22: 6.25 mg via INTRAVENOUS

## 2014-10-22 MED ORDER — LACTATED RINGERS IV SOLN
INTRAVENOUS | Status: DC
  Administered 2014-10-22: 10:00:00 via INTRAVENOUS

## 2014-10-22 MED ORDER — ONDANSETRON HCL 4 MG/2ML IJ SOLN
4.0000 mg | Freq: Once | INTRAMUSCULAR | Status: AC | PRN
  Administered 2014-10-22: 4 mg via INTRAVENOUS

## 2014-10-22 MED ORDER — FENTANYL CITRATE 0.05 MG/ML IJ SOLN
INTRAMUSCULAR | Status: AC
  Filled 2014-10-22: qty 4

## 2014-10-22 MED ORDER — ACETAMINOPHEN 325 MG PO TABS: 650.0000 mg | ORAL_TABLET | ORAL | Status: DC | PRN

## 2014-10-22 MED ORDER — FENTANYL CITRATE 0.05 MG/ML IJ SOLN: 25.0000 ug | INTRAMUSCULAR | Status: DC | PRN

## 2014-10-22 SURGICAL SUPPLY — 54 items
BAG DECANTER FOR FLEXI CONT (MISCELLANEOUS) ×3 IMPLANT
BENZOIN TINCTURE PRP APPL 2/3 (GAUZE/BANDAGES/DRESSINGS) IMPLANT
BLADE HEX COATED 2.75 (ELECTRODE) ×3 IMPLANT
BLADE SURG 15 STRL LF DISP TIS (BLADE) ×1 IMPLANT
BLADE SURG 15 STRL SS (BLADE) ×2
CANISTER SUCT 1200ML W/VALVE (MISCELLANEOUS) IMPLANT
CHLORAPREP W/TINT 26ML (MISCELLANEOUS) ×3 IMPLANT
CLOSURE WOUND 1/2 X4 (GAUZE/BANDAGES/DRESSINGS)
COVER BACK TABLE 60X90IN (DRAPES) ×3 IMPLANT
COVER MAYO STAND STRL (DRAPES) ×3 IMPLANT
COVER PROBE 5X48 (MISCELLANEOUS)
DECANTER SPIKE VIAL GLASS SM (MISCELLANEOUS) IMPLANT
DRAPE C-ARM 42X72 X-RAY (DRAPES) ×3 IMPLANT
DRAPE LAPAROSCOPIC ABDOMINAL (DRAPES) ×3 IMPLANT
DRAPE UTILITY XL STRL (DRAPES) ×3 IMPLANT
DRSG TEGADERM 2-3/8X2-3/4 SM (GAUZE/BANDAGES/DRESSINGS) IMPLANT
DRSG TEGADERM 4X10 (GAUZE/BANDAGES/DRESSINGS) IMPLANT
DRSG TEGADERM 4X4.75 (GAUZE/BANDAGES/DRESSINGS) IMPLANT
ELECT REM PT RETURN 9FT ADLT (ELECTROSURGICAL) ×3
ELECTRODE REM PT RTRN 9FT ADLT (ELECTROSURGICAL) ×1 IMPLANT
GLOVE BIOGEL PI IND STRL 7.0 (GLOVE) ×1 IMPLANT
GLOVE BIOGEL PI INDICATOR 7.0 (GLOVE) ×2
GLOVE ECLIPSE 7.0 STRL STRAW (GLOVE) ×3 IMPLANT
GLOVE EUDERMIC 7 POWDERFREE (GLOVE) ×3 IMPLANT
GOWN STRL REUS W/ TWL LRG LVL3 (GOWN DISPOSABLE) ×1 IMPLANT
GOWN STRL REUS W/ TWL XL LVL3 (GOWN DISPOSABLE) ×1 IMPLANT
GOWN STRL REUS W/TWL LRG LVL3 (GOWN DISPOSABLE) ×2
GOWN STRL REUS W/TWL XL LVL3 (GOWN DISPOSABLE) ×2
IV CATH PLACEMENT UNIT 16 GA (IV SOLUTION) IMPLANT
IV KIT MINILOC 20X1 SAFETY (NEEDLE) IMPLANT
KIT BARDPORT ISP (Port) IMPLANT
KIT CVR 48X5XPRB PLUP LF (MISCELLANEOUS) IMPLANT
KIT PORT POWER 8FR ISP CVUE (Catheter) ×3 IMPLANT
LIQUID BAND (GAUZE/BANDAGES/DRESSINGS) ×3 IMPLANT
NEEDLE BLUNT 17GA (NEEDLE) IMPLANT
NEEDLE HYPO 22GX1.5 SAFETY (NEEDLE) ×3 IMPLANT
NEEDLE HYPO 25X1 1.5 SAFETY (NEEDLE) ×3 IMPLANT
PACK BASIN DAY SURGERY FS (CUSTOM PROCEDURE TRAY) ×3 IMPLANT
PENCIL BUTTON HOLSTER BLD 10FT (ELECTRODE) ×3 IMPLANT
SET SHEATH INTRODUCER 10FR (MISCELLANEOUS) IMPLANT
SHEATH COOK PEEL AWAY SET 9F (SHEATH) IMPLANT
SLEEVE SCD COMPRESS KNEE MED (MISCELLANEOUS) ×3 IMPLANT
SPONGE GAUZE 4X4 12PLY STER LF (GAUZE/BANDAGES/DRESSINGS) IMPLANT
STRIP CLOSURE SKIN 1/2X4 (GAUZE/BANDAGES/DRESSINGS) IMPLANT
SUT MNCRL AB 4-0 PS2 18 (SUTURE) ×3 IMPLANT
SUT PROLENE 2 0 CT2 30 (SUTURE) ×3 IMPLANT
SUT VICRYL 3-0 CR8 SH (SUTURE) ×3 IMPLANT
SYR 5ML LUER SLIP (SYRINGE) ×3 IMPLANT
SYRINGE 10CC LL (SYRINGE) ×3 IMPLANT
TOWEL OR 17X24 6PK STRL BLUE (TOWEL DISPOSABLE) ×3 IMPLANT
TOWEL OR NON WOVEN STRL DISP B (DISPOSABLE) ×3 IMPLANT
TUBE CONNECTING 20'X1/4 (TUBING)
TUBE CONNECTING 20X1/4 (TUBING) IMPLANT
YANKAUER SUCT BULB TIP NO VENT (SUCTIONS) IMPLANT

## 2014-10-22 NOTE — Op Note (Signed)
Patient Name:           Misty [name]   Date of Surgery:        10/22/2014  Pre op Diagnosis:      Cancer right breast  Post op Diagnosis:    Cancer right breast  Procedure:                 Insertion of 8 French PowerPort ClearVue tunneled venous vascular access device, Use of fluoroscopy for guidance and positioning  Surgeon:                     Edsel Petrin. Dalbert Batman, M.D., FACS  Assistant:                      OR staff  Operative Indications:   The patient is a 38 year old female who presents with breast cancer. This patient returns for her first postop visit following definitive surgery for right breast cancer. She had multifocal cancer of the right breast, receptor positive, HER-2 negative. On 09/28/2014 she underwent right total mastectomy and sentinel node biopsy. Final pathology report shows 2 separate tumors, a 2.5 cm tumor and a 9 mm tumor. Margins are negative. There were 6 total nodes, and 1 sentinel node and one non-sentinel node had microscopic metastasis. So two out of 6 lymph nodes at metastasis.   Stage mpT2, pN1 (IIB) Dr. Jana Hakim has evaluated her and has advised adjuvant chemotherapy. She is brought to [facility] day surgery center for Port-A-Cath insertion.   Operative Findings:       Venipuncture performed right subclavian vein with a single pass. Catheter tip appears to be at cavoatrial junction.  Procedure in Detail:          Following the induction of general LMA anesthesia the patient was positioned with a small roll behind her shoulders and arms tucked at her sides. The neck and chest was prepped and draped in a sterile fashion. Surgical timeout was performed. 0.5% Marcaine with epinephrine was used as a local infiltration anesthetic. A right subclavian venipuncture was performed with good blood return. The guidewire was inserted. Fluoroscopy showed that the guidewire went up into the internal jugular vein. The patient was repositioned and the wire was withdrawn and then steered  down into the superior ena cava. A small incision was made at the wire insertion site. Transverse incision was made 2 cm below the mid clavicle on the right.A subcutaneous  pocket was created. Using a tunneling device passed the catheter from the wire insertion site to the port pocket site. Using fluoroscopy I created a template on the chest wall to help position the catheter in the superior vena cava near the right atrium. The catheter was then measured and cut 22 cm in length. The catheter was secured to the port with the locking device, flushed with heparinized saline and then the port was sutured to the pectoralis fascia with 3 interrupted sutures of 2-0 Prolene. The dilator assembly was passed over the guidewire into the central venous circulation, the guidewire and dilator were removed and the catheter threaded through the sheath and the peel-away sheath removed. The catheter flushed easily and had excellent blood return. Fluoroscopy confirmed that the catheter tip was in the superior vena cava at the right atrial junction. There is no deformity of the catheter anywhere along its course. The port and catheter were then flushed with concentrated heparin. Subcutaneous tissue was closed with 3-0 Vicryl sutures and the  skin closed with running 4-0 Monocryl suture and Dermabond. The patient tolerated the procedure well was taken to PACU in stable condition. EBL 10 mL. Counts correct. Complications none. The chest x-ray will be obtained.     Edsel Petrin. Dalbert Batman, M.D., FACS General and Minimally Invasive Surgery Breast and Colorectal Surgery  10/22/2014 11:36 AM

## 2014-10-22 NOTE — Anesthesia Procedure Notes (Signed)
Procedure Name: LMA Insertion Date/Time: 10/22/2014 10:38 AM Performed by: Lyndee Leo Pre-anesthesia Checklist: Patient identified, Emergency Drugs available, Suction available and Patient being monitored Patient Re-evaluated:Patient Re-evaluated prior to inductionOxygen Delivery Method: Circle System Utilized Preoxygenation: Pre-oxygenation with 100% oxygen Intubation Type: IV induction Ventilation: Mask ventilation without difficulty LMA: LMA inserted LMA Size: 4.0 Number of attempts: 1 Airway Equipment and Method: bite block Placement Confirmation: positive ETCO2 Tube secured with: Tape Dental Injury: Teeth and Oropharynx as per pre-operative assessment

## 2014-10-22 NOTE — Interval H&P Note (Signed)
History and Physical Interval Note:  10/22/2014 9:57 AM  Misty [name]  has presented today for surgery, with the diagnosis of breast cancer   The goals and the various methods of treatment have been discussed with the patient and family. After consideration of risks, benefits and other options for treatment, the patient has consented to  Procedure(s): INSERTION PORT-A-CATH (N/A) as a surgical intervention .  The patient's history has been reviewed, patient examined today, no change in status, stable for surgery.  I have reviewed the patient's chart and labs.  Questions were answered to the patient's satisfaction.     Adin Hector

## 2014-10-22 NOTE — Anesthesia Preprocedure Evaluation (Signed)
Anesthesia Evaluation  Patient identified by MRN, date of birth, ID band Patient awake    Reviewed: Allergy & Precautions, H&P , NPO status , Patient's Chart, lab work & pertinent test results  History of Anesthesia Complications (+) PONV  Airway Mallampati: I  TM Distance: >3 FB Neck ROM: Full    Dental   Pulmonary          Cardiovascular     Neuro/Psych Anxiety    GI/Hepatic   Endo/Other    Renal/GU      Musculoskeletal   Abdominal   Peds  Hematology   Anesthesia Other Findings   Reproductive/Obstetrics                             Anesthesia Physical Anesthesia Plan  ASA: II  Anesthesia Plan: General   Post-op Pain Management:    Induction: Intravenous  Airway Management Planned: LMA  Additional Equipment:   Intra-op Plan:   Post-operative Plan: Extubation in OR  Informed Consent: I have reviewed the patients History and Physical, chart, labs and discussed the procedure including the risks, benefits and alternatives for the proposed anesthesia with the patient or authorized representative who has indicated his/her understanding and acceptance.     Plan Discussed with: CRNA and Surgeon  Anesthesia Plan Comments:         Anesthesia Quick Evaluation

## 2014-10-22 NOTE — Anesthesia Postprocedure Evaluation (Signed)
  Anesthesia Post-op Note  Patient: Misty [name]  Procedure(s) Performed: Procedure(s): INSERTION PORT-A-CATH (Right)  Patient Location: PACU  Anesthesia Type: General   Level of Consciousness: awake, alert  and oriented  Airway and Oxygen Therapy: Patient Spontanous Breathing  Post-op Pain: mild  Post-op Assessment: Post-op Vital signs reviewed  Post-op Vital Signs: Reviewed  Last Vitals:  Filed Vitals:   10/22/14 1430  BP: 110/57  Pulse: 99  Temp: 36.8 C  Resp: 18    Complications: No apparent anesthesia complications

## 2014-10-22 NOTE — Discharge Instructions (Signed)
° ° °PORT-A-CATH: POST OP INSTRUCTIONS ° °Always review your discharge instruction sheet given to you by the facility where your surgery was performed.  ° °1. A prescription for pain medication may be given to you upon discharge. Take your pain medication as prescribed, if needed. If narcotic pain medicine is not needed, then you make take acetaminophen (Tylenol) or ibuprofen (Advil) as needed.  °2. Take your usually prescribed medications unless otherwise directed. °3. If you need a refill on your pain medication, please contact our office. All narcotic pain medicine now requires a paper prescription.  Phoned in and fax refills are no longer allowed by law.  Prescriptions will not be filled after 5 pm or on weekends.  °4. You should follow a light diet for the remainder of the day after your procedure. °5. Most patients will experience some mild swelling and/or bruising in the area of the incision. It may take several days to resolve. °6. It is common to experience some constipation if taking pain medication after surgery. Increasing fluid intake and taking a stool softener (such as Colace) will usually help or prevent this problem from occurring. A mild laxative (Milk of Magnesia or Miralax) should be taken according to package directions if there are no bowel movements after 48 hours.  °7. Unless discharge instructions indicate otherwise, you may remove your bandages 48 hours after surgery, and you may shower at that time. You may have steri-strips (small white skin tapes) in place directly over the incision.  These strips should be left on the skin for 7-10 days.  If your surgeon used Dermabond (skin glue) on the incision, you may shower in 24 hours.  The glue will flake off over the next 2-3 weeks.  °8. If your port is left accessed at the end of surgery (needle left in port), the dressing cannot get wet and should only by changed by a healthcare professional. When the port is no longer accessed (when the  needle has been removed), follow step 7.   °9. ACTIVITIES:  Limit activity involving your arms for the next 72 hours. Do no strenuous exercise or activity for 1 week. You may drive when you are no longer taking prescription pain medication, you can comfortably wear a seatbelt, and you can maneuver your car. °10.You may need to see your doctor in the office for a follow-up appointment.  Please °      check with your doctor.  °11.When you receive a new Port-a-Cath, you will get a product guide and  °      ID card.  Please keep them in case you need them. ° °WHEN TO CALL YOUR DOCTOR ([phone]): °1. Fever over 101.0 °2. Chills °3. Continued bleeding from incision °4. Increased redness and tenderness at the site °5. Shortness of breath, difficulty breathing ° ° °The clinic staff is available to answer your questions during regular business hours. Please don’t hesitate to call and ask to speak to one of the nurses or medical assistants for clinical concerns. If you have a medical emergency, go to the nearest emergency room or call 911.  A surgeon from [facility] is always on call at the hospital.  ° ° ° °For further information, please visit [email or web address] ° ° °Post Anesthesia Home Care Instructions ° °Activity: °Get plenty of rest for the remainder of the day. A responsible adult should stay with you for 24 hours following the procedure.  °For the next 24 hours, DO NOT: °-Drive a car °-  Operate machinery °-Drink alcoholic beverages °-Take any medication unless instructed by your physician °-Make any legal decisions or sign important papers. ° °Meals: °Start with liquid foods such as gelatin or soup. Progress to regular foods as tolerated. Avoid greasy, spicy, heavy foods. If nausea and/or vomiting occur, drink only clear liquids until the nausea and/or vomiting subsides. Call your physician if vomiting continues. ° °Special Instructions/Symptoms: °Your throat may feel dry or sore from the  anesthesia or the breathing tube placed in your throat during surgery. If this causes discomfort, gargle with warm salt water. The discomfort should disappear within 24 hours. ° ° ° ° ° ° °

## 2014-10-22 NOTE — Transfer of Care (Signed)
Immediate Anesthesia Transfer of Care Note  Patient: Misty [name]  Procedure(s) Performed: Procedure(s): INSERTION PORT-A-CATH (Right)  Patient Location: PACU  Anesthesia Type:General  Level of Consciousness: sedated  Airway & Oxygen Therapy: Patient Spontanous Breathing and Patient connected to face mask oxygen  Post-op Assessment: Report given to PACU RN and Post -op Vital signs reviewed and stable  Post vital signs: Reviewed and stable  Complications: No apparent anesthesia complications

## 2014-10-27 ENCOUNTER — Ambulatory Visit (HOSPITAL_COMMUNITY)
Admission: RE | Admit: 2014-10-27 | Discharge: 2014-10-27 | Disposition: A | Discharge status: Home / Self Care | Payer: BC Managed Care – PPO | Source: Ambulatory Visit | Attending: Cardiovascular Disease | Admitting: Cardiovascular Disease

## 2014-10-27 ENCOUNTER — Other Ambulatory Visit: Payer: PRIVATE HEALTH INSURANCE

## 2014-10-27 ENCOUNTER — Encounter: Payer: Self-pay | Admitting: *Deleted

## 2014-10-27 DIAGNOSIS — C50511 Malignant neoplasm of lower-outer quadrant of right female breast: Secondary | ICD-10-CM | POA: Diagnosis present

## 2014-10-27 DIAGNOSIS — C50919 Malignant neoplasm of unspecified site of unspecified female breast: Secondary | ICD-10-CM

## 2014-10-27 NOTE — Progress Notes (Signed)
  Echocardiogram 2D Echocardiogram has been performed.  Misty [name] 10/27/2014, 9:17 AM

## 2014-10-28 ENCOUNTER — Telehealth: Payer: Self-pay | Admitting: Nurse Practitioner

## 2014-10-28 NOTE — Telephone Encounter (Signed)
S/w pt to advise of appt chg from 12/17 (APP Pal) to 12/16 @ 9.15am. Pt will collect a revised appt calendar at next appt on 11/16.

## 2014-10-29 ENCOUNTER — Ambulatory Visit: Payer: BC Managed Care – PPO | Admitting: Oncology

## 2014-11-01 ENCOUNTER — Encounter: Payer: Self-pay | Admitting: Oncology

## 2014-11-01 ENCOUNTER — Other Ambulatory Visit: Payer: Self-pay | Admitting: Oncology

## 2014-11-02 ENCOUNTER — Ambulatory Visit: Payer: BC Managed Care – PPO | Attending: General Surgery | Admitting: Physical Therapy

## 2014-11-02 DIAGNOSIS — I972 Postmastectomy lymphedema syndrome: Secondary | ICD-10-CM | POA: Insufficient documentation

## 2014-11-02 DIAGNOSIS — M25611 Stiffness of right shoulder, not elsewhere classified: Secondary | ICD-10-CM

## 2014-11-02 DIAGNOSIS — M25619 Stiffness of unspecified shoulder, not elsewhere classified: Secondary | ICD-10-CM | POA: Diagnosis not present

## 2014-11-02 NOTE — Patient Instructions (Signed)
Flexors Stretch, Standing   Stand near wall and slide arm up, with palm facing away from wall, by leaning toward wall. Hold ___ seconds.  Repeat ___ times per session. Do ___ sessions per day.  Copyright  VHI. All rights reserved.  Flexors Stretch, Standing   Stand near wall and slide arm up, with palm facing away from wall, by leaning toward wall. Hold _5__ seconds.  Repeat __5_ times per session. Do __3_ sessions per day.  Also do this with your right side to the wall, walking or sliding your fingers up the wall that direction.  Same instructions.  Copyright  VHI. All rights reserved.

## 2014-11-02 NOTE — Therapy (Addendum)
Physical Therapy Treatment  Patient Details  Name: Misty [name] MRN: [id number] Date of Birth: 1976-12-03  Encounter Date: 11/02/2014      PT End of Session - 11/02/14 1352    Visit Number 2   Number of Visits 8   Date for PT Re-Evaluation 12/19/14   PT Start Time 1254   PT Stop Time 8299   PT Time Calculation (min) 44 min   Equipment Utilized During Treatment Other (comment)  dowel for shoulder ROM   Activity Tolerance Patient limited by pain      Past Medical History  Diagnosis Date  . Thyroid disease   . Celiac disease   . PONV (postoperative nausea and vomiting)   . Hypothyroidism   . Anxiety   . Headache(784.0)   . Cancer     breast cancer    Past Surgical History  Procedure Laterality Date  . Cesarean section    . Gallbladder surgery    . Cholecystectomy    . Simple mastectomy with axillary sentinel node biopsy Right 09/28/2014    Procedure: RIGHT TOTAL MASTECTOMY, RIGHT  AXILLARY SENTINEL NODE BIOPSY;  Surgeon: Fanny Skates, MD;  Location: [facility];  Service: General;  Laterality: Right;  . Portacath placement Right 10/22/2014    Procedure: INSERTION PORT-A-CATH;  Surgeon: Fanny Skates, MD;  Location: [facility];  Service: General;  Laterality: Right;    LMP 09/30/2014  Visit Diagnosis:  Stiffness of joint, shoulder region, right      Subjective Assessment - 11/02/14 1341    Symptoms Right shoulder stiffness and discomfort.   Pertinent History Reports she had been doing well with right shoulder ROM HEP until Portacath placement on 10/22/14, and because of several days after that with a slight fever and not feeling well.   Patient Stated Goals Back to normal arm function.   Currently in Pain? Yes   Pain Score 2    Pain Location Axilla   Pain Orientation Right   Pain Descriptors / Indicators Tightness   Pain Onset 1 to 4 weeks ago   Pain Frequency Intermittent   Aggravating Factors  moving right arm   Pain Relieving  Factors arm at rest   Multiple Pain Sites Yes   Pain Score 2   Pain Type Surgical pain   Pain Location --  Portacath site, right chest   Pain Orientation Other (Comment);Right   Pain Descriptors / Indicators Tightness   Pain Frequency Intermittent          [facility] PT Assessment - 11/02/14 0001    Balance Screen   Has the patient fallen in the past 6 months No   Has the patient had a decrease in activity level because of a fear of falling?  No   Is the patient reluctant to leave their home because of a fear of falling?  No   AROM   Right Shoulder Extension 52 Degrees   Right Shoulder Flexion 123 Degrees   Right Shoulder ABduction 106 Degrees            PT Education - 11/02/14 1351    Education provided Yes   Education Details Reviewed HEP given last time; educated on lymphedema risk reduction practices and how to obtain a compression sleeve + when to wear it; ABC class handout used.   Person(s) Educated Patient;Spouse   Methods Explanation;Demonstration;Verbal cues;Handout   Comprehension Verbalized understanding              Plan - 11/02/14  1355    Clinical Impression Statement Pt. with improved active right shoulder flexion; other motions not improved.  Pt. feels this was doing better until Portacath placement on 10/22/14 and subsequently not feeling well for weveral days.   Pt will benefit from skilled therapeutic intervention in order to improve on the following deficits Decreased range of motion   Rehab Potential Excellent   PT Frequency Biweekly   PT Duration 6 weeks   PT Treatment/Interventions Therapeutic exercise;Patient/family education   PT Next Visit Plan Recheck right shoulder AROM; modifiy HEP if needed; if still with significant ROM limitations, suggest more frequent PT including manual therapy.   PT Home Exercise Plan Do ROM 3x/day, 5 second holds, 5 repetitions each way.   Consulted and Agree with Plan of Care Patient        Problem  List Patient Active Problem List   Diagnosis Date Noted  . Celiac disease 09/02/2014  . Family history of malignant neoplasm of breast 08/26/2014  . Breast cancer of lower-outer quadrant of right female breast 08/25/2014          Long Term Clinic Goals - 11/02/14 1358    CC Long Term Goal  #1   Title verbalize good understanding of lymphedema risk reduction practices   Time 8   Period Weeks   Status Achieved   CC Long Term Goal  #2   Title be independent with a home exercise program   Time 8   Period Weeks   Status On-going   CC Long Term Goal  #3   Title show right shoulder active flexion to at least 150 degrees   Time 8   Period Weeks   Status On-going   CC Long Term Goal  #4   Title show right shoulder active abduction to at least 150 degrees   Time 8   Period Weeks   Status On-going          [place] 11/02/2014, 2:07 PM     PHYSICAL THERAPY DISCHARGE SUMMARY  Visits from Start of Care: 1  Current functional level related to goals / functional outcomes: Unknown; she did not return after her evaluation.   Remaining deficits: Unknown   Education / Equipment: Home exercise program Plan: Patient agrees to discharge.  Patient goals were not met. Patient is being discharged due to not returning since the last visit.  ?????       Annia Friendly, PT 12/14/2015 10:00 AM

## 2014-11-05 ENCOUNTER — Other Ambulatory Visit: Payer: Self-pay | Admitting: Oncology

## 2014-11-07 ENCOUNTER — Other Ambulatory Visit: Payer: Self-pay | Admitting: Oncology

## 2014-11-08 ENCOUNTER — Other Ambulatory Visit: Payer: Self-pay | Admitting: Oncology

## 2014-11-08 ENCOUNTER — Other Ambulatory Visit (HOSPITAL_BASED_OUTPATIENT_CLINIC_OR_DEPARTMENT_OTHER): Payer: BC Managed Care – PPO

## 2014-11-08 ENCOUNTER — Encounter: Payer: Self-pay | Admitting: Nurse Practitioner

## 2014-11-08 ENCOUNTER — Ambulatory Visit (HOSPITAL_BASED_OUTPATIENT_CLINIC_OR_DEPARTMENT_OTHER): Payer: BC Managed Care – PPO | Admitting: Nurse Practitioner

## 2014-11-08 DIAGNOSIS — K900 Celiac disease: Secondary | ICD-10-CM

## 2014-11-08 DIAGNOSIS — Z803 Family history of malignant neoplasm of breast: Secondary | ICD-10-CM

## 2014-11-08 DIAGNOSIS — C50511 Malignant neoplasm of lower-outer quadrant of right female breast: Secondary | ICD-10-CM

## 2014-11-08 DIAGNOSIS — C50311 Malignant neoplasm of lower-inner quadrant of right female breast: Secondary | ICD-10-CM

## 2014-11-08 DIAGNOSIS — E079 Disorder of thyroid, unspecified: Secondary | ICD-10-CM

## 2014-11-08 LAB — CBC WITH DIFFERENTIAL/PLATELET
BASO%: 0.4 % (ref 0.0–2.0)
Basophils Absolute: 0 10*3/uL (ref 0.0–0.1)
EOS%: 6.9 % (ref 0.0–7.0)
Eosinophils Absolute: 0.5 10*3/uL (ref 0.0–0.5)
HCT: 37 % (ref 34.8–46.6)
HGB: 12.5 g/dL (ref 11.6–15.9)
LYMPH%: 23.3 % (ref 14.0–49.7)
MCH: 31.2 pg (ref 25.1–34.0)
MCHC: 33.8 g/dL (ref 31.5–36.0)
MCV: 92.3 fL (ref 79.5–101.0)
MONO#: 0.5 10*3/uL (ref 0.1–0.9)
MONO%: 7.7 % (ref 0.0–14.0)
NEUT#: 4.2 10*3/uL (ref 1.5–6.5)
NEUT%: 61.7 % (ref 38.4–76.8)
PLATELETS: 193 10*3/uL (ref 145–400)
RBC: 4.01 10*6/uL (ref 3.70–5.45)
RDW: 13.2 % (ref 11.2–14.5)
WBC: 6.8 10*3/uL (ref 3.9–10.3)
lymph#: 1.6 10*3/uL (ref 0.9–3.3)

## 2014-11-08 LAB — COMPREHENSIVE METABOLIC PANEL (CC13)
ALBUMIN: 3.9 g/dL (ref 3.5–5.0)
ALT: 15 U/L (ref 0–55)
AST: 18 U/L (ref 5–34)
Alkaline Phosphatase: 67 U/L (ref 40–150)
Anion Gap: 6 mEq/L (ref 3–11)
BILIRUBIN TOTAL: 0.54 mg/dL (ref 0.20–1.20)
BUN: 9.6 mg/dL (ref 7.0–26.0)
CO2: 27 mEq/L (ref 22–29)
Calcium: 9.6 mg/dL (ref 8.4–10.4)
Chloride: 107 mEq/L (ref 98–109)
Creatinine: 0.8 mg/dL (ref 0.6–1.1)
Glucose: 90 mg/dl (ref 70–140)
POTASSIUM: 3.7 meq/L (ref 3.5–5.1)
SODIUM: 140 meq/L (ref 136–145)
Total Protein: 6.7 g/dL (ref 6.4–8.3)

## 2014-11-08 MED ORDER — ONDANSETRON HCL 8 MG PO TABS: 8.0000 mg | ORAL_TABLET | Freq: Two times a day (BID) | ORAL | Status: DC

## 2014-11-08 MED ORDER — LIDOCAINE-PRILOCAINE 2.5-2.5 % EX CREA: 1.0000 "application " | TOPICAL_CREAM | CUTANEOUS | Status: DC | PRN

## 2014-11-08 MED ORDER — PROCHLORPERAZINE MALEATE 10 MG PO TABS: ORAL_TABLET | ORAL | Status: DC

## 2014-11-08 MED ORDER — DEXAMETHASONE 4 MG PO TABS: ORAL_TABLET | ORAL | Status: DC

## 2014-11-08 MED ORDER — LORAZEPAM 0.5 MG PO TABS: ORAL_TABLET | ORAL | Status: DC

## 2014-11-08 NOTE — Progress Notes (Signed)
[facility]  [phone] [phone]     ID: Misty [name] DOB: 1976/04/28  MR#: [id number]  WUX#:[id number]  Patient Care Team: Glenda Chroman, MD as PCP - General (Internal Medicine) Fanny Skates, MD as Consulting Physician (General Surgery) Chauncey Cruel, MD as Consulting Physician (Oncology) PCP: Glenda Chroman., MD Santo Held, MD (GYN) Crissie Reese M.D. (Plastics)  CHIEF COMPLAINT: Estrogen receptor positive breast cancer  CURRENT TREATMENT: adjuvant chemotherapy    BREAST CANCER HISTORY: From the original intake note:  [name] was closely followed for some right breast changes between 20 11/12/2012. Everything seemed stable at that time. In 2014 she was promoted in her job, was working very hard, and actually missed having a mammogram. Late in January 2015 she noted some pain in her right breast and by February when she lifted her arms she noted that the nipple was tilting sideways and seemed a little bit of gathered. She didn't think much of this however. It was not until July that she noted a palpable mass in the lateral right breast. She brought this to her gynecologist's attention and she was set up for diagnostic mammography at [facility] 08/04/2014.   There was an area of distortion in the lateral aspect of the right breast noted on mammography,  corresponding to a palpable firm mass at the 8:00 position of the right breast. Ultrasound confirmed a 2.4 cm irregular hypoechoic spiculated mass in the right breast at the 8:00 position and in addition at the 5:00 position 1 cm from the nipple there was a taller than wide irregular hypoechoic mass measuring 0.8 cm. There was no right axillary adenopathy. In the left breast upper-outer quadrant there was a minimally complicated cyst measuring 0.7 cm.  On 08/11/2014 the patient underwent biopsy of the 2 right breast masses noted on ultrasound. Both showed invasive ductal carcinoma, grade 1,  estrogen receptor 90% positive, progesterone receptor 90% positive, both with strong staining intensity, HER-2 equivocal at 2+ but negative by FISH, with the signals ratio being 1.20 and the number per nucleus 2.1. The proliferation fraction by MIB-1 was 17% ([id number]; this report was reviewed by our pathologist, who concurred, [name]).  On 08/25/2014 the patient underwent bilateral breast MRI at [place]. This showed, in the right breast, an irregular enhancing mass at the 8:00 position measuring 2.7 cm. Also in the right breast at the 6:00 position there was another enhancing mass measuring 0.6 cm, located 2 cm away from the larger mass. There were also masses in the retroareolar position measuring 0.7 cm, in the posterior central right breast measuring 0.5 cm, and another one just inferior to the midline measuring 5.5 cm. There were no masses or findings of concern in the left breast and no abnormal appearing lymph nodes.  The patient's subsequent history is as detailed below  INTERVAL HISTORY: Myrle returns today for follow-up of her breast cancer. Since her last visit here, she had a port placed to the right chest on 10/22/14. The procedure went well with no complications. She had an echocardiogram performed on 10/27/14. She attended chemotherapy school and is ready to begin adjuvant doxorubicin and cyclophosphamide this Thursday.  She demonstrates anxiety during this visit, and is mostly concerned about getting sick while on chemotherapy. Her son was confirmed to have strep throat today during an urgent care visit.   REVIEW OF SYSTEMS: Izzabell denies fevers, chills, bleeding, nausea, or vomiting. Her history is positive for Celiac's disease and she is careful  to avoid gluten or it will upset her GI tract. She denies shortness of breath, chest pain, cough, or palpitations. She is anxious about starting chemotherapy but denies depression. A detailed review of systems is otherwise  noncontributory.   PAST MEDICAL HISTORY: Past Medical History  Diagnosis Date  . Thyroid disease   . Celiac disease   . PONV (postoperative nausea and vomiting)   . Hypothyroidism   . Anxiety   . Headache(784.0)   . Cancer     breast cancer    PAST SURGICAL HISTORY: Past Surgical History  Procedure Laterality Date  . Cesarean section    . Gallbladder surgery    . Cholecystectomy    . Simple mastectomy with axillary sentinel node biopsy Right 09/28/2014    Procedure: RIGHT TOTAL MASTECTOMY, RIGHT  AXILLARY SENTINEL NODE BIOPSY;  Surgeon: Fanny Skates, MD;  Location: [facility];  Service: General;  Laterality: Right;  . Portacath placement Right 10/22/2014    Procedure: INSERTION PORT-A-CATH;  Surgeon: Fanny Skates, MD;  Location: [facility];  Service: General;  Laterality: Right;    FAMILY HISTORY Family History  Problem Relation Age of Onset  . Skin cancer Father   . Breast cancer Maternal Grandmother     dx unknown age  . Breast cancer Other 5    mat great aunt through [facility] with breast cancer   the patient's parents are both living, in their late 34s. The patient had one brother, no sisters. The patient's maternal grandmother was diagnosed with breast cancer in her late 49s. One of that grandmother's sisters was also diagnosed with breast cancer, in her 75s. There is no other history of breast or ovarian cancer in the family  GYNECOLOGIC HISTORY:  Patient's last menstrual period was 09/30/2014. Menarche age 65, first live birth age 25, the patient is GX P1. She is still having regular periods. 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 Her husband, Misty [name], works as a Engineer, structural in [place]. Their son Misty [name] is 31. The patient attends a local [employer]: Not in  place   HEALTH MAINTENANCE: History  Substance Use Topics  . Smoking status: Never Smoker   . Smokeless tobacco: Not on file  . Alcohol Use: No     Colonoscopy:  PAP: 07/29/2014  Bone density:  Lipid panel:  Allergies  Allergen Reactions  . Gluten Meal   . Penicillins Nausea And Vomiting    Current Outpatient Prescriptions  Medication Sig Dispense Refill  . HYDROcodone-acetaminophen (NORCO) 5-325 MG per tablet Take 1-2 tablets by mouth every 6 (six) hours as needed for moderate pain or severe pain. 30 tablet 0  . SYNTHROID 100 MCG tablet     . dexamethasone (DECADRON) 4 MG tablet Take 2 tablets by mouth once a day on the day after chemotherapy and then take 2 tablets two times a day for 2 days. Take with food. 30 tablet 1  . lidocaine-prilocaine (EMLA) cream Apply 1 application topically as needed. Apply over port area 1-2 hours before chemotherapy, cover with plastic wrap 30 g 0  . LORazepam (ATIVAN) 0.5 MG tablet Take at bedtime as directed 30 tablet 0  . ondansetron (ZOFRAN) 8 MG tablet Take 1 tablet (8 mg total) by mouth 2 (two) times daily. 30 tablet 1  . prochlorperazine (COMPAZINE) 10 MG  tablet Take every 6 hours by mouth as directed 30 tablet 1   No current facility-administered medications for this visit.    OBJECTIVE: Middle-aged white woman in no acute distress Filed Vitals:   11/08/14 1526  BP: 113/56  Pulse: 95  Temp: 98.3 F (36.8 C)  Resp: 18     Body mass index is 22.71 kg/(m^2).    ECOG FS:1 - Symptomatic but completely ambulatory  Skin: warm, dry  HEENT: sclerae anicteric, conjunctivae pink, oropharynx clear. No thrush or mucositis.  Lymph Nodes: No cervical or supraclavicular lymphadenopathy  Lungs: clear to auscultation bilaterally, no rales, wheezes, or rhonci  Heart: regular rate and rhythm  Abdomen: round, soft, non tender, positive bowel sounds  Musculoskeletal: No focal spinal tenderness, no peripheral edema  Neuro: non focal, well  oriented, anxious affect  Breasts: deferred. Right chest port site clean dry and intact   LAB RESULTS:  CMP     Component Value Date/Time   NA 140 11/08/2014 1430   NA 142 09/23/2014 1456   K 3.7 11/08/2014 1430   K 3.9 09/23/2014 1456   CL 104 09/23/2014 1456   CO2 27 11/08/2014 1430   CO2 27 09/23/2014 1456   GLUCOSE 90 11/08/2014 1430   GLUCOSE 71 09/23/2014 1456   BUN 9.6 11/08/2014 1430   BUN 11 09/23/2014 1456   CREATININE 0.8 11/08/2014 1430   CREATININE 0.70 09/23/2014 1456   CALCIUM 9.6 11/08/2014 1430   CALCIUM 9.8 09/23/2014 1456   PROT 6.7 11/08/2014 1430   PROT 7.7 09/23/2014 1456   ALBUMIN 3.9 11/08/2014 1430   ALBUMIN 4.0 09/23/2014 1456   AST 18 11/08/2014 1430   AST 19 09/23/2014 1456   ALT 15 11/08/2014 1430   ALT 12 09/23/2014 1456   ALKPHOS 67 11/08/2014 1430   ALKPHOS 60 09/23/2014 1456   BILITOT 0.54 11/08/2014 1430   BILITOT 0.5 09/23/2014 1456   GFRNONAA >90 09/23/2014 1456   GFRAA >90 09/23/2014 1456    I No results found for: SPEP  Lab Results  Component Value Date   WBC 6.8 11/08/2014   NEUTROABS 4.2 11/08/2014   HGB 12.5 11/08/2014   HCT 37.0 11/08/2014   MCV 92.3 11/08/2014   PLT 193 11/08/2014      Chemistry      Component Value Date/Time   NA 140 11/08/2014 1430   NA 142 09/23/2014 1456   K 3.7 11/08/2014 1430   K 3.9 09/23/2014 1456   CL 104 09/23/2014 1456   CO2 27 11/08/2014 1430   CO2 27 09/23/2014 1456   BUN 9.6 11/08/2014 1430   BUN 11 09/23/2014 1456   CREATININE 0.8 11/08/2014 1430   CREATININE 0.70 09/23/2014 1456      Component Value Date/Time   CALCIUM 9.6 11/08/2014 1430   CALCIUM 9.8 09/23/2014 1456   ALKPHOS 67 11/08/2014 1430   ALKPHOS 60 09/23/2014 1456   AST 18 11/08/2014 1430   AST 19 09/23/2014 1456   ALT 15 11/08/2014 1430   ALT 12 09/23/2014 1456   BILITOT 0.54 11/08/2014 1430   BILITOT 0.5 09/23/2014 1456       No results found for: LABCA2  No components found for: LABCA125  No  results for input(s): INR in the last 168 hours.  Urinalysis No results found for: COLORURINE  STUDIES: Most recent echocardiogram on 10/27/14 showed an ejection fraction of 50-55%  Dg Chest Port 1 View  10/22/2014   CLINICAL DATA:  Post Port-A-Cath placement age.  EXAM: PORTABLE CHEST - 1 VIEW  COMPARISON:  None.  FINDINGS: Right subclavian Port-A-Cath has been placed. The tip is near the cavoatrial junction. No pneumothorax.  Surgical clips in the right axilla. Probable right base atelectasis. Heart is normal size. Left lung is clear. No effusions.  IMPRESSION: Right subclavian Port-A-Cath tip near the cavoatrial junction. No pneumothorax.   Electronically Signed   By: Rolm Baptise M.D.   On: 10/22/2014 12:34   Dg Fluoro Guide Cv Line-no Report  10/22/2014   CLINICAL DATA:    FLOURO GUIDE CV LINE  Fluoroscopy was utilized by the requesting physician.  No radiographic  interpretation.     ASSESSMENT: 38 y.o. [place], [place] woman status post biopsy of 2 separate right breast masses 08/11/2014, both positive for an invasive ductal carcinoma, grade 1, estrogen and progesterone receptor strongly positive, with an MIB-1 of 19%, and HER-2 nonamplified  (1) genetics testing sent 08/26/2014, showing no mutations in the BRCA genes; there were also no mutations in a TM, BARD 1, BRIP1, CDH 1, CHE K2, MR E11A, M UT YH, N BN, NF1, PA L B2, PTE N, RAD 50, RAD 50 1C, RAD 50 1D, and T p53.  (2) status post right mastectomy with sentinel lymph node sampling 09/28/2014 for an mpT2 pN1, stage IIB invasive breast cancer (with both ductal and lobular features), grade 2, with negative margins  (3) adjuvant chemotherapy to consist of doxorubicin and cyclophosphamide and dose dense fashion 4 with Neulasta support, beginning 11/11/2014, to be followed by paclitaxel  (4) radiation to follow chemotherapy.  (5) anti-estrogens to follow radiation  (6) history of celiac disease  PLAN: Alyannah and I spent  approximately 35 minutes reviewing the anti-emetic "road map" and common side effects and symptoms she may encounter while undergoing chemotherapy. She was able to perform the "teach back" method of explaining back to me the timing and dosage of her medications. These meds were e-prescribed to her pharmacy during this visit. I feel confident that she will use the prescribed medications appropriately. We discussed her schedule for the upcoming weeks, including what to expect during injection appointments. Arabelle will begin dose dense doxorubicin and cyclophosphamide this Thursday.   Marella will return next week for labs and a nadir visit with Dr. Jana Hakim. She understands and agrees with this plan. She knows the goal of treatment in her case is cure. She has been encouraged to call with any issues that might arise before her next visit here.  Marcelino Duster, NP   11/08/2014 4:00 PM

## 2014-11-11 ENCOUNTER — Ambulatory Visit (HOSPITAL_BASED_OUTPATIENT_CLINIC_OR_DEPARTMENT_OTHER): Payer: BC Managed Care – PPO

## 2014-11-11 DIAGNOSIS — Z803 Family history of malignant neoplasm of breast: Secondary | ICD-10-CM

## 2014-11-11 DIAGNOSIS — C50511 Malignant neoplasm of lower-outer quadrant of right female breast: Secondary | ICD-10-CM

## 2014-11-11 DIAGNOSIS — K900 Celiac disease: Secondary | ICD-10-CM

## 2014-11-11 DIAGNOSIS — Z5111 Encounter for antineoplastic chemotherapy: Secondary | ICD-10-CM

## 2014-11-11 MED ORDER — SODIUM CHLORIDE 0.9 % IV SOLN
Freq: Once | INTRAVENOUS | Status: AC
  Administered 2014-11-11: 09:00:00 via INTRAVENOUS

## 2014-11-11 MED ORDER — PALONOSETRON HCL INJECTION 0.25 MG/5ML
0.2500 mg | Freq: Once | INTRAVENOUS | Status: AC
  Administered 2014-11-11: 0.25 mg via INTRAVENOUS

## 2014-11-11 MED ORDER — HEPARIN SOD (PORK) LOCK FLUSH 100 UNIT/ML IV SOLN
500.0000 [IU] | Freq: Once | INTRAVENOUS | Status: AC | PRN
  Administered 2014-11-11: 500 [IU]
  Filled 2014-11-11: qty 5

## 2014-11-11 MED ORDER — DEXAMETHASONE SODIUM PHOSPHATE 20 MG/5ML IJ SOLN
12.0000 mg | Freq: Once | INTRAMUSCULAR | Status: AC
  Administered 2014-11-11: 12 mg via INTRAVENOUS

## 2014-11-11 MED ORDER — DEXAMETHASONE SODIUM PHOSPHATE 20 MG/5ML IJ SOLN
INTRAMUSCULAR | Status: AC
  Filled 2014-11-11: qty 5

## 2014-11-11 MED ORDER — SODIUM CHLORIDE 0.9 % IJ SOLN
10.0000 mL | INTRAMUSCULAR | Status: DC | PRN
  Administered 2014-11-11: 10 mL
  Filled 2014-11-11: qty 10

## 2014-11-11 MED ORDER — PALONOSETRON HCL INJECTION 0.25 MG/5ML
INTRAVENOUS | Status: AC
  Filled 2014-11-11: qty 5

## 2014-11-11 MED ORDER — DOXORUBICIN HCL CHEMO IV INJECTION 2 MG/ML
60.0000 mg/m2 | Freq: Once | INTRAVENOUS | Status: AC
  Administered 2014-11-11: 92 mg via INTRAVENOUS
  Filled 2014-11-11: qty 46

## 2014-11-11 MED ORDER — SODIUM CHLORIDE 0.9 % IV SOLN
150.0000 mg | Freq: Once | INTRAVENOUS | Status: AC
  Administered 2014-11-11: 150 mg via INTRAVENOUS
  Filled 2014-11-11: qty 5

## 2014-11-11 MED ORDER — SODIUM CHLORIDE 0.9 % IV SOLN
600.0000 mg/m2 | Freq: Once | INTRAVENOUS | Status: AC
  Administered 2014-11-11: 920 mg via INTRAVENOUS
  Filled 2014-11-11: qty 46

## 2014-11-11 MED ORDER — LIDOCAINE-PRILOCAINE 2.5-2.5 % EX CREA: 1.0000 "application " | TOPICAL_CREAM | CUTANEOUS | Status: DC | PRN

## 2014-11-11 NOTE — Patient Instructions (Signed)
[facility] Discharge Instructions for Patients Receiving Chemotherapy  Today you received the following chemotherapy agents adriamycin/cytoxan  To help prevent nausea and vomiting after your treatment, we encourage you to take your nausea medication as directed   If you develop nausea and vomiting that is not controlled by your nausea medication, call the clinic.   BELOW ARE SYMPTOMS THAT SHOULD BE REPORTED IMMEDIATELY:  *FEVER GREATER THAN 100.5 F  *CHILLS WITH OR WITHOUT FEVER  NAUSEA AND VOMITING THAT IS NOT CONTROLLED WITH YOUR NAUSEA MEDICATION  *UNUSUAL SHORTNESS OF BREATH  *UNUSUAL BRUISING OR BLEEDING  TENDERNESS IN MOUTH AND THROAT WITH OR WITHOUT PRESENCE OF ULCERS  *URINARY PROBLEMS  *BOWEL PROBLEMS  UNUSUAL RASH Items with * indicate a potential emergency and should be followed up as soon as possible.  Feel free to call the clinic you have any questions or concerns. The clinic phone number is [phone].   Doxorubicin injection What is this medicine? DOXORUBICIN (dox oh ROO bi sin) is a chemotherapy drug. It is used to treat many kinds of cancer like Hodgkin's disease, leukemia, non-Hodgkin's lymphoma, neuroblastoma, sarcoma, and Wilms' tumor. It is also used to treat bladder cancer, breast cancer, lung cancer, ovarian cancer, stomach cancer, and thyroid cancer. This medicine may be used for other purposes; ask your health care provider or pharmacist if you have questions. COMMON BRAND NAME(S): Adriamycin, Adriamycin PFS, Adriamycin RDF, Rubex What should I tell my health care provider before I take this medicine? They need to know if you have any of these conditions: -blood disorders -heart disease, recent heart attack -infection (especially a virus infection such as chickenpox, cold sores, or herpes) -irregular heartbeat -liver disease -recent or ongoing radiation therapy -an unusual or allergic reaction to doxorubicin, other chemotherapy  agents, other medicines, foods, dyes, or preservatives -pregnant or trying to get pregnant -breast-feeding How should I use this medicine? This drug is given as an infusion into a vein. It is administered in a hospital or clinic by a specially trained health care professional. If you have pain, swelling, burning or any unusual feeling around the site of your injection, tell your health care professional right away. Talk to your pediatrician regarding the use of this medicine in children. Special care may be needed. Overdosage: If you think you have taken too much of this medicine contact a poison control center or emergency room at once. NOTE: This medicine is only for you. Do not share this medicine with others. What if I miss a dose? It is important not to miss your dose. Call your doctor or health care professional if you are unable to keep an appointment. What may interact with this medicine? Do not take this medicine with any of the following medications: -cisapride -droperidol -halofantrine -pimozide -zidovudine This medicine may also interact with the following medications: -chloroquine -chlorpromazine -clarithromycin -cyclophosphamide -cyclosporine -erythromycin -medicines for depression, anxiety, or psychotic disturbances -medicines for irregular heart beat like amiodarone, bepridil, dofetilide, encainide, flecainide, propafenone, quinidine -medicines for seizures like ethotoin, fosphenytoin, phenytoin -medicines for nausea, vomiting like dolasetron, ondansetron, palonosetron -medicines to increase blood counts like filgrastim, pegfilgrastim, sargramostim -methadone -methotrexate -pentamidine -progesterone -vaccines -verapamil Talk to your doctor or health care professional before taking any of these medicines: -acetaminophen -aspirin -ibuprofen -ketoprofen -naproxen This list may not describe all possible interactions. Give your health care provider a list of all  the medicines, herbs, non-prescription drugs, or dietary supplements you use. Also tell them if you smoke, drink alcohol, or use   illegal drugs. Some items may interact with your medicine. What should I watch for while using this medicine? Your condition will be monitored carefully while you are receiving this medicine. You will need important blood work done while you are taking this medicine. This drug may make you feel generally unwell. This is not uncommon, as chemotherapy can affect healthy cells as well as cancer cells. Report any side effects. Continue your course of treatment even though you feel ill unless your doctor tells you to stop. Your urine may turn red for a few days after your dose. This is not blood. If your urine is dark or brown, call your doctor. In some cases, you may be given additional medicines to help with side effects. Follow all directions for their use. Call your doctor or health care professional for advice if you get a fever, chills or sore throat, or other symptoms of a cold or flu. Do not treat yourself. This drug decreases your body's ability to fight infections. Try to avoid being around people who are sick. This medicine may increase your risk to bruise or bleed. Call your doctor or health care professional if you notice any unusual bleeding. Be careful brushing and flossing your teeth or using a toothpick because you may get an infection or bleed more easily. If you have any dental work done, tell your dentist you are receiving this medicine. Avoid taking products that contain aspirin, acetaminophen, ibuprofen, naproxen, or ketoprofen unless instructed by your doctor. These medicines may hide a fever. Men and women of childbearing age should use effective birth control methods while using taking this medicine. Do not become pregnant while taking this medicine. There is a potential for serious side effects to an unborn child. Talk to your health care professional or  pharmacist for more information. Do not breast-feed an infant while taking this medicine. Do not let others touch your urine or other body fluids for 5 days after each treatment with this medicine. Caregivers should wear latex gloves to avoid touching body fluids during this time. There is a maximum amount of this medicine you should receive throughout your life. The amount depends on the medical condition being treated and your overall health. Your doctor will watch how much of this medicine you receive in your lifetime. Tell your doctor if you have taken this medicine before. What side effects may I notice from receiving this medicine? Side effects that you should report to your doctor or health care professional as soon as possible: -allergic reactions like skin rash, itching or hives, swelling of the face, lips, or tongue -low blood counts - this medicine may decrease the number of white blood cells, red blood cells and platelets. You may be at increased risk for infections and bleeding. -signs of infection - fever or chills, cough, sore throat, pain or difficulty passing urine -signs of decreased platelets or bleeding - bruising, pinpoint red spots on the skin, black, tarry stools, blood in the urine -signs of decreased red blood cells - unusually weak or tired, fainting spells, lightheadedness -breathing problems -chest pain -fast, irregular heartbeat -mouth sores -nausea, vomiting -pain, swelling, redness at site where injected -pain, tingling, numbness in the hands or feet -swelling of ankles, feet, or hands -unusual bleeding or bruising Side effects that usually do not require medical attention (report to your doctor or health care professional if they continue or are bothersome): -diarrhea -facial flushing -hair loss -loss of appetite -missed menstrual periods -nail discoloration or damage -red   or watery eyes -red colored urine -stomach upset This list may not describe all  possible side effects. Call your doctor for medical advice about side effects. You may report side effects to FDA at 1-800-FDA-1088. Where should I keep my medicine? This drug is given in a hospital or clinic and will not be stored at home. NOTE: This sheet is a summary. It may not cover all possible information. If you have questions about this medicine, talk to your doctor, pharmacist, or health care provider.  2015, [employer] Standard. (2013-04-07 09:54:34)  Cyclophosphamide injection What is this medicine? CYCLOPHOSPHAMIDE (sye kloe FOSS fa mide) is a chemotherapy drug. It slows the growth of cancer cells. This medicine is used to treat many types of cancer like lymphoma, myeloma, leukemia, breast cancer, and ovarian cancer, to name a few. This medicine may be used for other purposes; ask your health care provider or pharmacist if you have questions. COMMON BRAND NAME(S): Cytoxan, Neosar What should I tell my health care provider before I take this medicine? They need to know if you have any of these conditions: -blood disorders -history of other chemotherapy -infection -kidney disease -liver disease -recent or ongoing radiation therapy -tumors in the bone marrow -an unusual or allergic reaction to cyclophosphamide, other chemotherapy, other medicines, foods, dyes, or preservatives -pregnant or trying to get pregnant -breast-feeding How should I use this medicine? This drug is usually given as an injection into a vein or muscle or by infusion into a vein. It is administered in a hospital or clinic by a specially trained health care professional. Talk to your pediatrician regarding the use of this medicine in children. Special care may be needed. Overdosage: If you think you have taken too much of this medicine contact a poison control center or emergency room at once. NOTE: This medicine is only for you. Do not share this medicine with others. What if I miss a dose? It is  important not to miss your dose. Call your doctor or health care professional if you are unable to keep an appointment. What may interact with this medicine? This medicine may interact with the following medications: -amiodarone -amphotericin B -azathioprine -certain antiviral medicines for HIV or AIDS such as protease inhibitors (e.g., indinavir, ritonavir) and zidovudine -certain blood pressure medications such as benazepril, captopril, enalapril, fosinopril, lisinopril, moexipril, monopril, perindopril, quinapril, ramipril, trandolapril -certain cancer medications such as anthracyclines (e.g., daunorubicin, doxorubicin), busulfan, cytarabine, paclitaxel, pentostatin, tamoxifen, trastuzumab -certain diuretics such as chlorothiazide, chlorthalidone, hydrochlorothiazide, indapamide, metolazone -certain medicines that treat or prevent blood clots like warfarin -certain muscle relaxants such as succinylcholine -cyclosporine -etanercept -indomethacin -medicines to increase blood counts like filgrastim, pegfilgrastim, sargramostim -medicines used as general anesthesia -metronidazole -natalizumab This list may not describe all possible interactions. Give your health care provider a list of all the medicines, herbs, non-prescription drugs, or dietary supplements you use. Also tell them if you smoke, drink alcohol, or use illegal drugs. Some items may interact with your medicine. What should I watch for while using this medicine? Visit your doctor for checks on your progress. This drug may make you feel generally unwell. This is not uncommon, as chemotherapy can affect healthy cells as well as cancer cells. Report any side effects. Continue your course of treatment even though you feel ill unless your doctor tells you to stop. Drink water or other fluids as directed. Urinate often, even at night. In some cases, you may be given additional medicines to help with side effects. Follow all directions   for  their use. Call your doctor or health care professional for advice if you get a fever, chills or sore throat, or other symptoms of a cold or flu. Do not treat yourself. This drug decreases your body's ability to fight infections. Try to avoid being around people who are sick. This medicine may increase your risk to bruise or bleed. Call your doctor or health care professional if you notice any unusual bleeding. Be careful brushing and flossing your teeth or using a toothpick because you may get an infection or bleed more easily. If you have any dental work done, tell your dentist you are receiving this medicine. You may get drowsy or dizzy. Do not drive, use machinery, or do anything that needs mental alertness until you know how this medicine affects you. Do not become pregnant while taking this medicine or for 1 year after stopping it. Women should inform their doctor if they wish to become pregnant or think they might be pregnant. Men should not father a child while taking this medicine and for 4 months after stopping it. There is a potential for serious side effects to an unborn child. Talk to your health care professional or pharmacist for more information. Do not breast-feed an infant while taking this medicine. This medicine may interfere with the ability to have a child. This medicine has caused ovarian failure in some women. This medicine has caused reduced sperm counts in some men. You should talk with your doctor or health care professional if you are concerned about your fertility. If you are going to have surgery, tell your doctor or health care professional that you have taken this medicine. What side effects may I notice from receiving this medicine? Side effects that you should report to your doctor or health care professional as soon as possible: -allergic reactions like skin rash, itching or hives, swelling of the face, lips, or tongue -low blood counts - this medicine may decrease the  number of white blood cells, red blood cells and platelets. You may be at increased risk for infections and bleeding. -signs of infection - fever or chills, cough, sore throat, pain or difficulty passing urine -signs of decreased platelets or bleeding - bruising, pinpoint red spots on the skin, black, tarry stools, blood in the urine -signs of decreased red blood cells - unusually weak or tired, fainting spells, lightheadedness -breathing problems -dark urine -dizziness -palpitations -swelling of the ankles, feet, hands -trouble passing urine or change in the amount of urine -weight gain -yellowing of the eyes or skin Side effects that usually do not require medical attention (report to your doctor or health care professional if they continue or are bothersome): -changes in nail or skin color -hair loss -missed menstrual periods -mouth sores -nausea, vomiting This list may not describe all possible side effects. Call your doctor for medical advice about side effects. You may report side effects to FDA at 1-800-FDA-1088. Where should I keep my medicine? This drug is given in a hospital or clinic and will not be stored at home. NOTE: This sheet is a summary. It may not cover all possible information. If you have questions about this medicine, talk to your doctor, pharmacist, or health care provider.  2015, [employer] Standard. (2012-10-24 16:22:58)  

## 2014-11-12 ENCOUNTER — Ambulatory Visit (HOSPITAL_BASED_OUTPATIENT_CLINIC_OR_DEPARTMENT_OTHER): Payer: BC Managed Care – PPO

## 2014-11-12 DIAGNOSIS — Z803 Family history of malignant neoplasm of breast: Secondary | ICD-10-CM

## 2014-11-12 DIAGNOSIS — K900 Celiac disease: Secondary | ICD-10-CM

## 2014-11-12 DIAGNOSIS — C50511 Malignant neoplasm of lower-outer quadrant of right female breast: Secondary | ICD-10-CM

## 2014-11-12 DIAGNOSIS — Z5189 Encounter for other specified aftercare: Secondary | ICD-10-CM

## 2014-11-12 MED ORDER — PEGFILGRASTIM INJECTION 6 MG/0.6ML ~~LOC~~
6.0000 mg | PREFILLED_SYRINGE | Freq: Once | SUBCUTANEOUS | Status: AC
  Administered 2014-11-12: 6 mg via SUBCUTANEOUS
  Filled 2014-11-12: qty 0.6

## 2014-11-12 NOTE — Patient Instructions (Signed)
Pegfilgrastim injection What is this medicine? PEGFILGRASTIM (peg fil GRA stim) is a long-acting granulocyte colony-stimulating factor that stimulates the growth of neutrophils, a type of white blood cell important in the body's fight against infection. It is used to reduce the incidence of fever and infection in patients with certain types of cancer who are receiving chemotherapy that affects the bone marrow. This medicine may be used for other purposes; ask your health care provider or pharmacist if you have questions. COMMON BRAND NAME(S): Neulasta What should I tell my health care provider before I take this medicine? They need to know if you have any of these conditions: -latex allergy -ongoing radiation therapy -sickle cell disease -skin reactions to acrylic adhesives (On-Body Injector only) -an unusual or allergic reaction to pegfilgrastim, filgrastim, other medicines, foods, dyes, or preservatives -pregnant or trying to get pregnant -breast-feeding How should I use this medicine? This medicine is for injection under the skin. If you get this medicine at home, you will be taught how to prepare and give the pre-filled syringe or how to use the On-body Injector. Refer to the patient Instructions for Use for detailed instructions. Use exactly as directed. Take your medicine at regular intervals. Do not take your medicine more often than directed. It is important that you put your used needles and syringes in a special sharps container. Do not put them in a trash can. If you do not have a sharps container, call your pharmacist or healthcare provider to get one. Talk to your pediatrician regarding the use of this medicine in children. Special care may be needed. Overdosage: If you think you have taken too much of this medicine contact a poison control center or emergency room at once. NOTE: This medicine is only for you. Do not share this medicine with others. What if I miss a dose? It is  important not to miss your dose. Call your doctor or health care professional if you miss your dose. If you miss a dose due to an On-body Injector failure or leakage, a new dose should be administered as soon as possible using a single prefilled syringe for manual use. What may interact with this medicine? Interactions have not been studied. Give your health care provider a list of all the medicines, herbs, non-prescription drugs, or dietary supplements you use. Also tell them if you smoke, drink alcohol, or use illegal drugs. Some items may interact with your medicine. This list may not describe all possible interactions. Give your health care provider a list of all the medicines, herbs, non-prescription drugs, or dietary supplements you use. Also tell them if you smoke, drink alcohol, or use illegal drugs. Some items may interact with your medicine. What should I watch for while using this medicine? You may need blood work done while you are taking this medicine. If you are going to need a MRI, CT scan, or other procedure, tell your doctor that you are using this medicine (On-Body Injector only). What side effects may I notice from receiving this medicine? Side effects that you should report to your doctor or health care professional as soon as possible: -allergic reactions like skin rash, itching or hives, swelling of the face, lips, or tongue -dizziness -fever -pain, redness, or irritation at site where injected -pinpoint red spots on the skin -shortness of breath or breathing problems -stomach or side pain, or pain at the shoulder -swelling -tiredness -trouble passing urine Side effects that usually do not require medical attention (report to your doctor   or health care professional if they continue or are bothersome): -bone pain -muscle pain This list may not describe all possible side effects. Call your doctor for medical advice about side effects. You may report side effects to FDA at  1-800-FDA-1088. Where should I keep my medicine? Keep out of the reach of children. Store pre-filled syringes in a refrigerator between 2 and 8 degrees C (36 and 46 degrees F). Do not freeze. Keep in carton to protect from light. Throw away this medicine if it is left out of the refrigerator for more than 48 hours. Throw away any unused medicine after the expiration date. NOTE: This sheet is a summary. It may not cover all possible information. If you have questions about this medicine, talk to your doctor, pharmacist, or health care provider.  2015, [employer] Standard. (2014-03-11 16:14:05)  

## 2014-11-16 ENCOUNTER — Ambulatory Visit: Payer: BC Managed Care – PPO | Admitting: Physical Therapy

## 2014-11-17 ENCOUNTER — Non-Acute Institutional Stay (HOSPITAL_COMMUNITY)
Admission: AD | Admit: 2014-11-17 | Discharge: 2014-11-17 | Disposition: A | Discharge status: Home / Self Care | Payer: PRIVATE HEALTH INSURANCE | Source: Ambulatory Visit | Attending: Oncology | Admitting: Oncology

## 2014-11-17 ENCOUNTER — Other Ambulatory Visit (HOSPITAL_BASED_OUTPATIENT_CLINIC_OR_DEPARTMENT_OTHER): Payer: BC Managed Care – PPO

## 2014-11-17 ENCOUNTER — Other Ambulatory Visit: Payer: Self-pay | Admitting: *Deleted

## 2014-11-17 ENCOUNTER — Ambulatory Visit (HOSPITAL_BASED_OUTPATIENT_CLINIC_OR_DEPARTMENT_OTHER): Payer: BC Managed Care – PPO | Admitting: Oncology

## 2014-11-17 ENCOUNTER — Telehealth: Payer: Self-pay | Admitting: *Deleted

## 2014-11-17 ENCOUNTER — Telehealth: Payer: Self-pay | Admitting: Oncology

## 2014-11-17 VITALS — BP 117/68 | HR 81 | Temp 98.1°F | Resp 20 | Ht 61.0 in | Wt 115.7 lb

## 2014-11-17 DIAGNOSIS — C50511 Malignant neoplasm of lower-outer quadrant of right female breast: Secondary | ICD-10-CM

## 2014-11-17 DIAGNOSIS — Z95828 Presence of other vascular implants and grafts: Secondary | ICD-10-CM

## 2014-11-17 DIAGNOSIS — E860 Dehydration: Secondary | ICD-10-CM | POA: Insufficient documentation

## 2014-11-17 DIAGNOSIS — K900 Celiac disease: Secondary | ICD-10-CM

## 2014-11-17 DIAGNOSIS — Z803 Family history of malignant neoplasm of breast: Secondary | ICD-10-CM

## 2014-11-17 DIAGNOSIS — Z853 Personal history of malignant neoplasm of breast: Secondary | ICD-10-CM | POA: Diagnosis not present

## 2014-11-17 DIAGNOSIS — C50311 Malignant neoplasm of lower-inner quadrant of right female breast: Secondary | ICD-10-CM

## 2014-11-17 LAB — COMPREHENSIVE METABOLIC PANEL (CC13)
ALT: 10 U/L (ref 0–55)
AST: 11 U/L (ref 5–34)
Albumin: 3.7 g/dL (ref 3.5–5.0)
Alkaline Phosphatase: 79 U/L (ref 40–150)
Anion Gap: 7 meq/L (ref 3–11)
BUN: 13 mg/dL (ref 7.0–26.0)
CO2: 26 meq/L (ref 22–29)
Calcium: 9.4 mg/dL (ref 8.4–10.4)
Chloride: 104 meq/L (ref 98–109)
Creatinine: 0.7 mg/dL (ref 0.6–1.1)
Glucose: 86 mg/dL (ref 70–140)
Potassium: 4.4 meq/L (ref 3.5–5.1)
Sodium: 137 meq/L (ref 136–145)
Total Bilirubin: 0.93 mg/dL (ref 0.20–1.20)
Total Protein: 6.5 g/dL (ref 6.4–8.3)

## 2014-11-17 LAB — CBC WITH DIFFERENTIAL/PLATELET
BASO%: 1 % (ref 0.0–2.0)
Basophils Absolute: 0 10e3/uL (ref 0.0–0.1)
EOS%: 20.2 % — ABNORMAL HIGH (ref 0.0–7.0)
Eosinophils Absolute: 0.4 10e3/uL (ref 0.0–0.5)
HCT: 40.1 % (ref 34.8–46.6)
HGB: 13.3 g/dL (ref 11.6–15.9)
LYMPH%: 22.5 % (ref 14.0–49.7)
MCH: 31.1 pg (ref 25.1–34.0)
MCHC: 33.2 g/dL (ref 31.5–36.0)
MCV: 93.5 fL (ref 79.5–101.0)
MONO#: 0.1 10e3/uL (ref 0.1–0.9)
MONO%: 2.9 % (ref 0.0–14.0)
NEUT#: 1 10e3/uL — ABNORMAL LOW (ref 1.5–6.5)
NEUT%: 53.4 % (ref 38.4–76.8)
Platelets: 131 10e3/uL — ABNORMAL LOW (ref 145–400)
RBC: 4.29 10e6/uL (ref 3.70–5.45)
RDW: 12.8 % (ref 11.2–14.5)
WBC: 1.8 10e3/uL — ABNORMAL LOW (ref 3.9–10.3)
lymph#: 0.4 10e3/uL — ABNORMAL LOW (ref 0.9–3.3)

## 2014-11-17 MED ORDER — HEPARIN SOD (PORK) LOCK FLUSH 100 UNIT/ML IV SOLN
500.0000 [IU] | Freq: Once | INTRAVENOUS | Status: AC
Start: 2014-11-17 — End: 2014-11-17
  Administered 2014-11-17: 500 [IU] via INTRAVENOUS
  Filled 2014-11-17: qty 5

## 2014-11-17 MED ORDER — SODIUM CHLORIDE 0.9 % IJ SOLN
10.0000 mL | Freq: Once | INTRAMUSCULAR | Status: AC
  Administered 2014-11-17: 10 mL via INTRAVENOUS

## 2014-11-17 MED ORDER — SODIUM CHLORIDE 0.9 % IV SOLN
INTRAVENOUS | Status: AC
  Administered 2014-11-17: 10:00:00 via INTRAVENOUS

## 2014-11-17 NOTE — Telephone Encounter (Signed)
perpof to sch appts-cld pt & gave time & dates of IV appts-pt understood-adv to get updated copy on 11/27-pt understood

## 2014-11-17 NOTE — Telephone Encounter (Signed)
Per staff message and POF I have scheduled appts. Advised scheduler of appts. JMW  

## 2014-11-17 NOTE — Telephone Encounter (Signed)
per pof to sch pt appt-sent MW email to sch pt IV-will contact pt once reply

## 2014-11-17 NOTE — Procedures (Signed)
[facility]  Procedure Note  Misty [name] YJG:[id number] DOB: 1976-10-04 DOA: 11/17/2014   PCP: Dr. 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

## 2014-11-17 NOTE — Progress Notes (Signed)
Misty [name]  [phone]) [phone] Fax:([phone]) [phone]     ID: Misty [name] DOB: 1976/07/17  MR#: [id number]  TWS#:[id number]  Patient Care Team: Glenda Chroman, MD as PCP - General (Internal Medicine) Fanny Skates, MD as Consulting Physician (General Surgery) Chauncey Cruel, MD as Consulting Physician (Oncology) PCP: Glenda Chroman., MD Santo Held, MD (GYN) Crissie Reese M.D. (Plastics)  CHIEF COMPLAINT: Estrogen receptor positive breast cancer  CURRENT TREATMENT: adjuvant chemotherapy    BREAST CANCER HISTORY: From the original intake note:  Misty [name] was closely followed for some right breast changes between 20 11/12/2012. Everything seemed stable at that time. In 2014 she was promoted in her job, was working very hard, and actually missed having a mammogram. Late in January 2015 she noted some pain in her right breast and by February when she lifted her arms she noted that the nipple was tilting sideways and seemed a little bit of gathered. She didn't think much of this however. It was not until July that she noted a palpable mass in the lateral right breast. She brought this to her gynecologist's attention and she was set up for diagnostic mammography at [facility] 08/04/2014.   There was an area of distortion in the lateral aspect of the right breast noted on mammography,  corresponding to a palpable firm mass at the 8:00 position of the right breast. Ultrasound confirmed a 2.4 cm irregular hypoechoic spiculated mass in the right breast at the 8:00 position and in addition at the 5:00 position 1 cm from the nipple there was a taller than wide irregular hypoechoic mass measuring 0.8 cm. There was no right axillary adenopathy. In the left breast upper-outer quadrant there was a minimally complicated cyst measuring 0.7 cm.  On 08/11/2014 the patient underwent biopsy of the 2 right breast masses noted on ultrasound. Both showed invasive ductal carcinoma, grade 1,  estrogen receptor 90% positive, progesterone receptor 90% positive, both with strong staining intensity, HER-2 equivocal at 2+ but negative by FISH, with the signals ratio being 1.20 and the number per nucleus 2.1. The proliferation fraction by MIB-1 was 17% ([id number]; this report was reviewed by our pathologist, who concurred, [name] [phone]).  On 08/25/2014 the patient underwent bilateral breast MRI at [place]. This showed, in the right breast, an irregular enhancing mass at the 8:00 position measuring 2.7 cm. Also in the right breast at the 6:00 position there was another enhancing mass measuring 0.6 cm, located 2 cm away from the larger mass. There were also masses in the retroareolar position measuring 0.7 cm, in the posterior central right breast measuring 0.5 cm, and another one just inferior to the midline measuring 5.5 cm. There were no masses or findings of concern in the left breast and no abnormal appearing lymph nodes.  The patient's subsequent history is as detailed below  INTERVAL HISTORY: Misty [name] returns today for follow-up of her breast cancer. Today is day 7 cycle 1 of adjuvant doxorubicin and cyclophosphamide given every 2 weeks with Neulasta on day 2. Incidentally she found a letter dictated on another patient in her chart. She graciously did not read it but alerted [place] to it. That has since been voided.  REVIEW OF SYSTEMS: Misty [name] went right back to work after her treatment on day 1. She did not feel good and ended up leaving early. She did not work and a 2. She had significant nausea, but no vomiting. She was not able to keep herself well-hydrated. Furthermore for the last  2 days (day 6 and day 7) she's had significant diarrhea. She said about 6 bowel movements a ready this morning. She feels "drain". She skipped a low-grade headache. She has some ringing and popping in her right ear. She is sleeping poorly. Her appetite is poor. She has been a little woozy headed from all the  nausea medications. She has not yet begun to lose her hair. A detailed review of systems today was otherwise noncontributory   PAST MEDICAL HISTORY: Past Medical History  Diagnosis Date  . Thyroid disease   . Celiac disease   . PONV (postoperative nausea and vomiting)   . Hypothyroidism   . Anxiety   . Headache(784.0)   . Cancer     breast cancer    PAST SURGICAL HISTORY: Past Surgical History  Procedure Laterality Date  . Cesarean section    . Gallbladder surgery    . Cholecystectomy    . Simple mastectomy with axillary sentinel node biopsy Right 09/28/2014    Procedure: RIGHT TOTAL MASTECTOMY, RIGHT  AXILLARY SENTINEL NODE BIOPSY;  Surgeon: Fanny Skates, MD;  Location: [facility];  Service: General;  Laterality: Right;  . Portacath placement Right 10/22/2014    Procedure: INSERTION PORT-A-CATH;  Surgeon: Fanny Skates, MD;  Location: [facility];  Service: General;  Laterality: Right;    FAMILY HISTORY Family History  Problem Relation Age of Onset  . Skin cancer Father   . Breast cancer Maternal Grandmother     dx unknown age  . Breast cancer Other 6    mat great aunt through [facility] with breast cancer   the patient's parents are both living, in their late 8s. The patient had one brother, no sisters. The patient's maternal grandmother was diagnosed with breast cancer in her late 59s. One of that grandmother's sisters was also diagnosed with breast cancer, in her 59s. There is no other history of breast or ovarian cancer in the family  GYNECOLOGIC HISTORY:  Patient's last menstrual period was 09/30/2014. Menarche age 84, first live birth age 33, the patient is GX P1. She is still having regular periods. 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 Her husband, Misty [name], works as a Engineer, structural  in [place]. Their son Misty [name] is 15. The patient attends a local [employer]: Not in place   HEALTH MAINTENANCE: History  Substance Use Topics  . Smoking status: Never Smoker   . Smokeless tobacco: Not on file  . Alcohol Use: No     Colonoscopy:  PAP: 07/29/2014  Bone density:  Lipid panel:  Allergies  Allergen Reactions  . Gluten Meal   . Penicillins Nausea And Vomiting    Current Outpatient Prescriptions  Medication Sig Dispense Refill  . dexamethasone (DECADRON) 4 MG tablet Take 2 tablets by mouth once a day on the day after chemotherapy and then take 2 tablets two times a day for 2 days. Take with food. 30 tablet 1  . HYDROcodone-acetaminophen (NORCO) 5-325 MG per tablet Take 1-2 tablets by mouth every 6 (six) hours as needed for moderate pain or severe pain. 30 tablet 0  . lidocaine-prilocaine (EMLA) cream Apply 1 application topically as needed. Apply over port area 1-2 hours before chemotherapy, cover with plastic wrap 30 g 0  . lidocaine-prilocaine (EMLA) cream Apply 1 application topically as needed. Apply  over port area 1-2 hours before chemotherapy, cover with plastic wrap 30 g 0  . LORazepam (ATIVAN) 0.5 MG tablet Take at bedtime as directed 30 tablet 0  . ondansetron (ZOFRAN) 8 MG tablet Take 1 tablet (8 mg total) by mouth 2 (two) times daily. 30 tablet 1  . prochlorperazine (COMPAZINE) 10 MG tablet Take every 6 hours by mouth as directed 30 tablet 1  . SYNTHROID 100 MCG tablet      No current facility-administered medications for this visit.    OBJECTIVE: Middle-aged white woman who appears stated age 38 Vitals:   11/17/14 0856  BP: 117/68  Pulse: 81  Temp: 98.1 F (36.7 C)  Resp: 20     Body mass index is 21.87 kg/(m^2).    ECOG FS:1 - Symptomatic but completely ambulatory  Sclerae unicteric, pupils equal and reactive Oropharynx clear and slightly dry No cervical or supraclavicular adenopathy Lungs no rales or rhonchi Heart  regular rate and rhythm Abd soft, nontender, positive bowel sounds MSK no focal spinal tenderness, no upper extremity lymphedema Neuro: nonfocal, well oriented, appropriate affect Breasts: The right breast is status post mastectomy; there is no evidence of chest wall recurrence. The right axilla is benign. The left breast is unremarkable.    LAB RESULTS:  CMP     Component Value Date/Time   NA 140 11/08/2014 1430   NA 142 09/23/2014 1456   K 3.7 11/08/2014 1430   K 3.9 09/23/2014 1456   CL 104 09/23/2014 1456   CO2 27 11/08/2014 1430   CO2 27 09/23/2014 1456   GLUCOSE 90 11/08/2014 1430   GLUCOSE 71 09/23/2014 1456   BUN 9.6 11/08/2014 1430   BUN 11 09/23/2014 1456   CREATININE 0.8 11/08/2014 1430   CREATININE 0.70 09/23/2014 1456   CALCIUM 9.6 11/08/2014 1430   CALCIUM 9.8 09/23/2014 1456   PROT 6.7 11/08/2014 1430   PROT 7.7 09/23/2014 1456   ALBUMIN 3.9 11/08/2014 1430   ALBUMIN 4.0 09/23/2014 1456   AST 18 11/08/2014 1430   AST 19 09/23/2014 1456   ALT 15 11/08/2014 1430   ALT 12 09/23/2014 1456   ALKPHOS 67 11/08/2014 1430   ALKPHOS 60 09/23/2014 1456   BILITOT 0.54 11/08/2014 1430   BILITOT 0.5 09/23/2014 1456   GFRNONAA >90 09/23/2014 1456   GFRAA >90 09/23/2014 1456    I No results found for: SPEP  Lab Results  Component Value Date   WBC 1.8* 11/17/2014   NEUTROABS 1.0* 11/17/2014   HGB 13.3 11/17/2014   HCT 40.1 11/17/2014   MCV 93.5 11/17/2014   PLT 131* 11/17/2014      Chemistry      Component Value Date/Time   NA 140 11/08/2014 1430   NA 142 09/23/2014 1456   K 3.7 11/08/2014 1430   K 3.9 09/23/2014 1456   CL 104 09/23/2014 1456   CO2 27 11/08/2014 1430   CO2 27 09/23/2014 1456   BUN 9.6 11/08/2014 1430   BUN 11 09/23/2014 1456   CREATININE 0.8 11/08/2014 1430   CREATININE 0.70 09/23/2014 1456      Component Value Date/Time   CALCIUM 9.6 11/08/2014 1430   CALCIUM 9.8 09/23/2014 1456   ALKPHOS 67 11/08/2014 1430   ALKPHOS 60  09/23/2014 1456   AST 18 11/08/2014 1430   AST 19 09/23/2014 1456   ALT 15 11/08/2014 1430   ALT 12 09/23/2014 1456   BILITOT 0.54 11/08/2014 1430   BILITOT 0.5 09/23/2014 1456  No results found for: LABCA2  No components found for: [id number]  No results for input(s): INR in the last 168 hours.  Urinalysis No results found for: COLORURINE  STUDIES: Most recent echocardiogram on 10/27/14 showed an ejection fraction of 50-55%  Dg Chest Port 1 View  10/22/2014   CLINICAL DATA:  Post Port-A-Cath placement age.  EXAM: PORTABLE CHEST - 1 VIEW  COMPARISON:  None.  FINDINGS: Right subclavian Port-A-Cath has been placed. The tip is near the cavoatrial junction. No pneumothorax.  Surgical clips in the right axilla. Probable right base atelectasis. Heart is normal size. Left lung is clear. No effusions.  IMPRESSION: Right subclavian Port-A-Cath tip near the cavoatrial junction. No pneumothorax.   Electronically Signed   By: Rolm Baptise M.D.   On: 10/22/2014 12:34   Dg Fluoro Guide Cv Line-no Report  10/22/2014   CLINICAL DATA:    FLOURO GUIDE CV LINE  Fluoroscopy was utilized by the requesting physician.  No radiographic  interpretation.     ASSESSMENT: 38 y.o. [place], [place] woman status post biopsy of 2 separate right breast masses 08/11/2014, both positive for an invasive ductal carcinoma, grade 1, estrogen and progesterone receptor strongly positive, with an MIB-1 of 19%, and HER-2 nonamplified  (1) genetics testing sent 08/26/2014, showing no mutations in the BRCA genes; there were also no mutations in a TM, BARD 1, BRIP1, CDH 1, CHE K2, MR E11A, M UT YH, N BN, NF1, PA L B2, PTE N, RAD 50, RAD 50 1C, RAD 50 1D, and T p53.  (2) status post right mastectomy with sentinel lymph node sampling 09/28/2014 for an mpT2 pN1, stage IIB invasive breast cancer (with both ductal and lobular features), grade 2, with negative margins  (3) adjuvant chemotherapy to consist of doxorubicin and  cyclophosphamide and dose dense fashion 4 with Neulasta support, beginning 11/11/2014, to be followed by paclitaxel  (4) radiation to follow chemotherapy.  (5) anti-estrogens to follow radiation  (6) history of celiac disease  PLAN: Misty [name] had a rough time with the first cycle of Cytoxan and Adriamycin. I don't think the diarrhea she had is going to be related. She also had some pain in the right ear. Possibly she had a concurrent virus. At any rate she really was not able to hydrate herself appropriately. We are going to give her fluids today and 2 days from now (we are close tomorrow). I am setting her up for additional fluids with each treatment to be on day 1, day 2, and day 3. I am hopeful that this will take care of the problem.  The day 3 therapy falls on December 31 which means we can't give her fluids on the first. I would have very little problem switching her day 3 treatment to the first Thursday in January, which gives her a week to recover from the first 2 treatments and allows [place] to hydrate her appropriately. We will make that decision when she sees [place] after cycle 2. The only other change making is liberalizing the lorazepam use that she can take it up to 4 times a day as needed for nausea. She understands this will make her a little bit on the woozy side and she should not drive.  If cycle 2 is no better than cycle 1 or is considerably worse we may have to reconsider our plans overall. Misty [name] has a good understanding of all that is involved. She agrees with this. She will call with any problems that may develop before  her next visit here.   Chauncey Cruel, MD   11/17/2014 9:13 AM

## 2014-11-19 ENCOUNTER — Ambulatory Visit (HOSPITAL_BASED_OUTPATIENT_CLINIC_OR_DEPARTMENT_OTHER): Payer: BC Managed Care – PPO

## 2014-11-19 DIAGNOSIS — R110 Nausea: Secondary | ICD-10-CM

## 2014-11-19 DIAGNOSIS — C50511 Malignant neoplasm of lower-outer quadrant of right female breast: Secondary | ICD-10-CM

## 2014-11-19 DIAGNOSIS — R197 Diarrhea, unspecified: Secondary | ICD-10-CM

## 2014-11-19 MED ORDER — HEPARIN SOD (PORK) LOCK FLUSH 100 UNIT/ML IV SOLN
500.0000 [IU] | Freq: Once | INTRAVENOUS | Status: AC
  Administered 2014-11-19: 500 [IU] via INTRAVENOUS
  Filled 2014-11-19: qty 5

## 2014-11-19 MED ORDER — SODIUM CHLORIDE 0.9 % IJ SOLN
10.0000 mL | INTRAMUSCULAR | Status: DC | PRN
  Administered 2014-11-19: 10 mL via INTRAVENOUS
  Filled 2014-11-19: qty 10

## 2014-11-19 MED ORDER — SODIUM CHLORIDE 0.9 % IV SOLN
Freq: Once | INTRAVENOUS | Status: AC
  Administered 2014-11-19: 15:00:00 via INTRAVENOUS

## 2014-11-19 NOTE — Patient Instructions (Signed)
Dehydration, Adult Dehydration is when you lose more fluids from the body than you take in. Vital organs like the kidneys, brain, and heart cannot function without a proper amount of fluids and salt. Any loss of fluids from the body can cause dehydration.  CAUSES   Vomiting.  Diarrhea.  Excessive sweating.  Excessive urine output.  Fever. SYMPTOMS  Mild dehydration  Thirst.  Dry lips.  Slightly dry mouth. Moderate dehydration  Very dry mouth.  Sunken eyes.  Skin does not bounce back quickly when lightly pinched and released.  Dark urine and decreased urine production.  Decreased tear production.  Headache. Severe dehydration  Very dry mouth.  Extreme thirst.  Rapid, weak pulse (more than 100 beats per minute at rest).  Cold hands and feet.  Not able to sweat in spite of heat and temperature.  Rapid breathing.  Blue lips.  Confusion and lethargy.  Difficulty being awakened.  Minimal urine production.  No tears. DIAGNOSIS  Your caregiver will diagnose dehydration based on your symptoms and your exam. Blood and urine tests will help confirm the diagnosis. The diagnostic evaluation should also identify the cause of dehydration. TREATMENT  Treatment of mild or moderate dehydration can often be done at home by increasing the amount of fluids that you drink. It is best to drink small amounts of fluid more often. Drinking too much at one time can make vomiting worse. Refer to the home care instructions below. Severe dehydration needs to be treated at the hospital where you will probably be given intravenous (IV) fluids that contain water and electrolytes. HOME CARE INSTRUCTIONS   Ask your caregiver about specific rehydration instructions.  Drink enough fluids to keep your urine clear or pale yellow.  Drink small amounts frequently if you have nausea and vomiting.  Eat as you normally do.  Avoid:  Foods or drinks high in sugar.  Carbonated  drinks.  Juice.  Extremely hot or cold fluids.  Drinks with caffeine.  Fatty, greasy foods.  Alcohol.  Tobacco.  Overeating.  Gelatin desserts.  Wash your hands well to avoid spreading bacteria and viruses.  Only take over-the-counter or prescription medicines for pain, discomfort, or fever as directed by your caregiver.  Ask your caregiver if you should continue all prescribed and over-the-counter medicines.  Keep all follow-up appointments with your caregiver. SEEK MEDICAL CARE IF:  You have abdominal pain and it increases or stays in one area (localizes).  You have a rash, stiff neck, or severe headache.  You are irritable, sleepy, or difficult to awaken.  You are weak, dizzy, or extremely thirsty. SEEK IMMEDIATE MEDICAL CARE IF:   You are unable to keep fluids down or you get worse despite treatment.  You have frequent episodes of vomiting or diarrhea.  You have blood or green matter (bile) in your vomit.  You have blood in your stool or your stool looks black and tarry.  You have not urinated in 6 to 8 hours, or you have only urinated a small amount of very dark urine.  You have a fever.  You faint. MAKE SURE YOU:   Understand these instructions.  Will watch your condition.  Will get help right away if you are not doing well or get worse. Document Released: 12/10/2005 Document Revised: 03/03/2012 Document Reviewed: 07/30/2011 Exit[employer] Patient Information 2015 [employer]. This information is not intended to replace advice given to you by your health care provider. Make sure you discuss any questions you have with your health care   provider.  

## 2014-11-24 ENCOUNTER — Other Ambulatory Visit: Payer: Self-pay | Admitting: *Deleted

## 2014-11-25 ENCOUNTER — Encounter: Payer: Self-pay | Admitting: Nurse Practitioner

## 2014-11-25 ENCOUNTER — Other Ambulatory Visit (HOSPITAL_BASED_OUTPATIENT_CLINIC_OR_DEPARTMENT_OTHER): Payer: BC Managed Care – PPO

## 2014-11-25 ENCOUNTER — Ambulatory Visit (HOSPITAL_BASED_OUTPATIENT_CLINIC_OR_DEPARTMENT_OTHER): Payer: BC Managed Care – PPO

## 2014-11-25 ENCOUNTER — Ambulatory Visit (HOSPITAL_BASED_OUTPATIENT_CLINIC_OR_DEPARTMENT_OTHER): Payer: BC Managed Care – PPO | Admitting: Nurse Practitioner

## 2014-11-25 VITALS — BP 105/53 | HR 88 | Temp 97.8°F | Resp 18 | Ht 61.0 in | Wt 120.9 lb

## 2014-11-25 DIAGNOSIS — C50311 Malignant neoplasm of lower-inner quadrant of right female breast: Secondary | ICD-10-CM

## 2014-11-25 DIAGNOSIS — Z5111 Encounter for antineoplastic chemotherapy: Secondary | ICD-10-CM

## 2014-11-25 DIAGNOSIS — K900 Celiac disease: Secondary | ICD-10-CM

## 2014-11-25 DIAGNOSIS — E860 Dehydration: Secondary | ICD-10-CM

## 2014-11-25 DIAGNOSIS — C50511 Malignant neoplasm of lower-outer quadrant of right female breast: Secondary | ICD-10-CM

## 2014-11-25 DIAGNOSIS — Z170 Estrogen receptor positive status [ER+]: Secondary | ICD-10-CM

## 2014-11-25 DIAGNOSIS — Z803 Family history of malignant neoplasm of breast: Secondary | ICD-10-CM

## 2014-11-25 LAB — COMPREHENSIVE METABOLIC PANEL (CC13)
ALBUMIN: 3.6 g/dL (ref 3.5–5.0)
ALK PHOS: 80 U/L (ref 40–150)
ALT: 14 U/L (ref 0–55)
AST: 17 U/L (ref 5–34)
Anion Gap: 7 mEq/L (ref 3–11)
BUN: 10 mg/dL (ref 7.0–26.0)
CO2: 27 mEq/L (ref 22–29)
Calcium: 9.1 mg/dL (ref 8.4–10.4)
Chloride: 108 mEq/L (ref 98–109)
Creatinine: 0.7 mg/dL (ref 0.6–1.1)
EGFR: >90 mL/min/{1.73_m2} (ref >90)
GLUCOSE: 91 mg/dL (ref 70–140)
Potassium: 4.4 mEq/L (ref 3.5–5.1)
Sodium: 142 mEq/L (ref 136–145)
Total Protein: 6.4 g/dL (ref 6.4–8.3)

## 2014-11-25 LAB — CBC WITH DIFFERENTIAL/PLATELET
BASO%: 0.8 % (ref 0.0–2.0)
BASOS ABS: 0.1 10*3/uL (ref 0.0–0.1)
EOS%: 0.5 % (ref 0.0–7.0)
Eosinophils Absolute: 0 10*3/uL (ref 0.0–0.5)
HEMATOCRIT: 36.8 % (ref 34.8–46.6)
HGB: 12.3 g/dL (ref 11.6–15.9)
LYMPH%: 11.3 % — ABNORMAL LOW (ref 14.0–49.7)
MCH: 31 pg (ref 25.1–34.0)
MCHC: 33.4 g/dL (ref 31.5–36.0)
MCV: 92.9 fL (ref 79.5–101.0)
MONO#: 0.5 10*3/uL (ref 0.1–0.9)
MONO%: 5.8 % (ref 0.0–14.0)
NEUT#: 7 10*3/uL — ABNORMAL HIGH (ref 1.5–6.5)
NEUT%: 81.6 % — ABNORMAL HIGH (ref 38.4–76.8)
Platelets: 212 10*3/uL (ref 145–400)
RBC: 3.96 10*6/uL (ref 3.70–5.45)
RDW: 12.4 % (ref 11.2–14.5)
WBC: 8.6 10*3/uL (ref 3.9–10.3)
lymph#: 1 10*3/uL (ref 0.9–3.3)

## 2014-11-25 MED ORDER — DEXAMETHASONE SODIUM PHOSPHATE 20 MG/5ML IJ SOLN
12.0000 mg | Freq: Once | INTRAMUSCULAR | Status: AC
  Administered 2014-11-25: 12 mg via INTRAVENOUS

## 2014-11-25 MED ORDER — PALONOSETRON HCL INJECTION 0.25 MG/5ML
0.2500 mg | Freq: Once | INTRAVENOUS | Status: AC
  Administered 2014-11-25: 0.25 mg via INTRAVENOUS

## 2014-11-25 MED ORDER — DOXORUBICIN HCL CHEMO IV INJECTION 2 MG/ML
60.0000 mg/m2 | Freq: Once | INTRAVENOUS | Status: AC
  Administered 2014-11-25: 92 mg via INTRAVENOUS
  Filled 2014-11-25: qty 46

## 2014-11-25 MED ORDER — SODIUM CHLORIDE 0.9 % IV SOLN
600.0000 mg/m2 | Freq: Once | INTRAVENOUS | Status: AC
  Administered 2014-11-25: 920 mg via INTRAVENOUS
  Filled 2014-11-25: qty 46

## 2014-11-25 MED ORDER — SODIUM CHLORIDE 0.9 % IV SOLN
Freq: Once | INTRAVENOUS | Status: AC
  Administered 2014-11-25: 12:00:00 via INTRAVENOUS

## 2014-11-25 MED ORDER — PALONOSETRON HCL INJECTION 0.25 MG/5ML
INTRAVENOUS | Status: AC
  Filled 2014-11-25: qty 5

## 2014-11-25 MED ORDER — SODIUM CHLORIDE 0.9 % IJ SOLN
10.0000 mL | INTRAMUSCULAR | Status: DC | PRN
  Administered 2014-11-25: 10 mL
  Filled 2014-11-25: qty 10

## 2014-11-25 MED ORDER — SODIUM CHLORIDE 0.9 % IV SOLN
150.0000 mg | Freq: Once | INTRAVENOUS | Status: AC
  Administered 2014-11-25: 150 mg via INTRAVENOUS
  Filled 2014-11-25: qty 5

## 2014-11-25 MED ORDER — HEPARIN SOD (PORK) LOCK FLUSH 100 UNIT/ML IV SOLN
500.0000 [IU] | Freq: Once | INTRAVENOUS | Status: AC | PRN
  Administered 2014-11-25: 500 [IU]
  Filled 2014-11-25: qty 5

## 2014-11-25 MED ORDER — DEXAMETHASONE SODIUM PHOSPHATE 20 MG/5ML IJ SOLN
INTRAMUSCULAR | Status: AC
Start: 2014-11-25 — End: 2014-11-25
  Filled 2014-11-25: qty 5

## 2014-11-25 NOTE — Progress Notes (Signed)
[place]  [phone] [phone]) [phone]     ID: Mckynlie Vanderslice DOB: [birth year]  MR#: [id number]  ZTI#:[id number]  Patient Care Team: Glenda Chroman, MD as PCP - General (Internal Medicine) Fanny Skates, MD as Consulting Physician (General Surgery) Chauncey Cruel, MD as Consulting Physician (Oncology) PCP: Glenda Chroman., MD Santo Held, MD (GYN) Crissie Reese M.D. (Plastics)  CHIEF COMPLAINT: Estrogen receptor positive breast cancer  CURRENT TREATMENT: adjuvant chemotherapy    BREAST CANCER HISTORY: From the original intake note:  Amayrany was closely followed for some right breast changes between 20 11/12/2012. Everything seemed stable at that time. In 2014 she was promoted in her job, was working very hard, and actually missed having a mammogram. Late in January 2015 she noted some pain in her right breast and by February when she lifted her arms she noted that the nipple was tilting sideways and seemed a little bit of gathered. She didn't think much of this however. It was not until July that she noted a palpable mass in the lateral right breast. She brought this to her gynecologist's attention and she was set up for diagnostic mammography at [facility] 08/04/2014.   There was an area of distortion in the lateral aspect of the right breast noted on mammography,  corresponding to a palpable firm mass at the 8:00 position of the right breast. Ultrasound confirmed a 2.4 cm irregular hypoechoic spiculated mass in the right breast at the 8:00 position and in addition at the 5:00 position 1 cm from the nipple there was a taller than wide irregular hypoechoic mass measuring 0.8 cm. There was no right axillary adenopathy. In the left breast upper-outer quadrant there was a minimally complicated cyst measuring 0.7 cm.  On 08/11/2014 the patient underwent biopsy of the 2 right breast masses noted on ultrasound. Both showed invasive ductal carcinoma, grade 1,  estrogen receptor 90% positive, progesterone receptor 90% positive, both with strong staining intensity, HER-2 equivocal at 2+ but negative by FISH, with the signals ratio being 1.20 and the number per nucleus 2.1. The proliferation fraction by MIB-1 was 17% ([id number]; this report was reviewed by our pathologist, who concurred, [name] [phone]).  On 08/25/2014 the patient underwent bilateral breast MRI at [place]. This showed, in the right breast, an irregular enhancing mass at the 8:00 position measuring 2.7 cm. Also in the right breast at the 6:00 position there was another enhancing mass measuring 0.6 cm, located 2 cm away from the larger mass. There were also masses in the retroareolar position measuring 0.7 cm, in the posterior central right breast measuring 0.5 cm, and another one just inferior to the midline measuring 5.5 cm. There were no masses or findings of concern in the left breast and no abnormal appearing lymph nodes.  The patient's subsequent history is as detailed below  INTERVAL HISTORY: Williette returns today for follow up of her breast cancer. Today is day 1, cycle 2 of adjuvant doxorubicin and cyclophosphamide given every 2 weeks with Neulasta on day 2. Koraline has improved since her last visit here. Her appetite has returned and she has gained 5lbs after eating more calorie-dense foods. She is better hydrated and her energy level has improved. Her diarrhea slowed on its own. This week she is concerned with her port. After fluids on Friday, the nurse had a difficult time removing the plastic covering. Now Earnestine feels "a small piece of plastic" sticking out of the medial side of her port incision. The  port itself is mildly tender, but there is no redness or swelling.   REVIEW OF SYSTEMS: A detailed review of systems is otherwise negative except where noted above.    PAST MEDICAL HISTORY: Past Medical History  Diagnosis Date  . Thyroid disease   . Celiac disease   . PONV  (postoperative nausea and vomiting)   . Hypothyroidism   . Anxiety   . Headache(784.0)   . Cancer     breast cancer    PAST SURGICAL HISTORY: Past Surgical History  Procedure Laterality Date  . Cesarean section    . Gallbladder surgery    . Cholecystectomy    . Simple mastectomy with axillary sentinel node biopsy Right 09/28/2014    Procedure: RIGHT TOTAL MASTECTOMY, RIGHT  AXILLARY SENTINEL NODE BIOPSY;  Surgeon: Fanny Skates, MD;  Location: [facility] [place];  Service: General;  Laterality: Right;  . Portacath placement Right 10/22/2014    Procedure: INSERTION PORT-A-CATH;  Surgeon: Fanny Skates, MD;  Location: [facility];  Service: General;  Laterality: Right;    FAMILY HISTORY Family History  Problem Relation Age of Onset  . Skin cancer Father   . Breast cancer Maternal Grandmother     dx unknown age  . Breast cancer Other 38    mat great aunt through [facility] with breast cancer   the patient's parents are both living, in their late 59s. The patient had one brother, no sisters. The patient's maternal grandmother was diagnosed with breast cancer in her late 45s. One of that grandmother's sisters was also diagnosed with breast cancer, in her 82s. There is no other history of breast or ovarian cancer in the family  GYNECOLOGIC HISTORY:  No LMP recorded. Menarche age 38, first live birth age 38, the patient is GX P1. She is still having regular periods. 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 Her husband, [name], works as a Engineer, structural in [place]. Their son [name] is 28. The patient attends a local [employer]: Not in place   HEALTH MAINTENANCE: History  Substance Use Topics  . Smoking status: Never Smoker   . Smokeless tobacco: Not on file  . Alcohol Use: No      Colonoscopy:  PAP: 07/29/2014  Bone density:  Lipid panel:  Allergies  Allergen Reactions  . Gluten Meal   . Penicillins Nausea And Vomiting    Current Outpatient Prescriptions  Medication Sig Dispense Refill  . dexamethasone (DECADRON) 4 MG tablet Take 2 tablets by mouth once a day on the day after chemotherapy and then take 2 tablets two times a day for 2 days. Take with food. 30 tablet 1  . HYDROcodone-acetaminophen (NORCO) 5-325 MG per tablet Take 1-2 tablets by mouth every 6 (six) hours as needed for moderate pain or severe pain. 30 tablet 0  . lidocaine-prilocaine (EMLA) cream Apply 1 application topically as needed. Apply over port area 1-2 hours before chemotherapy, cover with plastic wrap 30 g 0  . lidocaine-prilocaine (EMLA) cream Apply 1 application topically as needed. Apply over port area 1-2 hours before chemotherapy, cover with plastic wrap 30 g 0  . LORazepam (ATIVAN) 0.5 MG tablet Take at bedtime as directed 30 tablet 0  . ondansetron (ZOFRAN) 8 MG tablet Take 1 tablet (8 mg total) by mouth 2 (two) times daily. 30 tablet  1  . prochlorperazine (COMPAZINE) 10 MG tablet Take every 6 hours by mouth as directed 30 tablet 1  . SYNTHROID 100 MCG tablet      No current facility-administered medications for this visit.   Facility-Administered Medications Ordered in Other Visits  Medication Dose Route Frequency Provider Last Rate Last Dose  . cyclophosphamide (CYTOXAN) 920 mg in sodium chloride 0.9 % 250 mL chemo infusion  600 mg/m2 (Treatment Plan Actual) Intravenous Once Chauncey Cruel, MD      . Dexamethasone Sodium Phosphate (DECADRON) injection 12 mg  12 mg Intravenous Once Chauncey Cruel, MD      . DOXOrubicin (ADRIAMYCIN) chemo injection 92 mg  60 mg/m2 (Treatment Plan Actual) Intravenous Once Chauncey Cruel, MD      . fosaprepitant (EMEND) 150 mg in sodium chloride 0.9 % 145 mL IVPB  150 mg Intravenous Once Chauncey Cruel, MD      . heparin lock flush  100 unit/mL  500 Units Intracatheter Once PRN Chauncey Cruel, MD      . sodium chloride 0.9 % injection 10 mL  10 mL Intracatheter PRN Chauncey Cruel, MD        OBJECTIVE: Middle-aged white woman who appears stated age 38 Vitals:   11/25/14 1034  BP: 105/53  Pulse: 88  Temp: 97.8 F (36.6 C)  Resp: 18     Body mass index is 22.86 kg/(m^2).    ECOG FS:1 - Symptomatic but completely ambulatory   Skin: warm, dry  HEENT: sclerae anicteric, conjunctivae pink, oropharynx clear. No thrush or mucositis.  Lymph Nodes: No cervical or supraclavicular lymphadenopathy  Lungs: clear to auscultation bilaterally, no rales, wheezes, or rhonci  Heart: regular rate and rhythm  Abdomen: round, soft, non tender, positive bowel sounds  Musculoskeletal: No focal spinal tenderness, no peripheral edema  Neuro: non focal, well oriented, positive affect  Breasts: deferred. Right chest port side clean dry and intact. Small piece of either plastic or scab palpable to medial incision site. Possibly clear suture. No redness or swelling present   LAB RESULTS:  CMP     Component Value Date/Time   NA 142 11/25/2014 1022   NA 142 09/23/2014 1456   K 4.4 11/25/2014 1022   K 3.9 09/23/2014 1456   CL 104 09/23/2014 1456   CO2 27 11/25/2014 1022   CO2 27 09/23/2014 1456   GLUCOSE 91 11/25/2014 1022   GLUCOSE 71 09/23/2014 1456   BUN 10.0 11/25/2014 1022   BUN 11 09/23/2014 1456   CREATININE 0.7 11/25/2014 1022   CREATININE 0.70 09/23/2014 1456   CALCIUM 9.1 11/25/2014 1022   CALCIUM 9.8 09/23/2014 1456   PROT 6.4 11/25/2014 1022   PROT 7.7 09/23/2014 1456   ALBUMIN 3.6 11/25/2014 1022   ALBUMIN 4.0 09/23/2014 1456   AST 17 11/25/2014 1022   AST 19 09/23/2014 1456   ALT 14 11/25/2014 1022   ALT 12 09/23/2014 1456   ALKPHOS 80 11/25/2014 1022   ALKPHOS 60 09/23/2014 1456   BILITOT <0.20 11/25/2014 1022   BILITOT 0.5 09/23/2014 1456   GFRNONAA >90 09/23/2014 1456   GFRAA >90 09/23/2014 1456     I No results found for: SPEP  Lab Results  Component Value Date   WBC 8.6 11/25/2014   NEUTROABS 7.0* 11/25/2014   HGB 12.3 11/25/2014   HCT 36.8 11/25/2014   MCV 92.9 11/25/2014   PLT 212 11/25/2014      Chemistry      Component  Value Date/Time   NA 142 11/25/2014 1022   NA 142 09/23/2014 1456   K 4.4 11/25/2014 1022   K 3.9 09/23/2014 1456   CL 104 09/23/2014 1456   CO2 27 11/25/2014 1022   CO2 27 09/23/2014 1456   BUN 10.0 11/25/2014 1022   BUN 11 09/23/2014 1456   CREATININE 0.7 11/25/2014 1022   CREATININE 0.70 09/23/2014 1456      Component Value Date/Time   CALCIUM 9.1 11/25/2014 1022   CALCIUM 9.8 09/23/2014 1456   ALKPHOS 80 11/25/2014 1022   ALKPHOS 60 09/23/2014 1456   AST 17 11/25/2014 1022   AST 19 09/23/2014 1456   ALT 14 11/25/2014 1022   ALT 12 09/23/2014 1456   BILITOT <0.20 11/25/2014 1022   BILITOT 0.5 09/23/2014 1456       No results found for: LABCA2  No components found for: LABCA125  No results for input(s): INR in the last 168 hours.  Urinalysis No results found for: COLORURINE  STUDIES: Most recent echocardiogram on 10/27/14 showed an ejection fraction of 50-55%  No results found.  ASSESSMENT: 38 y.o. [place], [place] woman status post biopsy of 2 separate right breast masses 08/11/2014, both positive for an invasive ductal carcinoma, grade 1, estrogen and progesterone receptor strongly positive, with an MIB-1 of 19%, and HER-2 nonamplified  (1) genetics testing sent 08/26/2014, showing no mutations in the BRCA genes; there were also no mutations in a TM, BARD 1, BRIP1, CDH 1, CHE K2, MR E11A, M UT YH, N BN, NF1, PA L B2, PTE N, RAD 50, RAD 50 1C, RAD 50 1D, and T p53.  (2) status post right mastectomy with sentinel lymph node sampling 09/28/2014 for an mpT2 pN1, stage IIB invasive breast cancer (with both ductal and lobular features), grade 2, with negative margins  (3) adjuvant chemotherapy to consist of doxorubicin and  cyclophosphamide and dose dense fashion 4 with Neulasta support, beginning 11/11/2014, to be followed by paclitaxel  (4) radiation to follow chemotherapy.  (5) anti-estrogens to follow radiation  (6) history of celiac disease  PLAN: Starlett is doing well today. The labs were reviewed in detail and were completely stable. I had Dr. Jana Hakim examine the port himself and he states that it is ok to use for treatment today and we will continue to monitor it weekly. We will proceed with cycle 2 of doxorubicin and cyclophosphamide today. Jalan is scheduled for IV fluids tomorrow and Saturday in order to avoid issues with dehydration.   Youlanda will return next week for labs and her nadir visit. She understands and agrees with this plans. She knows the goal of treatment in her case is cure. She has been encouraged to call with any issues that might arise before her next visit here.    Marcelino Duster, NP   11/25/2014 12:20 PM

## 2014-11-25 NOTE — Patient Instructions (Signed)
[facility] Discharge Instructions for Patients Receiving Chemotherapy  Today you received the following chemotherapy agents: Adriamycin and Cytoxan.  To help prevent nausea and vomiting after your treatment, we encourage you to take your nausea medication:  Compazine 10 mg every 6 hours and Zofran 8 mg every 12 hours as needed.   If you develop nausea and vomiting that is not controlled by your nausea medication, call the clinic.   BELOW ARE SYMPTOMS THAT SHOULD BE REPORTED IMMEDIATELY:  *FEVER GREATER THAN 100.5 F  *CHILLS WITH OR WITHOUT FEVER  NAUSEA AND VOMITING THAT IS NOT CONTROLLED WITH YOUR NAUSEA MEDICATION  *UNUSUAL SHORTNESS OF BREATH  *UNUSUAL BRUISING OR BLEEDING  TENDERNESS IN MOUTH AND THROAT WITH OR WITHOUT PRESENCE OF ULCERS  *URINARY PROBLEMS  *BOWEL PROBLEMS  UNUSUAL RASH Items with * indicate a potential emergency and should be followed up as soon as possible.  Feel free to call the clinic you have any questions or concerns. The clinic phone number is [phone].    

## 2014-11-26 ENCOUNTER — Ambulatory Visit (HOSPITAL_BASED_OUTPATIENT_CLINIC_OR_DEPARTMENT_OTHER): Payer: BC Managed Care – PPO

## 2014-11-26 ENCOUNTER — Ambulatory Visit: Payer: BC Managed Care – PPO

## 2014-11-26 DIAGNOSIS — K900 Celiac disease: Secondary | ICD-10-CM

## 2014-11-26 DIAGNOSIS — C50511 Malignant neoplasm of lower-outer quadrant of right female breast: Secondary | ICD-10-CM

## 2014-11-26 DIAGNOSIS — Z803 Family history of malignant neoplasm of breast: Secondary | ICD-10-CM

## 2014-11-26 DIAGNOSIS — Z5189 Encounter for other specified aftercare: Secondary | ICD-10-CM

## 2014-11-26 DIAGNOSIS — E860 Dehydration: Secondary | ICD-10-CM

## 2014-11-26 MED ORDER — PEGFILGRASTIM INJECTION 6 MG/0.6ML ~~LOC~~
6.0000 mg | PREFILLED_SYRINGE | Freq: Once | SUBCUTANEOUS | Status: AC
  Administered 2014-11-26: 6 mg via SUBCUTANEOUS
  Filled 2014-11-26: qty 0.6

## 2014-11-26 MED ORDER — SODIUM CHLORIDE 0.9 % IV SOLN
Freq: Once | INTRAVENOUS | Status: AC
  Administered 2014-11-26: 10:00:00 via INTRAVENOUS

## 2014-11-26 NOTE — Patient Instructions (Signed)
Dehydration, Adult Dehydration is when you lose more fluids from the body than you take in. Vital organs like the kidneys, brain, and heart cannot function without a proper amount of fluids and salt. Any loss of fluids from the body can cause dehydration.  CAUSES   Vomiting.  Diarrhea.  Excessive sweating.  Excessive urine output.  Fever. SYMPTOMS  Mild dehydration  Thirst.  Dry lips.  Slightly dry mouth. Moderate dehydration  Very dry mouth.  Sunken eyes.  Skin does not bounce back quickly when lightly pinched and released.  Dark urine and decreased urine production.  Decreased tear production.  Headache. Severe dehydration  Very dry mouth.  Extreme thirst.  Rapid, weak pulse (more than 100 beats per minute at rest).  Cold hands and feet.  Not able to sweat in spite of heat and temperature.  Rapid breathing.  Blue lips.  Confusion and lethargy.  Difficulty being awakened.  Minimal urine production.  No tears. DIAGNOSIS  Your caregiver will diagnose dehydration based on your symptoms and your exam. Blood and urine tests will help confirm the diagnosis. The diagnostic evaluation should also identify the cause of dehydration. TREATMENT  Treatment of mild or moderate dehydration can often be done at home by increasing the amount of fluids that you drink. It is best to drink small amounts of fluid more often. Drinking too much at one time can make vomiting worse. Refer to the home care instructions below. Severe dehydration needs to be treated at the hospital where you will probably be given intravenous (IV) fluids that contain water and electrolytes. HOME CARE INSTRUCTIONS   Ask your caregiver about specific rehydration instructions.  Drink enough fluids to keep your urine clear or pale yellow.  Drink small amounts frequently if you have nausea and vomiting.  Eat as you normally do.  Avoid:  Foods or drinks high in sugar.  Carbonated  drinks.  Juice.  Extremely hot or cold fluids.  Drinks with caffeine.  Fatty, greasy foods.  Alcohol.  Tobacco.  Overeating.  Gelatin desserts.  Wash your hands well to avoid spreading bacteria and viruses.  Only take over-the-counter or prescription medicines for pain, discomfort, or fever as directed by your caregiver.  Ask your caregiver if you should continue all prescribed and over-the-counter medicines.  Keep all follow-up appointments with your caregiver. SEEK MEDICAL CARE IF:  You have abdominal pain and it increases or stays in one area (localizes).  You have a rash, stiff neck, or severe headache.  You are irritable, sleepy, or difficult to awaken.  You are weak, dizzy, or extremely thirsty. SEEK IMMEDIATE MEDICAL CARE IF:   You are unable to keep fluids down or you get worse despite treatment.  You have frequent episodes of vomiting or diarrhea.  You have blood or green matter (bile) in your vomit.  You have blood in your stool or your stool looks black and tarry.  You have not urinated in 6 to 8 hours, or you have only urinated a small amount of very dark urine.  You have a fever.  You faint. MAKE SURE YOU:   Understand these instructions.  Will watch your condition.  Will get help right away if you are not doing well or get worse. Document Released: 12/10/2005 Document Revised: 03/03/2012 Document Reviewed: 07/30/2011 Exit[employer] Patient Information 2015 [employer]. This information is not intended to replace advice given to you by your health care provider. Make sure you discuss any questions you have with your health care   provider.  

## 2014-11-27 ENCOUNTER — Ambulatory Visit (HOSPITAL_BASED_OUTPATIENT_CLINIC_OR_DEPARTMENT_OTHER): Payer: BC Managed Care – PPO

## 2014-11-27 DIAGNOSIS — E860 Dehydration: Secondary | ICD-10-CM

## 2014-11-27 DIAGNOSIS — C50511 Malignant neoplasm of lower-outer quadrant of right female breast: Secondary | ICD-10-CM

## 2014-11-27 MED ORDER — SODIUM CHLORIDE 0.9 % IV SOLN
Freq: Once | INTRAVENOUS | Status: AC
  Administered 2014-11-27: 09:00:00 via INTRAVENOUS

## 2014-12-01 ENCOUNTER — Telehealth: Payer: Self-pay | Admitting: Oncology

## 2014-12-01 ENCOUNTER — Ambulatory Visit (HOSPITAL_BASED_OUTPATIENT_CLINIC_OR_DEPARTMENT_OTHER): Payer: BC Managed Care – PPO | Admitting: Nurse Practitioner

## 2014-12-01 ENCOUNTER — Ambulatory Visit (HOSPITAL_BASED_OUTPATIENT_CLINIC_OR_DEPARTMENT_OTHER): Payer: BC Managed Care – PPO

## 2014-12-01 ENCOUNTER — Telehealth: Payer: Self-pay | Admitting: *Deleted

## 2014-12-01 ENCOUNTER — Other Ambulatory Visit: Payer: Self-pay | Admitting: *Deleted

## 2014-12-01 VITALS — BP 110/60 | HR 80 | Temp 98.2°F | Resp 18 | Ht 61.0 in | Wt 115.7 lb

## 2014-12-01 DIAGNOSIS — R110 Nausea: Secondary | ICD-10-CM

## 2014-12-01 DIAGNOSIS — K5900 Constipation, unspecified: Secondary | ICD-10-CM

## 2014-12-01 DIAGNOSIS — C50919 Malignant neoplasm of unspecified site of unspecified female breast: Secondary | ICD-10-CM

## 2014-12-01 DIAGNOSIS — E860 Dehydration: Secondary | ICD-10-CM

## 2014-12-01 DIAGNOSIS — R630 Anorexia: Secondary | ICD-10-CM

## 2014-12-01 DIAGNOSIS — C50511 Malignant neoplasm of lower-outer quadrant of right female breast: Secondary | ICD-10-CM

## 2014-12-01 DIAGNOSIS — R634 Abnormal weight loss: Secondary | ICD-10-CM

## 2014-12-01 DIAGNOSIS — R350 Frequency of micturition: Secondary | ICD-10-CM

## 2014-12-01 DIAGNOSIS — R530 Neoplastic (malignant) related fatigue: Secondary | ICD-10-CM

## 2014-12-01 DIAGNOSIS — R5383 Other fatigue: Secondary | ICD-10-CM

## 2014-12-01 DIAGNOSIS — I9589 Other hypotension: Secondary | ICD-10-CM

## 2014-12-01 LAB — COMPREHENSIVE METABOLIC PANEL (CC13)
ALBUMIN: 3.5 g/dL (ref 3.5–5.0)
ALT: 16 U/L (ref 0–55)
AST: 13 U/L (ref 5–34)
Alkaline Phosphatase: 107 U/L (ref 40–150)
Anion Gap: 9 mEq/L (ref 3–11)
BUN: 9.5 mg/dL (ref 7.0–26.0)
CALCIUM: 9 mg/dL (ref 8.4–10.4)
CHLORIDE: 101 meq/L (ref 98–109)
CO2: 28 meq/L (ref 22–29)
Creatinine: 0.7 mg/dL (ref 0.6–1.1)
EGFR: >90 mL/min/{1.73_m2} (ref >90)
Glucose: 88 mg/dl (ref 70–140)
Potassium: 4.9 mEq/L (ref 3.5–5.1)
Sodium: 138 mEq/L (ref 136–145)
Total Bilirubin: 0.71 mg/dL (ref 0.20–1.20)
Total Protein: 6.2 g/dL — ABNORMAL LOW (ref 6.4–8.3)

## 2014-12-01 LAB — CBC WITH DIFFERENTIAL/PLATELET
BASO%: 1 % (ref 0.0–2.0)
Basophils Absolute: 0 10*3/uL (ref 0.0–0.1)
EOS ABS: 0 10*3/uL (ref 0.0–0.5)
EOS%: 4.1 % (ref 0.0–7.0)
HCT: 36.2 % (ref 34.8–46.6)
HEMOGLOBIN: 12.4 g/dL (ref 11.6–15.9)
LYMPH%: 30.9 % (ref 14.0–49.7)
MCH: 31.2 pg (ref 25.1–34.0)
MCHC: 34.3 g/dL (ref 31.5–36.0)
MCV: 91.2 fL (ref 79.5–101.0)
MONO#: 0 10*3/uL — AB (ref 0.1–0.9)
MONO%: 3.1 % (ref 0.0–14.0)
NEUT%: 60.9 % (ref 38.4–76.8)
NEUTROS ABS: 0.6 10*3/uL — AB (ref 1.5–6.5)
NRBC: 0 % (ref 0–0)
PLATELETS: 105 10*3/uL — AB (ref 145–400)
RBC: 3.97 10*6/uL (ref 3.70–5.45)
RDW: 13.4 % (ref 11.2–14.5)
WBC: 1 10*3/uL — AB (ref 3.9–10.3)
lymph#: 0.3 10*3/uL — ABNORMAL LOW (ref 0.9–3.3)

## 2014-12-01 MED ORDER — ONDANSETRON 8 MG/NS 50 ML IVPB
INTRAVENOUS | Status: AC
  Filled 2014-12-01: qty 8

## 2014-12-01 MED ORDER — ONDANSETRON 8 MG/50ML IVPB (CHCC)
8.0000 mg | Freq: Once | INTRAVENOUS | Status: AC
  Administered 2014-12-01: 8 mg via INTRAVENOUS

## 2014-12-01 MED ORDER — HEPARIN SOD (PORK) LOCK FLUSH 100 UNIT/ML IV SOLN
500.0000 [IU] | Freq: Once | INTRAVENOUS | Status: AC
  Administered 2014-12-01: 500 [IU] via INTRAVENOUS
  Filled 2014-12-01: qty 5

## 2014-12-01 MED ORDER — SODIUM CHLORIDE 0.9 % IV SOLN
1000.0000 mL | Freq: Once | INTRAVENOUS | Status: AC
  Administered 2014-12-01: 1000 mL via INTRAVENOUS

## 2014-12-01 MED ORDER — SODIUM CHLORIDE 0.9 % IJ SOLN
10.0000 mL | INTRAMUSCULAR | Status: DC | PRN
  Administered 2014-12-01: 10 mL via INTRAVENOUS
  Filled 2014-12-01: qty 10

## 2014-12-01 NOTE — Telephone Encounter (Signed)
s.w. pt on phone and [place] desk nurse will give pt new sched

## 2014-12-01 NOTE — Progress Notes (Signed)
There is no scanner in exam room # 25.

## 2014-12-01 NOTE — Telephone Encounter (Signed)
   Provider input needed: weakness, possible dehydration, follow up need   Reason for call: "I think I'm dehydrated, I'm weak, not a big water and fluid drinker anyway and I'm not getting enough in.  Throat is a little sore and I have nausea but no vomiting."  Constitutional: positive for fatigue and weakness Gastrointestinal: positive for constipation   ALLERGIES:  is allergic to gluten meal and penicillins.  Patient last received chemotherapy/ treatment on A/C on 11-25-2014  Patient was last seen in the office on 11-25-2014  Next appt is 12-02-2014  Is patient having fevers greater than 100.5?  no, Denies checking.   Is patient having uncontrolled pain, or new pain? no, "Only a sore throat."    Is patient having new back pain that changes with position (worsens or eases when laying down?)  no   Is patient able to eat and drink? Eating but not drinking well.    Is patient able to pass stool without difficulty?   no, "Took stool softener yesterday and it really hurt to have the movement."      Is patient having uncontrolled nausea?  no    patient calls 12/01/2014 with complaint of  Constitutional: positive for fatigue and weakness Gastrointestinal: positive for constipation I don't know if they want me to come in tomorrow or what.  It will take me an hour and a half to come in today if they want me to come in."     Summary Based on the above information advised patient to  Await return call as this nurse notifies providers.     Winston-Spruiell, Annebelle Bostic  12/01/2014, 9:23 AM   Background Info  [place]   DOB: 02-25-1976   MR#: [id number]   CSN#   [id number] 12/01/2014

## 2014-12-01 NOTE — Telephone Encounter (Signed)
Verbal order received and read back from Selena Lesser NP for labs and follow up today.  Called patient instructing to come in for lab and f/u with Symptom management clinic.  Request lab at 11:15 am

## 2014-12-01 NOTE — Patient Instructions (Signed)
Dehydration, Adult Dehydration is when you lose more fluids from the body than you take in. Vital organs like the kidneys, brain, and heart cannot function without a proper amount of fluids and salt. Any loss of fluids from the body can cause dehydration.  CAUSES   Vomiting.  Diarrhea.  Excessive sweating.  Excessive urine output.  Fever. SYMPTOMS  Mild dehydration  Thirst.  Dry lips.  Slightly dry mouth. Moderate dehydration  Very dry mouth.  Sunken eyes.  Skin does not bounce back quickly when lightly pinched and released.  Dark urine and decreased urine production.  Decreased tear production.  Headache. Severe dehydration  Very dry mouth.  Extreme thirst.  Rapid, weak pulse (more than 100 beats per minute at rest).  Cold hands and feet.  Not able to sweat in spite of heat and temperature.  Rapid breathing.  Blue lips.  Confusion and lethargy.  Difficulty being awakened.  Minimal urine production.  No tears. DIAGNOSIS  Your caregiver will diagnose dehydration based on your symptoms and your exam. Blood and urine tests will help confirm the diagnosis. The diagnostic evaluation should also identify the cause of dehydration. TREATMENT  Treatment of mild or moderate dehydration can often be done at home by increasing the amount of fluids that you drink. It is best to drink small amounts of fluid more often. Drinking too much at one time can make vomiting worse. Refer to the home care instructions below. Severe dehydration needs to be treated at the hospital where you will probably be given intravenous (IV) fluids that contain water and electrolytes. HOME CARE INSTRUCTIONS   Ask your caregiver about specific rehydration instructions.  Drink enough fluids to keep your urine clear or pale yellow.  Drink small amounts frequently if you have nausea and vomiting.  Eat as you normally do.  Avoid:  Foods or drinks high in sugar.  Carbonated  drinks.  Juice.  Extremely hot or cold fluids.  Drinks with caffeine.  Fatty, greasy foods.  Alcohol.  Tobacco.  Overeating.  Gelatin desserts.  Wash your hands well to avoid spreading bacteria and viruses.  Only take over-the-counter or prescription medicines for pain, discomfort, or fever as directed by your caregiver.  Ask your caregiver if you should continue all prescribed and over-the-counter medicines.  Keep all follow-up appointments with your caregiver. SEEK MEDICAL CARE IF:  You have abdominal pain and it increases or stays in one area (localizes).  You have a rash, stiff neck, or severe headache.  You are irritable, sleepy, or difficult to awaken.  You are weak, dizzy, or extremely thirsty. SEEK IMMEDIATE MEDICAL CARE IF:   You are unable to keep fluids down or you get worse despite treatment.  You have frequent episodes of vomiting or diarrhea.  You have blood or green matter (bile) in your vomit.  You have blood in your stool or your stool looks black and tarry.  You have not urinated in 6 to 8 hours, or you have only urinated a small amount of very dark urine.  You have a fever.  You faint. MAKE SURE YOU:   Understand these instructions.  Will watch your condition.  Will get help right away if you are not doing well or get worse. Document Released: 12/10/2005 Document Revised: 03/03/2012 Document Reviewed: 07/30/2011 Exit[employer] Patient Information 2015 [employer]. This information is not intended to replace advice given to you by your health care provider. Make sure you discuss any questions you have with your health care   provider.  

## 2014-12-02 ENCOUNTER — Other Ambulatory Visit: Payer: BC Managed Care – PPO

## 2014-12-02 ENCOUNTER — Other Ambulatory Visit: Payer: Self-pay | Admitting: Nurse Practitioner

## 2014-12-02 ENCOUNTER — Ambulatory Visit: Payer: BC Managed Care – PPO | Admitting: Nurse Practitioner

## 2014-12-02 ENCOUNTER — Encounter: Payer: Self-pay | Admitting: Nurse Practitioner

## 2014-12-02 DIAGNOSIS — R630 Anorexia: Secondary | ICD-10-CM | POA: Insufficient documentation

## 2014-12-02 DIAGNOSIS — E860 Dehydration: Secondary | ICD-10-CM

## 2014-12-02 DIAGNOSIS — R5383 Other fatigue: Secondary | ICD-10-CM | POA: Insufficient documentation

## 2014-12-02 DIAGNOSIS — R634 Abnormal weight loss: Secondary | ICD-10-CM | POA: Insufficient documentation

## 2014-12-02 DIAGNOSIS — R350 Frequency of micturition: Secondary | ICD-10-CM | POA: Insufficient documentation

## 2014-12-02 DIAGNOSIS — R110 Nausea: Secondary | ICD-10-CM | POA: Insufficient documentation

## 2014-12-02 DIAGNOSIS — I959 Hypotension, unspecified: Secondary | ICD-10-CM | POA: Insufficient documentation

## 2014-12-02 DIAGNOSIS — K5900 Constipation, unspecified: Secondary | ICD-10-CM | POA: Insufficient documentation

## 2014-12-02 NOTE — Assessment & Plan Note (Signed)
Patient is complaining of some mild urinary frequency; but denies any other UTI symptoms such as dysuria, hesitancy,flank pain, or odor.  Ordered a urinalysis; the patient was unable to give a urine sample today.  Patient was encouraged to bring in a urine sample or have urinalysis obtained when she returns to the [facility] this coming Friday, 12/03/2014.

## 2014-12-02 NOTE — Assessment & Plan Note (Signed)
Blood pressure decreased to 99/42 today.  Most likely, this is secondary to dehydration/chemotherapy.

## 2014-12-02 NOTE — Assessment & Plan Note (Signed)
Patient is complaining of some mild constipation.  She states she has been taking stool softeners; but has not tried Miralax.  Her last regular bowel movement was on 11/30/2014.  Patient was encouraged to continue with stool softener; and to take MiraLAX on a once daily basis if needed.

## 2014-12-02 NOTE — Assessment & Plan Note (Signed)
Chemotherapy-associated anorexia.  Patient was encouraged to eat multiple small meals throughout the day.

## 2014-12-02 NOTE — Assessment & Plan Note (Signed)
Patient received cycle 3 of her dose dense AC chemotherapy regimen on 11/25/2014.  She is scheduled to return on 12/08/2014 for labs and a follow-up visit.  She will receive cycle 4 of the same chemotherapy regimen on 12/10/2014.

## 2014-12-02 NOTE — Assessment & Plan Note (Signed)
Patient is complaining of some chronic nausea; but states she has not vomited.  She does take Zofran at home on an as-needed basis.  Patient was given Zofran in her IV fluid today.

## 2014-12-02 NOTE — Progress Notes (Signed)
will   SYMPTOM MANAGEMENT CLINIC   HPI: Misty [name] 38 y.o. female diagnosed with breast cancer.  Currently undergoing dose dense AC chemotherapy regimen.  Patient called the cancer Center today requesting urgent care visit.  She received cycle 2 of her dose dense AC chemotherapy regimen on the super third 2015.  Since that time-patient has been experiencing increasing fatigue, anorexia, and dehydration.  Patient is also complaining of some constipation; even though she does take stool softeners on a regular basis.  She's also complaining of some mild urinary frequency; but denies any other UTI symptoms whatsoever.  She denies any recent fevers or chills as well.   HPI  CURRENT THERAPY: Upcoming Treatment Dates - BREAST ADJUVANT DOSE DENSE AC q14d Days with orders from any treatment category:  12/09/2014      lidocaine-prilocaine (EMLA) cream      SCHEDULING COMMUNICATION      palonosetron (ALOXI) injection 0.25 mg      Dexamethasone Sodium Phosphate (DECADRON) injection 12 mg      fosaprepitant (EMEND) 150 mg in sodium chloride 0.9 % 145 mL IVPB      DOXOrubicin (ADRIAMYCIN) chemo injection 92 mg      cyclophosphamide (CYTOXAN) 920 mg in sodium chloride 0.9 % 250 mL chemo infusion      sodium chloride 0.9 % injection 10 mL      heparin lock flush 100 unit/mL      heparin lock flush 100 unit/mL      alteplase (CATHFLO ACTIVASE) injection 2 mg      sodium chloride 0.9 % injection 3 mL      Cold Pack 1 packet      0.9 %  sodium chloride infusion      TREATMENT CONDITIONS 12/10/2014      lidocaine-prilocaine (EMLA) cream      SCHEDULING COMMUNICATION INJECTION      pegfilgrastim (NEULASTA) injection 6 mg 12/23/2014      lidocaine-prilocaine (EMLA) cream      SCHEDULING COMMUNICATION      palonosetron (ALOXI) injection 0.25 mg      Dexamethasone Sodium Phosphate (DECADRON) injection 12 mg      fosaprepitant (EMEND) 150 mg in sodium chloride 0.9 % 145 mL IVPB      DOXOrubicin  (ADRIAMYCIN) chemo injection 92 mg      cyclophosphamide (CYTOXAN) 920 mg in sodium chloride 0.9 % 250 mL chemo infusion      sodium chloride 0.9 % injection 10 mL      heparin lock flush 100 unit/mL      heparin lock flush 100 unit/mL      alteplase (CATHFLO ACTIVASE) injection 2 mg      sodium chloride 0.9 % injection 3 mL      Cold Pack 1 packet      0.9 %  sodium chloride infusion      TREATMENT CONDITIONS    ROS  Past Medical History  Diagnosis Date  . Thyroid disease   . Celiac disease   . PONV (postoperative nausea and vomiting)   . Hypothyroidism   . Anxiety   . Headache(784.0)   . Cancer     breast cancer    Past Surgical History  Procedure Laterality Date  . Cesarean section    . Gallbladder surgery    . Cholecystectomy    . Simple mastectomy with axillary sentinel node biopsy Right 09/28/2014    Procedure: RIGHT TOTAL MASTECTOMY, RIGHT  AXILLARY SENTINEL NODE BIOPSY;  Surgeon: Fanny Skates,  MD;  Location: [facility];  Service: General;  Laterality: Right;  . Portacath placement Right 10/22/2014    Procedure: INSERTION PORT-A-CATH;  Surgeon: Fanny Skates, MD;  Location: [facility];  Service: General;  Laterality: Right;    has Breast cancer of lower-outer quadrant of right female breast; Family history of malignant neoplasm of breast; Celiac disease; Dehydration; Nausea; Anorexia; Weight loss; Constipation; Fatigue; Hypotension; and Urinary frequency on her problem list.     is allergic to gluten meal and penicillins.    Medication List       This list is accurate as of: 12/01/14 11:59 PM.  Always use your most recent med list.               dexamethasone 4 MG tablet  Commonly known as:  DECADRON  Take 2 tablets by mouth once a day on the day after chemotherapy and then take 2 tablets two times a day for 2 days. Take with food.     HYDROcodone-acetaminophen 5-325 MG per tablet  Commonly known as:  NORCO  Take 1-2  tablets by mouth every 6 (six) hours as needed for moderate pain or severe pain.     lidocaine-prilocaine cream  Commonly known as:  EMLA  Apply 1 application topically as needed. Apply over port area 1-2 hours before chemotherapy, cover with plastic wrap     lidocaine-prilocaine cream  Commonly known as:  EMLA  Apply 1 application topically as needed. Apply over port area 1-2 hours before chemotherapy, cover with plastic wrap     LORazepam 0.5 MG tablet  Commonly known as:  ATIVAN  Take at bedtime as directed     ondansetron 8 MG tablet  Commonly known as:  ZOFRAN  Take 1 tablet (8 mg total) by mouth 2 (two) times daily.     prochlorperazine 10 MG tablet  Commonly known as:  COMPAZINE  Take every 6 hours by mouth as directed     SYNTHROID 100 MCG tablet  Generic drug:  levothyroxine         PHYSICAL EXAMINATION  Blood pressure 110/60, pulse 80, temperature 98.2 F (36.8 C), temperature source Oral, resp. rate 18, height 5' 1"  (1.549 m), weight 115 lb 11.2 oz (52.481 kg), SpO2 100 %.  Physical Exam  Constitutional: She is oriented to person, place, and time. Vital signs are normal. She appears dehydrated. She appears unhealthy. She appears cachectic.  HENT:  Head: Normocephalic and atraumatic.  Mouth/Throat: Oropharynx is clear and moist.  Eyes: Conjunctivae and EOM are normal. Pupils are equal, round, and reactive to light. Right eye exhibits no discharge. Left eye exhibits no discharge. No scleral icterus.  Neck: Normal range of motion. Neck supple. No JVD present. No tracheal deviation present. No thyromegaly present.  Cardiovascular: Normal rate, regular rhythm, normal heart sounds and intact distal pulses.   Pulmonary/Chest: Effort normal and breath sounds normal. No respiratory distress. She has no wheezes. She has no rales. She exhibits no tenderness.  Abdominal: Soft. Bowel sounds are normal. She exhibits no distension and no mass. There is no tenderness. There is no  rebound and no guarding.  Musculoskeletal: Normal range of motion. She exhibits no edema or tenderness.  Lymphadenopathy:    She has no cervical adenopathy.  Neurological: She is alert and oriented to person, place, and time. Gait normal.  Skin: Skin is warm and dry. No rash noted. No erythema.  Psychiatric: Affect normal.  Nursing note and vitals reviewed.  LABORATORY DATA:. Appointment on 12/01/2014  Component Date Value Ref Range Status  . WBC 12/01/2014 1.0* 3.9 - 10.3 10e3/uL Final  . NEUT# 12/01/2014 0.6* 1.5 - 6.5 10e3/uL Final  . HGB 12/01/2014 12.4  11.6 - 15.9 g/dL Final  . HCT 12/01/2014 36.2  34.8 - 46.6 % Final  . Platelets 12/01/2014 105* 145 - 400 10e3/uL Final  . MCV 12/01/2014 91.2  79.5 - 101.0 fL Final  . MCH 12/01/2014 31.2  25.1 - 34.0 pg Final  . MCHC 12/01/2014 34.3  31.5 - 36.0 g/dL Final  . RBC 12/01/2014 3.97  3.70 - 5.45 10e6/uL Final  . RDW 12/01/2014 13.4  11.2 - 14.5 % Final  . lymph# 12/01/2014 0.3* 0.9 - 3.3 10e3/uL Final  . MONO# 12/01/2014 0.0* 0.1 - 0.9 10e3/uL Final  . Eosinophils Absolute 12/01/2014 0.0  0.0 - 0.5 10e3/uL Final  . Basophils Absolute 12/01/2014 0.0  0.0 - 0.1 10e3/uL Final  . NEUT% 12/01/2014 60.9  38.4 - 76.8 % Final  . LYMPH% 12/01/2014 30.9  14.0 - 49.7 % Final  . MONO% 12/01/2014 3.1  0.0 - 14.0 % Final  . EOS% 12/01/2014 4.1  0.0 - 7.0 % Final  . BASO% 12/01/2014 1.0  0.0 - 2.0 % Final  . nRBC 12/01/2014 0  0 - 0 % Final  . Sodium 12/01/2014 138  136 - 145 mEq/L Final  . Potassium 12/01/2014 4.9  3.5 - 5.1 mEq/L Final  . Chloride 12/01/2014 101  98 - 109 mEq/L Final  . CO2 12/01/2014 28  22 - 29 mEq/L Final  . Glucose 12/01/2014 88  70 - 140 mg/dl Final  . BUN 12/01/2014 9.5  7.0 - 26.0 mg/dL Final  . Creatinine 12/01/2014 0.7  0.6 - 1.1 mg/dL Final  . Total Bilirubin 12/01/2014 0.71  0.20 - 1.20 mg/dL Final  . Alkaline Phosphatase 12/01/2014 107  40 - 150 U/L Final  . AST 12/01/2014 13  5 - 34 U/L Final  . ALT  12/01/2014 16  0 - 55 U/L Final  . Total Protein 12/01/2014 6.2* 6.4 - 8.3 g/dL Final  . Albumin 12/01/2014 3.5  3.5 - 5.0 g/dL Final  . Calcium 12/01/2014 9.0  8.4 - 10.4 mg/dL Final  . Anion Gap 12/01/2014 9  3 - 11 mEq/L Final  . EGFR 12/01/2014 >90  >90 ml/min/1.73 m2 Final   eGFR is calculated using the CKD-EPI Creatinine Equation (2009)     RADIOGRAPHIC STUDIES: No results found.  ASSESSMENT/PLAN:    Anorexia Chemotherapy-associated anorexia.  Patient was encouraged to eat multiple small meals throughout the day.  Breast cancer of lower-outer quadrant of right female breast Patient received cycle 3 of her dose dense AC chemotherapy regimen on 11/25/2014.  She is scheduled to return on 12/08/2014 for labs and a follow-up visit.  She will receive cycle 4 of the same chemotherapy regimen on 12/10/2014.  Constipation Patient is complaining of some mild constipation.  She states she has been taking stool softeners; but has not tried Miralax.  Her last regular bowel movement was on 11/30/2014.  Patient was encouraged to continue with stool softener; and to take MiraLAX on a once daily basis if needed.  Dehydration Patient is complaining of minimal oral intake; stating that she has no appetite.  Patient does appear dehydrated today.  Patient will receive 1 L normal saline IV fluid rehydration today.  She will also return this coming Friday, 12/03/2014 for an additional  IV fluid rehydration.  Fatigue Patient is complaining of worsening fatigue this week.  Most likely, this is secondary to both chemotherapy and worsening dehydration.  Hopefully, IV fluid rehydration will help with her complain of fatigue.  Also advised patient to remain as active as possible on a daily basis.  Hypotension Blood pressure decreased to 99/42 today.  Most likely, this is secondary to dehydration/chemotherapy.  Nausea Patient is complaining of some chronic nausea; but states she has not vomited.  She does  take Zofran at home on an as-needed basis.  Patient was given Zofran in her IV fluid today.  Urinary frequency Patient is complaining of some mild urinary frequency; but denies any other UTI symptoms such as dysuria, hesitancy,flank pain, or odor.  Ordered a urinalysis; the patient was unable to give a urine sample today.  Patient was encouraged to bring in a urine sample or have urinalysis obtained when she returns to the [facility] this coming Friday, 12/03/2014.    Weight loss Patient has once again lost weight since her last weight check.  Patient was encouraged to eat multiple small meals throughout the day.  Patient stated understanding of all instructions; and was in agreement with this plan of care. The patient knows to call the clinic with any problems, questions or concerns.   Review/collaboration with Dr. Jana Hakim regarding all aspects of patient's visit today.   Total time spent with patient was 40 minutes;  with greater than 75 percent of that time spent in face to face counseling regarding her symptoms, frequent monitoring while patient was receiving her IV fluids, and coordination of care and follow up.  Disclaimer: This note was dictated with voice recognition software. Similar sounding words can inadvertently be transcribed and may not be corrected upon review.   Drue Second, NP 12/02/2014

## 2014-12-02 NOTE — Assessment & Plan Note (Signed)
Patient has once again lost weight since her last weight check.  Patient was encouraged to eat multiple small meals throughout the day.

## 2014-12-02 NOTE — Assessment & Plan Note (Signed)
Patient is complaining of minimal oral intake; stating that she has no appetite.  Patient does appear dehydrated today.  Patient will receive 1 L normal saline IV fluid rehydration today.  She will also return this coming Friday, 12/03/2014 for an additional  IV fluid rehydration.

## 2014-12-02 NOTE — Assessment & Plan Note (Signed)
Patient is complaining of worsening fatigue this week.  Most likely, this is secondary to both chemotherapy and worsening dehydration.  Hopefully, IV fluid rehydration will help with her complain of fatigue.  Also advised patient to remain as active as possible on a daily basis.

## 2014-12-03 ENCOUNTER — Ambulatory Visit (HOSPITAL_BASED_OUTPATIENT_CLINIC_OR_DEPARTMENT_OTHER): Payer: BC Managed Care – PPO

## 2014-12-03 VITALS — BP 100/69 | HR 95 | Temp 98.0°F | Resp 16

## 2014-12-03 DIAGNOSIS — E860 Dehydration: Secondary | ICD-10-CM

## 2014-12-03 DIAGNOSIS — C50511 Malignant neoplasm of lower-outer quadrant of right female breast: Secondary | ICD-10-CM

## 2014-12-03 DIAGNOSIS — R110 Nausea: Secondary | ICD-10-CM

## 2014-12-03 LAB — URINALYSIS, MICROSCOPIC - CHCC
Bilirubin (Urine): NEGATIVE
Blood: NEGATIVE
Glucose: NEGATIVE mg/dL
KETONES: NEGATIVE mg/dL
LEUKOCYTE ESTERASE: NEGATIVE
NITRITE: NEGATIVE
PH: 7.5 (ref 4.6–8.0)
Protein: NEGATIVE mg/dL
RBC / HPF: NEGATIVE (ref 0–2)
Specific Gravity, Urine: 1.015 (ref 1.003–1.035)
UROBILINOGEN UR: 0.2 mg/dL (ref 0.2–1)

## 2014-12-03 MED ORDER — HEPARIN SOD (PORK) LOCK FLUSH 100 UNIT/ML IV SOLN
500.0000 [IU] | Freq: Once | INTRAVENOUS | Status: AC
  Administered 2014-12-03: 500 [IU] via INTRAVENOUS
  Filled 2014-12-03: qty 5

## 2014-12-03 MED ORDER — SODIUM CHLORIDE 0.9 % IV SOLN
1000.0000 mL | INTRAVENOUS | Status: AC
  Administered 2014-12-03: 1000 mL via INTRAVENOUS

## 2014-12-03 MED ORDER — ONDANSETRON 8 MG/50ML IVPB (CHCC)
8.0000 mg | Freq: Once | INTRAVENOUS | Status: AC
  Administered 2014-12-03: 8 mg via INTRAVENOUS

## 2014-12-03 MED ORDER — SODIUM CHLORIDE 0.9 % IJ SOLN
10.0000 mL | INTRAMUSCULAR | Status: DC | PRN
  Administered 2014-12-03: 10 mL via INTRAVENOUS
  Filled 2014-12-03: qty 10

## 2014-12-03 MED ORDER — ONDANSETRON 8 MG/NS 50 ML IVPB
INTRAVENOUS | Status: AC
  Filled 2014-12-03: qty 8

## 2014-12-03 NOTE — Patient Instructions (Signed)
Dehydration, Adult Dehydration is when you lose more fluids from the body than you take in. Vital organs like the kidneys, brain, and heart cannot function without a proper amount of fluids and salt. Any loss of fluids from the body can cause dehydration.  CAUSES   Vomiting.  Diarrhea.  Excessive sweating.  Excessive urine output.  Fever. SYMPTOMS  Mild dehydration  Thirst.  Dry lips.  Slightly dry mouth. Moderate dehydration  Very dry mouth.  Sunken eyes.  Skin does not bounce back quickly when lightly pinched and released.  Dark urine and decreased urine production.  Decreased tear production.  Headache. Severe dehydration  Very dry mouth.  Extreme thirst.  Rapid, weak pulse (more than 100 beats per minute at rest).  Cold hands and feet.  Not able to sweat in spite of heat and temperature.  Rapid breathing.  Blue lips.  Confusion and lethargy.  Difficulty being awakened.  Minimal urine production.  No tears. DIAGNOSIS  Your caregiver will diagnose dehydration based on your symptoms and your exam. Blood and urine tests will help confirm the diagnosis. The diagnostic evaluation should also identify the cause of dehydration. TREATMENT  Treatment of mild or moderate dehydration can often be done at home by increasing the amount of fluids that you drink. It is best to drink small amounts of fluid more often. Drinking too much at one time can make vomiting worse. Refer to the home care instructions below. Severe dehydration needs to be treated at the hospital where you will probably be given intravenous (IV) fluids that contain water and electrolytes. HOME CARE INSTRUCTIONS   Ask your caregiver about specific rehydration instructions.  Drink enough fluids to keep your urine clear or pale yellow.  Drink small amounts frequently if you have nausea and vomiting.  Eat as you normally do.  Avoid:  Foods or drinks high in sugar.  Carbonated  drinks.  Juice.  Extremely hot or cold fluids.  Drinks with caffeine.  Fatty, greasy foods.  Alcohol.  Tobacco.  Overeating.  Gelatin desserts.  Wash your hands well to avoid spreading bacteria and viruses.  Only take over-the-counter or prescription medicines for pain, discomfort, or fever as directed by your caregiver.  Ask your caregiver if you should continue all prescribed and over-the-counter medicines.  Keep all follow-up appointments with your caregiver. SEEK MEDICAL CARE IF:  You have abdominal pain and it increases or stays in one area (localizes).  You have a rash, stiff neck, or severe headache.  You are irritable, sleepy, or difficult to awaken.  You are weak, dizzy, or extremely thirsty. SEEK IMMEDIATE MEDICAL CARE IF:   You are unable to keep fluids down or you get worse despite treatment.  You have frequent episodes of vomiting or diarrhea.  You have blood or green matter (bile) in your vomit.  You have blood in your stool or your stool looks black and tarry.  You have not urinated in 6 to 8 hours, or you have only urinated a small amount of very dark urine.  You have a fever.  You faint. MAKE SURE YOU:   Understand these instructions.  Will watch your condition.  Will get help right away if you are not doing well or get worse. Document Released: 12/10/2005 Document Revised: 03/03/2012 Document Reviewed: 07/30/2011 [facility] Patient Information 2015 [place], [place]. This information is not intended to replace advice given to you by your health care provider. Make sure you discuss any questions you have with your health care  provider.

## 2014-12-04 LAB — URINE CULTURE

## 2014-12-08 ENCOUNTER — Ambulatory Visit (HOSPITAL_BASED_OUTPATIENT_CLINIC_OR_DEPARTMENT_OTHER): Payer: BC Managed Care – PPO | Admitting: Nurse Practitioner

## 2014-12-08 ENCOUNTER — Telehealth: Payer: Self-pay | Admitting: Nurse Practitioner

## 2014-12-08 ENCOUNTER — Encounter: Payer: Self-pay | Admitting: Nurse Practitioner

## 2014-12-08 ENCOUNTER — Other Ambulatory Visit (HOSPITAL_BASED_OUTPATIENT_CLINIC_OR_DEPARTMENT_OTHER): Payer: BC Managed Care – PPO

## 2014-12-08 VITALS — BP 110/52 | HR 92 | Temp 97.5°F | Resp 18 | Ht 61.0 in | Wt 121.3 lb

## 2014-12-08 DIAGNOSIS — C50511 Malignant neoplasm of lower-outer quadrant of right female breast: Secondary | ICD-10-CM

## 2014-12-08 DIAGNOSIS — C50311 Malignant neoplasm of lower-inner quadrant of right female breast: Secondary | ICD-10-CM

## 2014-12-08 DIAGNOSIS — E860 Dehydration: Secondary | ICD-10-CM

## 2014-12-08 DIAGNOSIS — Z803 Family history of malignant neoplasm of breast: Secondary | ICD-10-CM

## 2014-12-08 DIAGNOSIS — K5900 Constipation, unspecified: Secondary | ICD-10-CM

## 2014-12-08 DIAGNOSIS — K900 Celiac disease: Secondary | ICD-10-CM

## 2014-12-08 DIAGNOSIS — Z170 Estrogen receptor positive status [ER+]: Secondary | ICD-10-CM

## 2014-12-08 LAB — CBC WITH DIFFERENTIAL/PLATELET
BASO%: 0.8 % (ref 0.0–2.0)
Basophils Absolute: 0.1 10*3/uL (ref 0.0–0.1)
EOS%: 0.1 % (ref 0.0–7.0)
Eosinophils Absolute: 0 10*3/uL (ref 0.0–0.5)
HCT: 32.8 % — ABNORMAL LOW (ref 34.8–46.6)
HGB: 10.8 g/dL — ABNORMAL LOW (ref 11.6–15.9)
LYMPH%: 6.4 % — ABNORMAL LOW (ref 14.0–49.7)
MCH: 30.4 pg (ref 25.1–34.0)
MCHC: 33 g/dL (ref 31.5–36.0)
MCV: 92 fL (ref 79.5–101.0)
MONO#: 0.7 10*3/uL (ref 0.1–0.9)
MONO%: 5.5 % (ref 0.0–14.0)
NEUT#: 10.7 10*3/uL — ABNORMAL HIGH (ref 1.5–6.5)
NEUT%: 87.2 % — ABNORMAL HIGH (ref 38.4–76.8)
PLATELETS: 196 10*3/uL (ref 145–400)
RBC: 3.56 10*6/uL — ABNORMAL LOW (ref 3.70–5.45)
RDW: 13.2 % (ref 11.2–14.5)
WBC: 12.3 10*3/uL — AB (ref 3.9–10.3)
lymph#: 0.8 10*3/uL — ABNORMAL LOW (ref 0.9–3.3)

## 2014-12-08 LAB — COMPREHENSIVE METABOLIC PANEL (CC13)
ALT: 18 U/L (ref 0–55)
AST: 16 U/L (ref 5–34)
Albumin: 3.6 g/dL (ref 3.5–5.0)
Alkaline Phosphatase: 90 U/L (ref 40–150)
Anion Gap: 9 mEq/L (ref 3–11)
BUN: 8.9 mg/dL (ref 7.0–26.0)
CO2: 25 meq/L (ref 22–29)
CREATININE: 0.7 mg/dL (ref 0.6–1.1)
Calcium: 9.2 mg/dL (ref 8.4–10.4)
Chloride: 108 mEq/L (ref 98–109)
EGFR: >90 mL/min/{1.73_m2} (ref >90)
GLUCOSE: 86 mg/dL (ref 70–140)
Potassium: 4.6 mEq/L (ref 3.5–5.1)
Sodium: 141 mEq/L (ref 136–145)
Total Protein: 6.1 g/dL — ABNORMAL LOW (ref 6.4–8.3)

## 2014-12-08 NOTE — Telephone Encounter (Signed)
per pof to sch pt appt-gave pt copy of sch-[facility]ame[facility]a sch IV fluids-trmt

## 2014-12-08 NOTE — Progress Notes (Signed)
[place]  Telephone:([phone]) [phone] Fax:([phone]) [phone]     ID: Misty [name] DOB: 19-Oct-1976  MR#: [id number]  UXN#:[id number]  Patient Care Team: Glenda Chroman, MD as PCP - General (Internal Medicine) Fanny Skates, MD as Consulting Physician (General Surgery) Chauncey Cruel, MD as Consulting Physician (Oncology) PCP: Glenda Chroman., MD Santo Held, MD (GYN) Crissie Reese M.D. (Plastics)  CHIEF COMPLAINT: Estrogen receptor positive breast cancer CURRENT TREATMENT: adjuvant chemotherapy    BREAST CANCER HISTORY: From the original intake note:  Misty [name] was closely followed for some right breast changes between 20 11/12/2012. Everything seemed stable at that time. In 2014 she was promoted in her job, was working very hard, and actually missed having a mammogram. Late in January 2015 she noted some pain in her right breast and by February when she lifted her arms she noted that the nipple was tilting sideways and seemed a little bit of gathered. She didn't think much of this however. It was not until July that she noted a palpable mass in the lateral right breast. She brought this to her gynecologist's attention and she was set up for diagnostic mammography at [facility] 08/04/2014.   There was an area of distortion in the lateral aspect of the right breast noted on mammography,  corresponding to a palpable firm mass at the 8:00 position of the right breast. Ultrasound confirmed a 2.4 cm irregular hypoechoic spiculated mass in the right breast at the 8:00 position and in addition at the 5:00 position 1 cm from the nipple there was a taller than wide irregular hypoechoic mass measuring 0.8 cm. There was no right axillary adenopathy. In the left breast upper-outer quadrant there was a minimally complicated cyst measuring 0.7 cm.  On 08/11/2014 the patient underwent biopsy of the 2 right breast masses noted on ultrasound. Both showed invasive ductal carcinoma, grade 1,  estrogen receptor 90% positive, progesterone receptor 90% positive, both with strong staining intensity, HER-2 equivocal at 2+ but negative by FISH, with the signals ratio being 1.20 and the number per nucleus 2.1. The proliferation fraction by MIB-1 was 17% ([id number]; this report was reviewed by our pathologist, who concurred, [name] [id number]).  On 08/25/2014 the patient underwent bilateral breast MRI at [place]. This showed, in the right breast, an irregular enhancing mass at the 8:00 position measuring 2.7 cm. Also in the right breast at the 6:00 position there was another enhancing mass measuring 0.6 cm, located 2 cm away from the larger mass. There were also masses in the retroareolar position measuring 0.7 cm, in the posterior central right breast measuring 0.5 cm, and another one just inferior to the midline measuring 5.5 cm. There were no masses or findings of concern in the left breast and no abnormal appearing lymph nodes.  The patient's subsequent history is as detailed below  INTERVAL HISTORY: Misty [name] returns today for follow up of her breast cancer. Today is day 8, cycle 2 of adjuvant doxorubicin and cyclophosphamide given every 2 weeks with Neulasta on day 2. [name] visited our urgent care clinic on 12/02/14 with complaints of fatigue, constipation, urinary frequency, anorexia, and dehydration. She was given IV fluids that day and the next, and encouraged to start stool softeners for her constipation. A urine sample was collected, but was negative for a UTI. Since this visit she feels much improved. She denies pain, fevers, chills, nausea, or vomiting. Because of her gluten allergy she has to use colace "clear" once daily and this is somewhat  helpful. Her appetite has improved and she is back up to 121lb. Her energy level has increased somewhat. She does not sleep well past 4am, but prefers not to take any lorazepam. She denies mouth sores, rashes, or peripheral neuropathy  symptoms.  REVIEW OF SYSTEMS: A detailed review of systems is otherwise negative except where noted above.    PAST MEDICAL HISTORY: Past Medical History  Diagnosis Date  . Thyroid disease   . Celiac disease   . PONV (postoperative nausea and vomiting)   . Hypothyroidism   . Anxiety   . Headache(784.0)   . Cancer     breast cancer    PAST SURGICAL HISTORY: Past Surgical History  Procedure Laterality Date  . Cesarean section    . Gallbladder surgery    . Cholecystectomy    . Simple mastectomy with axillary sentinel node biopsy Right 09/28/2014    Procedure: RIGHT TOTAL MASTECTOMY, RIGHT  AXILLARY SENTINEL NODE BIOPSY;  Surgeon: Fanny Skates, MD;  Location: [place];  Service: General;  Laterality: Right;  . Portacath placement Right 10/22/2014    Procedure: INSERTION PORT-A-CATH;  Surgeon: Fanny Skates, MD;  Location: [place];  Service: General;  Laterality: Right;    FAMILY HISTORY Family History  Problem Relation Age of Onset  . Skin cancer Father   . Breast cancer Maternal Grandmother     dx unknown age  . Breast cancer Other 74    mat great aunt through [facility] with breast cancer   the patient's parents are both living, in their late 73s. The patient had one brother, no sisters. The patient's maternal grandmother was diagnosed with breast cancer in her late 63s. One of that grandmother's sisters was also diagnosed with breast cancer, in her 61s. There is no other history of breast or ovarian cancer in the family  GYNECOLOGIC HISTORY:  No LMP recorded. Menarche age 33, first live birth age 36, the patient is GX P1. She is still having regular periods. 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 Her husband, Misty [name], works as a Engineer, structural in [place]. Their son Misty [name] is 89. The patient  attends a local [employer]: Not in place   HEALTH MAINTENANCE: History  Substance Use Topics  . Smoking status: Never Smoker   . Smokeless tobacco: Not on file  . Alcohol Use: No     Colonoscopy:  PAP: 07/29/2014  Bone density:  Lipid panel:  Allergies  Allergen Reactions  . Gluten Meal   . Penicillins Nausea And Vomiting    Current Outpatient Prescriptions  Medication Sig Dispense Refill  . dexamethasone (DECADRON) 4 MG tablet Take 2 tablets by mouth once a day on the day after chemotherapy and then take 2 tablets two times a day for 2 days. Take with food. 30 tablet 1  . lidocaine-prilocaine (EMLA) cream Apply 1 application topically as needed. Apply over port area 1-2 hours before chemotherapy, cover with plastic wrap 30 g 0  . LORazepam (ATIVAN) 0.5 MG tablet Take at bedtime as directed 30 tablet 0  . ondansetron (ZOFRAN) 8 MG tablet Take 1 tablet (8 mg total) by mouth 2 (two) times daily. 30 tablet 1  . SYNTHROID 100 MCG tablet     . HYDROcodone-acetaminophen (NORCO) 5-325 MG per tablet Take 1-2 tablets by mouth every 6 (six)  hours as needed for moderate pain or severe pain. (Patient not taking: Reported on 12/08/2014) 30 tablet 0  . prochlorperazine (COMPAZINE) 10 MG tablet Take every 6 hours by mouth as directed (Patient not taking: Reported on 12/08/2014) 30 tablet 1   No current facility-administered medications for this visit.    OBJECTIVE: Middle-aged white woman who appears stated age 61 Vitals:   12/08/14 0948  BP: 110/52  Pulse:   Temp:   Resp:      Body mass index is 22.93 kg/(m^2).    ECOG FS:1 - Symptomatic but completely ambulatory  Sclerae unicteric, pupils equal and reactive Oropharynx clear and moist-- no thrush No cervical or supraclavicular adenopathy Lungs no rales or rhonchi Heart regular rate and rhythm Abd soft, nontender, positive bowel sounds MSK no focal spinal tenderness, no upper extremity  lymphedema Neuro: nonfocal, well oriented, appropriate affect Breasts: deferred  LAB RESULTS:  CMP     Component Value Date/Time   NA 141 12/08/2014 0921   NA 142 09/23/2014 1456   K 4.6 12/08/2014 0921   K 3.9 09/23/2014 1456   CL 104 09/23/2014 1456   CO2 25 12/08/2014 0921   CO2 27 09/23/2014 1456   GLUCOSE 86 12/08/2014 0921   GLUCOSE 71 09/23/2014 1456   BUN 8.9 12/08/2014 0921   BUN 11 09/23/2014 1456   CREATININE 0.7 12/08/2014 0921   CREATININE 0.70 09/23/2014 1456   CALCIUM 9.2 12/08/2014 0921   CALCIUM 9.8 09/23/2014 1456   PROT 6.1* 12/08/2014 0921   PROT 7.7 09/23/2014 1456   ALBUMIN 3.6 12/08/2014 0921   ALBUMIN 4.0 09/23/2014 1456   AST 16 12/08/2014 0921   AST 19 09/23/2014 1456   ALT 18 12/08/2014 0921   ALT 12 09/23/2014 1456   ALKPHOS 90 12/08/2014 0921   ALKPHOS 60 09/23/2014 1456   BILITOT <0.20 12/08/2014 0921   BILITOT 0.5 09/23/2014 1456   GFRNONAA >90 09/23/2014 1456   GFRAA >90 09/23/2014 1456    I No results found for: SPEP  Lab Results  Component Value Date   WBC 12.3* 12/08/2014   NEUTROABS 10.7* 12/08/2014   HGB 10.8* 12/08/2014   HCT 32.8* 12/08/2014   MCV 92.0 12/08/2014   PLT 196 12/08/2014      Chemistry      Component Value Date/Time   NA 141 12/08/2014 0921   NA 142 09/23/2014 1456   K 4.6 12/08/2014 0921   K 3.9 09/23/2014 1456   CL 104 09/23/2014 1456   CO2 25 12/08/2014 0921   CO2 27 09/23/2014 1456   BUN 8.9 12/08/2014 0921   BUN 11 09/23/2014 1456   CREATININE 0.7 12/08/2014 0921   CREATININE 0.70 09/23/2014 1456      Component Value Date/Time   CALCIUM 9.2 12/08/2014 0921   CALCIUM 9.8 09/23/2014 1456   ALKPHOS 90 12/08/2014 0921   ALKPHOS 60 09/23/2014 1456   AST 16 12/08/2014 0921   AST 19 09/23/2014 1456   ALT 18 12/08/2014 0921   ALT 12 09/23/2014 1456   BILITOT <0.20 12/08/2014 0921   BILITOT 0.5 09/23/2014 1456       No results found for: LABCA2  No components found for: LABCA125  No  results for input(s): INR in the last 168 hours.  Urinalysis No results found for: COLORURINE  STUDIES: Most recent echocardiogram on 10/27/14 showed an ejection fraction of 50-55%  No results found.  ASSESSMENT: 38 y.o. [place], [place] woman status post biopsy of 2 separate right breast  masses 08/11/2014, both positive for an invasive ductal carcinoma, grade 1, estrogen and progesterone receptor strongly positive, with an MIB-1 of 19%, and HER-2 nonamplified  (1) genetics testing sent 08/26/2014, showing no mutations in the BRCA genes; there were also no mutations in a TM, BARD 1, BRIP1, CDH 1, CHE K2, MR E11A, M UT YH, N BN, NF1, PA L B2, PTE N, RAD 50, RAD 50 1C, RAD 50 1D, and T p53.  (2) status post right mastectomy with sentinel lymph node sampling 09/28/2014 for an mpT2 pN1, stage IIB invasive breast cancer (with both ductal and lobular features), grade 2, with negative margins  (3) adjuvant chemotherapy to consist of doxorubicin and cyclophosphamide and dose dense fashion 4 with Neulasta support, beginning 11/11/2014, to be followed by paclitaxel  (4) radiation to follow chemotherapy.  (5) anti-estrogens to follow radiation  (6) history of celiac disease  PLAN: Naudia is doing well today. The labs were reviewed in detail and were entirely stable. She will proceed with day 1, cycle 3 of doxorubicin and cyclophosphamide tomorrow, with neulasta and IV fluids on Friday. She will return for an additional liter of IV fluids on Saturday, Monday, and Tuesday, per patient request. She tends to have minimal intake on these days and this will save her an urgent care visit.   I have advised that she increase her intake of the colace "clear" to BID to help with her constipation.   Reverie will return next week for labs and a nadir visit. She understands and agrees with this plan. She knows the goal of treatment in her case is cure. She has been encouraged to call with any issues that might  arise before her next visit here.   Marcelino Duster, NP   12/08/2014 10:05 AM

## 2014-12-09 ENCOUNTER — Ambulatory Visit (HOSPITAL_BASED_OUTPATIENT_CLINIC_OR_DEPARTMENT_OTHER): Payer: BC Managed Care – PPO

## 2014-12-09 ENCOUNTER — Other Ambulatory Visit: Payer: BC Managed Care – PPO

## 2014-12-09 ENCOUNTER — Ambulatory Visit: Payer: BC Managed Care – PPO | Admitting: Nurse Practitioner

## 2014-12-09 DIAGNOSIS — Z5111 Encounter for antineoplastic chemotherapy: Secondary | ICD-10-CM

## 2014-12-09 DIAGNOSIS — C50311 Malignant neoplasm of lower-inner quadrant of right female breast: Secondary | ICD-10-CM | POA: Diagnosis not present

## 2014-12-09 DIAGNOSIS — Z803 Family history of malignant neoplasm of breast: Secondary | ICD-10-CM

## 2014-12-09 DIAGNOSIS — C50511 Malignant neoplasm of lower-outer quadrant of right female breast: Secondary | ICD-10-CM | POA: Diagnosis not present

## 2014-12-09 DIAGNOSIS — K900 Celiac disease: Secondary | ICD-10-CM

## 2014-12-09 MED ORDER — PALONOSETRON HCL INJECTION 0.25 MG/5ML
INTRAVENOUS | Status: AC
  Filled 2014-12-09: qty 5

## 2014-12-09 MED ORDER — DEXAMETHASONE SODIUM PHOSPHATE 20 MG/5ML IJ SOLN
INTRAMUSCULAR | Status: AC
  Filled 2014-12-09: qty 5

## 2014-12-09 MED ORDER — DOXORUBICIN HCL CHEMO IV INJECTION 2 MG/ML
60.0000 mg/m2 | Freq: Once | INTRAVENOUS | Status: AC
  Administered 2014-12-09: 92 mg via INTRAVENOUS
  Filled 2014-12-09: qty 46

## 2014-12-09 MED ORDER — FOSAPREPITANT DIMEGLUMINE INJECTION 150 MG
150.0000 mg | Freq: Once | INTRAVENOUS | Status: AC
  Administered 2014-12-09: 150 mg via INTRAVENOUS
  Filled 2014-12-09: qty 5

## 2014-12-09 MED ORDER — PALONOSETRON HCL INJECTION 0.25 MG/5ML
0.2500 mg | Freq: Once | INTRAVENOUS | Status: AC
  Administered 2014-12-09: 0.25 mg via INTRAVENOUS

## 2014-12-09 MED ORDER — SODIUM CHLORIDE 0.9 % IJ SOLN
10.0000 mL | INTRAMUSCULAR | Status: DC | PRN
  Administered 2014-12-09: 10 mL
  Filled 2014-12-09: qty 10

## 2014-12-09 MED ORDER — DEXAMETHASONE SODIUM PHOSPHATE 20 MG/5ML IJ SOLN
12.0000 mg | Freq: Once | INTRAMUSCULAR | Status: AC
  Administered 2014-12-09: 12 mg via INTRAVENOUS

## 2014-12-09 MED ORDER — SODIUM CHLORIDE 0.9 % IV SOLN
Freq: Once | INTRAVENOUS | Status: AC
  Administered 2014-12-09: 10:00:00 via INTRAVENOUS

## 2014-12-09 MED ORDER — HEPARIN SOD (PORK) LOCK FLUSH 100 UNIT/ML IV SOLN
500.0000 [IU] | Freq: Once | INTRAVENOUS | Status: AC | PRN
  Administered 2014-12-09: 500 [IU]
  Filled 2014-12-09: qty 5

## 2014-12-09 MED ORDER — SODIUM CHLORIDE 0.9 % IV SOLN
600.0000 mg/m2 | Freq: Once | INTRAVENOUS | Status: AC
  Administered 2014-12-09: 920 mg via INTRAVENOUS
  Filled 2014-12-09: qty 46

## 2014-12-09 NOTE — Patient Instructions (Signed)
[facility] Discharge Instructions for Patients Receiving Chemotherapy  Today you received the following chemotherapy agents Adriamycin/Cytoxan.   To help prevent nausea and vomiting after your treatment, we encourage you to take your nausea medication as directed.    If you develop nausea and vomiting that is not controlled by your nausea medication, call the clinic.   BELOW ARE SYMPTOMS THAT SHOULD BE REPORTED IMMEDIATELY:  *FEVER GREATER THAN 100.5 F  *CHILLS WITH OR WITHOUT FEVER  NAUSEA AND VOMITING THAT IS NOT CONTROLLED WITH YOUR NAUSEA MEDICATION  *UNUSUAL SHORTNESS OF BREATH  *UNUSUAL BRUISING OR BLEEDING  TENDERNESS IN MOUTH AND THROAT WITH OR WITHOUT PRESENCE OF ULCERS  *URINARY PROBLEMS  *BOWEL PROBLEMS  UNUSUAL RASH Items with * indicate a potential emergency and should be followed up as soon as possible.  Feel free to call the clinic you have any questions or concerns. The clinic phone number is [phone].    

## 2014-12-10 ENCOUNTER — Ambulatory Visit (HOSPITAL_BASED_OUTPATIENT_CLINIC_OR_DEPARTMENT_OTHER): Payer: BC Managed Care – PPO

## 2014-12-10 ENCOUNTER — Ambulatory Visit: Payer: BC Managed Care – PPO

## 2014-12-10 DIAGNOSIS — C50311 Malignant neoplasm of lower-inner quadrant of right female breast: Secondary | ICD-10-CM | POA: Diagnosis not present

## 2014-12-10 DIAGNOSIS — C50511 Malignant neoplasm of lower-outer quadrant of right female breast: Secondary | ICD-10-CM | POA: Diagnosis not present

## 2014-12-10 DIAGNOSIS — Z803 Family history of malignant neoplasm of breast: Secondary | ICD-10-CM

## 2014-12-10 DIAGNOSIS — K900 Celiac disease: Secondary | ICD-10-CM

## 2014-12-10 DIAGNOSIS — Z5189 Encounter for other specified aftercare: Secondary | ICD-10-CM | POA: Diagnosis not present

## 2014-12-10 MED ORDER — PEGFILGRASTIM INJECTION 6 MG/0.6ML ~~LOC~~
6.0000 mg | PREFILLED_SYRINGE | Freq: Once | SUBCUTANEOUS | Status: AC
  Administered 2014-12-10: 6 mg via SUBCUTANEOUS
  Filled 2014-12-10: qty 0.6

## 2014-12-10 MED ORDER — ONDANSETRON 8 MG/NS 50 ML IVPB
INTRAVENOUS | Status: AC
  Filled 2014-12-10: qty 8

## 2014-12-10 MED ORDER — SODIUM CHLORIDE 0.9 % IJ SOLN
10.0000 mL | INTRAMUSCULAR | Status: DC | PRN
  Administered 2014-12-10: 10 mL
  Filled 2014-12-10: qty 10

## 2014-12-10 MED ORDER — HEPARIN SOD (PORK) LOCK FLUSH 100 UNIT/ML IV SOLN
500.0000 [IU] | Freq: Once | INTRAVENOUS | Status: AC | PRN
  Administered 2014-12-10: 500 [IU]
  Filled 2014-12-10: qty 5

## 2014-12-10 MED ORDER — ONDANSETRON 8 MG/50ML IVPB (CHCC)
8.0000 mg | INTRAVENOUS | Status: DC | PRN
  Administered 2014-12-10: 8 mg via INTRAVENOUS

## 2014-12-10 MED ORDER — SODIUM CHLORIDE 0.9 % IV SOLN
Freq: Once | INTRAVENOUS | Status: AC
  Administered 2014-12-10: 10:00:00 via INTRAVENOUS

## 2014-12-10 NOTE — Patient Instructions (Signed)
Dehydration, Adult Dehydration is when you lose more fluids from the body than you take in. Vital organs like the kidneys, brain, and heart cannot function without a proper amount of fluids and salt. Any loss of fluids from the body can cause dehydration.  CAUSES   Vomiting.  Diarrhea.  Excessive sweating.  Excessive urine output.  Fever. SYMPTOMS  Mild dehydration  Thirst.  Dry lips.  Slightly dry mouth. Moderate dehydration  Very dry mouth.  Sunken eyes.  Skin does not bounce back quickly when lightly pinched and released.  Dark urine and decreased urine production.  Decreased tear production.  Headache. Severe dehydration  Very dry mouth.  Extreme thirst.  Rapid, weak pulse (more than 100 beats per minute at rest).  Cold hands and feet.  Not able to sweat in spite of heat and temperature.  Rapid breathing.  Blue lips.  Confusion and lethargy.  Difficulty being awakened.  Minimal urine production.  No tears. DIAGNOSIS  Your caregiver will diagnose dehydration based on your symptoms and your exam. Blood and urine tests will help confirm the diagnosis. The diagnostic evaluation should also identify the cause of dehydration. TREATMENT  Treatment of mild or moderate dehydration can often be done at home by increasing the amount of fluids that you drink. It is best to drink small amounts of fluid more often. Drinking too much at one time can make vomiting worse. Refer to the home care instructions below. Severe dehydration needs to be treated at the hospital where you will probably be given intravenous (IV) fluids that contain water and electrolytes. HOME CARE INSTRUCTIONS   Ask your caregiver about specific rehydration instructions.  Drink enough fluids to keep your urine clear or pale yellow.  Drink small amounts frequently if you have nausea and vomiting.  Eat as you normally do.  Avoid:  Foods or drinks high in sugar.  Carbonated  drinks.  Juice.  Extremely hot or cold fluids.  Drinks with caffeine.  Fatty, greasy foods.  Alcohol.  Tobacco.  Overeating.  Gelatin desserts.  Wash your hands well to avoid spreading bacteria and viruses.  Only take over-the-counter or prescription medicines for pain, discomfort, or fever as directed by your caregiver.  Ask your caregiver if you should continue all prescribed and over-the-counter medicines.  Keep all follow-up appointments with your caregiver. SEEK MEDICAL CARE IF:  You have abdominal pain and it increases or stays in one area (localizes).  You have a rash, stiff neck, or severe headache.  You are irritable, sleepy, or difficult to awaken.  You are weak, dizzy, or extremely thirsty. SEEK IMMEDIATE MEDICAL CARE IF:   You are unable to keep fluids down or you get worse despite treatment.  You have frequent episodes of vomiting or diarrhea.  You have blood or green matter (bile) in your vomit.  You have blood in your stool or your stool looks black and tarry.  You have not urinated in 6 to 8 hours, or you have only urinated a small amount of very dark urine.  You have a fever.  You faint. MAKE SURE YOU:   Understand these instructions.  Will watch your condition.  Will get help right away if you are not doing well or get worse. Document Released: 12/10/2005 Document Revised: 03/03/2012 Document Reviewed: 07/30/2011 Exit[employer] Patient Information 2015 [employer]. This information is not intended to replace advice given to you by your health care provider. Make sure you discuss any questions you have with your health care   provider.  

## 2014-12-11 ENCOUNTER — Ambulatory Visit (HOSPITAL_BASED_OUTPATIENT_CLINIC_OR_DEPARTMENT_OTHER): Payer: BC Managed Care – PPO

## 2014-12-11 DIAGNOSIS — C50511 Malignant neoplasm of lower-outer quadrant of right female breast: Secondary | ICD-10-CM | POA: Diagnosis not present

## 2014-12-11 MED ORDER — SODIUM CHLORIDE 0.9 % IV SOLN
Freq: Once | INTRAVENOUS | Status: AC
  Administered 2014-12-11: 09:00:00 via INTRAVENOUS

## 2014-12-11 MED ORDER — HEPARIN SOD (PORK) LOCK FLUSH 100 UNIT/ML IV SOLN
500.0000 [IU] | Freq: Once | INTRAVENOUS | Status: AC | PRN
  Administered 2014-12-11: 500 [IU]
  Filled 2014-12-11: qty 5

## 2014-12-11 MED ORDER — SODIUM CHLORIDE 0.9 % IJ SOLN
10.0000 mL | INTRAMUSCULAR | Status: DC | PRN
  Administered 2014-12-11: 10 mL
  Filled 2014-12-11: qty 10

## 2014-12-11 NOTE — Patient Instructions (Signed)
Dehydration, Adult Dehydration is when you lose more fluids from the body than you take in. Vital organs like the kidneys, brain, and heart cannot function without a proper amount of fluids and salt. Any loss of fluids from the body can cause dehydration.  CAUSES   Vomiting.  Diarrhea.  Excessive sweating.  Excessive urine output.  Fever. SYMPTOMS  Mild dehydration  Thirst.  Dry lips.  Slightly dry mouth. Moderate dehydration  Very dry mouth.  Sunken eyes.  Skin does not bounce back quickly when lightly pinched and released.  Dark urine and decreased urine production.  Decreased tear production.  Headache. Severe dehydration  Very dry mouth.  Extreme thirst.  Rapid, weak pulse (more than 100 beats per minute at rest).  Cold hands and feet.  Not able to sweat in spite of heat and temperature.  Rapid breathing.  Blue lips.  Confusion and lethargy.  Difficulty being awakened.  Minimal urine production.  No tears. DIAGNOSIS  Your caregiver will diagnose dehydration based on your symptoms and your exam. Blood and urine tests will help confirm the diagnosis. The diagnostic evaluation should also identify the cause of dehydration. TREATMENT  Treatment of mild or moderate dehydration can often be done at home by increasing the amount of fluids that you drink. It is best to drink small amounts of fluid more often. Drinking too much at one time can make vomiting worse. Refer to the home care instructions below. Severe dehydration needs to be treated at the hospital where you will probably be given intravenous (IV) fluids that contain water and electrolytes. HOME CARE INSTRUCTIONS   Ask your caregiver about specific rehydration instructions.  Drink enough fluids to keep your urine clear or pale yellow.  Drink small amounts frequently if you have nausea and vomiting.  Eat as you normally do.  Avoid:  Foods or drinks high in sugar.  Carbonated  drinks.  Juice.  Extremely hot or cold fluids.  Drinks with caffeine.  Fatty, greasy foods.  Alcohol.  Tobacco.  Overeating.  Gelatin desserts.  Wash your hands well to avoid spreading bacteria and viruses.  Only take over-the-counter or prescription medicines for pain, discomfort, or fever as directed by your caregiver.  Ask your caregiver if you should continue all prescribed and over-the-counter medicines.  Keep all follow-up appointments with your caregiver. SEEK MEDICAL CARE IF:  You have abdominal pain and it increases or stays in one area (localizes).  You have a rash, stiff neck, or severe headache.  You are irritable, sleepy, or difficult to awaken.  You are weak, dizzy, or extremely thirsty. SEEK IMMEDIATE MEDICAL CARE IF:   You are unable to keep fluids down or you get worse despite treatment.  You have frequent episodes of vomiting or diarrhea.  You have blood or green matter (bile) in your vomit.  You have blood in your stool or your stool looks black and tarry.  You have not urinated in 6 to 8 hours, or you have only urinated a small amount of very dark urine.  You have a fever.  You faint. MAKE SURE YOU:   Understand these instructions.  Will watch your condition.  Will get help right away if you are not doing well or get worse. Document Released: 12/10/2005 Document Revised: 03/03/2012 Document Reviewed: 07/30/2011 Exit[employer] Patient Information 2015 [employer]. This information is not intended to replace advice given to you by your health care provider. Make sure you discuss any questions you have with your health care   provider.  

## 2014-12-13 ENCOUNTER — Ambulatory Visit (HOSPITAL_BASED_OUTPATIENT_CLINIC_OR_DEPARTMENT_OTHER): Payer: BC Managed Care – PPO

## 2014-12-13 DIAGNOSIS — C50511 Malignant neoplasm of lower-outer quadrant of right female breast: Secondary | ICD-10-CM | POA: Diagnosis not present

## 2014-12-13 MED ORDER — SODIUM CHLORIDE 0.9 % IJ SOLN
10.0000 mL | INTRAMUSCULAR | Status: DC | PRN
  Administered 2014-12-13: 10 mL
  Filled 2014-12-13: qty 10

## 2014-12-13 MED ORDER — HEPARIN SOD (PORK) LOCK FLUSH 100 UNIT/ML IV SOLN
500.0000 [IU] | Freq: Once | INTRAVENOUS | Status: AC | PRN
  Administered 2014-12-13: 500 [IU]
  Filled 2014-12-13: qty 5

## 2014-12-13 MED ORDER — SODIUM CHLORIDE 0.9 % IV SOLN
Freq: Once | INTRAVENOUS | Status: AC
  Administered 2014-12-13: 15:00:00 via INTRAVENOUS

## 2014-12-13 NOTE — Patient Instructions (Signed)

## 2014-12-14 ENCOUNTER — Ambulatory Visit (HOSPITAL_BASED_OUTPATIENT_CLINIC_OR_DEPARTMENT_OTHER): Payer: BC Managed Care – PPO

## 2014-12-14 DIAGNOSIS — C50511 Malignant neoplasm of lower-outer quadrant of right female breast: Secondary | ICD-10-CM | POA: Diagnosis not present

## 2014-12-14 MED ORDER — HEPARIN SOD (PORK) LOCK FLUSH 100 UNIT/ML IV SOLN
500.0000 [IU] | Freq: Once | INTRAVENOUS | Status: AC | PRN
  Administered 2014-12-14: 500 [IU]
  Filled 2014-12-14: qty 5

## 2014-12-14 MED ORDER — ONDANSETRON 8 MG/50ML IVPB (CHCC): 8.0000 mg | INTRAVENOUS | Status: DC | PRN

## 2014-12-14 MED ORDER — SODIUM CHLORIDE 0.9 % IV SOLN
Freq: Once | INTRAVENOUS | Status: AC
  Administered 2014-12-14: 12:00:00 via INTRAVENOUS

## 2014-12-14 MED ORDER — HEPARIN SOD (PORK) LOCK FLUSH 100 UNIT/ML IV SOLN
250.0000 [IU] | Freq: Once | INTRAVENOUS | Status: DC | PRN
  Filled 2014-12-14: qty 5

## 2014-12-14 MED ORDER — ALTEPLASE 2 MG IJ SOLR
2.0000 mg | Freq: Once | INTRAMUSCULAR | Status: DC | PRN
  Filled 2014-12-14: qty 2

## 2014-12-14 MED ORDER — SODIUM CHLORIDE 0.9 % IJ SOLN
10.0000 mL | Freq: Once | INTRAMUSCULAR | Status: AC
  Administered 2014-12-14: 10 mL via INTRAVENOUS
  Filled 2014-12-14: qty 10

## 2014-12-14 NOTE — Patient Instructions (Signed)
Dehydration, Adult Dehydration is when you lose more fluids from the body than you take in. Vital organs like the kidneys, brain, and heart cannot function without a proper amount of fluids and salt. Any loss of fluids from the body can cause dehydration.  CAUSES   Vomiting.  Diarrhea.  Excessive sweating.  Excessive urine output.  Fever. SYMPTOMS  Mild dehydration  Thirst.  Dry lips.  Slightly dry mouth. Moderate dehydration  Very dry mouth.  Sunken eyes.  Skin does not bounce back quickly when lightly pinched and released.  Dark urine and decreased urine production.  Decreased tear production.  Headache. Severe dehydration  Very dry mouth.  Extreme thirst.  Rapid, weak pulse (more than 100 beats per minute at rest).  Cold hands and feet.  Not able to sweat in spite of heat and temperature.  Rapid breathing.  Blue lips.  Confusion and lethargy.  Difficulty being awakened.  Minimal urine production.  No tears. DIAGNOSIS  Your caregiver will diagnose dehydration based on your symptoms and your exam. Blood and urine tests will help confirm the diagnosis. The diagnostic evaluation should also identify the cause of dehydration. TREATMENT  Treatment of mild or moderate dehydration can often be done at home by increasing the amount of fluids that you drink. It is best to drink small amounts of fluid more often. Drinking too much at one time can make vomiting worse. Refer to the home care instructions below. Severe dehydration needs to be treated at the hospital where you will probably be given intravenous (IV) fluids that contain water and electrolytes. HOME CARE INSTRUCTIONS   Ask your caregiver about specific rehydration instructions.  Drink enough fluids to keep your urine clear or pale yellow.  Drink small amounts frequently if you have nausea and vomiting.  Eat as you normally do.  Avoid:  Foods or drinks high in sugar.  Carbonated  drinks.  Juice.  Extremely hot or cold fluids.  Drinks with caffeine.  Fatty, greasy foods.  Alcohol.  Tobacco.  Overeating.  Gelatin desserts.  Wash your hands well to avoid spreading bacteria and viruses.  Only take over-the-counter or prescription medicines for pain, discomfort, or fever as directed by your caregiver.  Ask your caregiver if you should continue all prescribed and over-the-counter medicines.  Keep all follow-up appointments with your caregiver. SEEK MEDICAL CARE IF:  You have abdominal pain and it increases or stays in one area (localizes).  You have a rash, stiff neck, or severe headache.  You are irritable, sleepy, or difficult to awaken.  You are weak, dizzy, or extremely thirsty. SEEK IMMEDIATE MEDICAL CARE IF:   You are unable to keep fluids down or you get worse despite treatment.  You have frequent episodes of vomiting or diarrhea.  You have blood or green matter (bile) in your vomit.  You have blood in your stool or your stool looks black and tarry.  You have not urinated in 6 to 8 hours, or you have only urinated a small amount of very dark urine.  You have a fever.  You faint. MAKE SURE YOU:   Understand these instructions.  Will watch your condition.  Will get help right away if you are not doing well or get worse. Document Released: 12/10/2005 Document Revised: 03/03/2012 Document Reviewed: 07/30/2011 Exit[employer] Patient Information 2015 [employer]. This information is not intended to replace advice given to you by your health care provider. Make sure you discuss any questions you have with your health care   provider.  

## 2014-12-15 ENCOUNTER — Encounter: Payer: Self-pay | Admitting: Nurse Practitioner

## 2014-12-15 ENCOUNTER — Telehealth: Payer: Self-pay | Admitting: Nurse Practitioner

## 2014-12-15 ENCOUNTER — Ambulatory Visit (HOSPITAL_BASED_OUTPATIENT_CLINIC_OR_DEPARTMENT_OTHER): Payer: BC Managed Care – PPO | Admitting: Lab

## 2014-12-15 ENCOUNTER — Ambulatory Visit (HOSPITAL_BASED_OUTPATIENT_CLINIC_OR_DEPARTMENT_OTHER): Payer: BC Managed Care – PPO | Admitting: Nurse Practitioner

## 2014-12-15 ENCOUNTER — Telehealth: Payer: Self-pay | Admitting: *Deleted

## 2014-12-15 VITALS — BP 91/53 | HR 85 | Temp 98.6°F | Resp 18 | Ht 61.0 in | Wt 117.6 lb

## 2014-12-15 DIAGNOSIS — E860 Dehydration: Secondary | ICD-10-CM

## 2014-12-15 DIAGNOSIS — Z170 Estrogen receptor positive status [ER+]: Secondary | ICD-10-CM | POA: Diagnosis not present

## 2014-12-15 DIAGNOSIS — R630 Anorexia: Secondary | ICD-10-CM

## 2014-12-15 DIAGNOSIS — C50511 Malignant neoplasm of lower-outer quadrant of right female breast: Secondary | ICD-10-CM

## 2014-12-15 DIAGNOSIS — K5900 Constipation, unspecified: Secondary | ICD-10-CM

## 2014-12-15 DIAGNOSIS — Z803 Family history of malignant neoplasm of breast: Secondary | ICD-10-CM

## 2014-12-15 DIAGNOSIS — K900 Celiac disease: Secondary | ICD-10-CM

## 2014-12-15 LAB — COMPREHENSIVE METABOLIC PANEL (CC13)
ALBUMIN: 3.5 g/dL (ref 3.5–5.0)
ALT: 12 U/L (ref 0–55)
ANION GAP: 8 meq/L (ref 3–11)
AST: 11 U/L (ref 5–34)
Alkaline Phosphatase: 114 U/L (ref 40–150)
BILIRUBIN TOTAL: 0.44 mg/dL (ref 0.20–1.20)
BUN: 11.9 mg/dL (ref 7.0–26.0)
CALCIUM: 9 mg/dL (ref 8.4–10.4)
CHLORIDE: 103 meq/L (ref 98–109)
CO2: 28 meq/L (ref 22–29)
Creatinine: 0.6 mg/dL (ref 0.6–1.1)
EGFR: >90 mL/min/{1.73_m2} (ref >90)
Glucose: 67 mg/dl — ABNORMAL LOW (ref 70–140)
Potassium: 4.6 mEq/L (ref 3.5–5.1)
Sodium: 140 mEq/L (ref 136–145)
TOTAL PROTEIN: 6.1 g/dL — AB (ref 6.4–8.3)

## 2014-12-15 LAB — CBC WITH DIFFERENTIAL/PLATELET
BASO%: 1.5 % (ref 0.0–2.0)
Basophils Absolute: 0 10*3/uL (ref 0.0–0.1)
EOS ABS: 0.1 10*3/uL (ref 0.0–0.5)
EOS%: 3.1 % (ref 0.0–7.0)
HEMATOCRIT: 31.9 % — AB (ref 34.8–46.6)
HEMOGLOBIN: 10.9 g/dL — AB (ref 11.6–15.9)
LYMPH#: 0.2 10*3/uL — AB (ref 0.9–3.3)
LYMPH%: 12.2 % — ABNORMAL LOW (ref 14.0–49.7)
MCH: 31.2 pg (ref 25.1–34.0)
MCHC: 34.2 g/dL (ref 31.5–36.0)
MCV: 91.4 fL (ref 79.5–101.0)
MONO#: 0.1 10*3/uL (ref 0.1–0.9)
MONO%: 5.6 % (ref 0.0–14.0)
NEUT#: 1.5 10*3/uL (ref 1.5–6.5)
NEUT%: 77.6 % — AB (ref 38.4–76.8)
PLATELETS: 100 10*3/uL — AB (ref 145–400)
RBC: 3.49 10*6/uL — ABNORMAL LOW (ref 3.70–5.45)
RDW: 14.6 % — ABNORMAL HIGH (ref 11.2–14.5)
WBC: 2 10*3/uL — ABNORMAL LOW (ref 3.9–10.3)

## 2014-12-15 NOTE — Telephone Encounter (Signed)
Per staff message and POF I have scheduled appts. Advised scheduler of appts. JMW  

## 2014-12-15 NOTE — Progress Notes (Signed)
[place]  [phone]) [phone] Fax:([phone]     ID: Misty [name] DOB: 1976-06-27  MR#: [id number]  BWL#:[id number]  Patient Care Team: Glenda Chroman, MD as PCP - General (Internal Medicine) Fanny Skates, MD as Consulting Physician (General Surgery) Chauncey Cruel, MD as Consulting Physician (Oncology) PCP: Glenda Chroman., MD Santo Held, MD (GYN) Crissie Reese M.D. (Plastics)  CHIEF COMPLAINT: Estrogen receptor positive breast cancer CURRENT TREATMENT: adjuvant chemotherapy    BREAST CANCER HISTORY: From the original intake note:  Misty [name] was closely followed for some right breast changes between 20 11/12/2012. Everything seemed stable at that time. In 2014 she was promoted in her job, was working very hard, and actually missed having a mammogram. Late in January 2015 she noted some pain in her right breast and by February when she lifted her arms she noted that the nipple was tilting sideways and seemed a little bit of gathered. She didn't think much of this however. It was not until July that she noted a palpable mass in the lateral right breast. She brought this to her gynecologist's attention and she was set up for diagnostic mammography at [facility] 08/04/2014.   There was an area of distortion in the lateral aspect of the right breast noted on mammography,  corresponding to a palpable firm mass at the 8:00 position of the right breast. Ultrasound confirmed a 2.4 cm irregular hypoechoic spiculated mass in the right breast at the 8:00 position and in addition at the 5:00 position 1 cm from the nipple there was a taller than wide irregular hypoechoic mass measuring 0.8 cm. There was no right axillary adenopathy. In the left breast upper-outer quadrant there was a minimally complicated cyst measuring 0.7 cm.  On 08/11/2014 the patient underwent biopsy of the 2 right breast masses noted on ultrasound. Both showed invasive ductal carcinoma, grade 1,  estrogen receptor 90% positive, progesterone receptor 90% positive, both with strong staining intensity, HER-2 equivocal at 2+ but negative by FISH, with the signals ratio being 1.20 and the number per nucleus 2.1. The proliferation fraction by MIB-1 was 17% (SG [id number]; this report was reviewed by our pathologist, who concurred, [name] [phone]).  On 08/25/2014 the patient underwent bilateral breast MRI at [facility]. This showed, in the right breast, an irregular enhancing mass at the 8:00 position measuring 2.7 cm. Also in the right breast at the 6:00 position there was another enhancing mass measuring 0.6 cm, located 2 cm away from the larger mass. There were also masses in the retroareolar position measuring 0.7 cm, in the posterior central right breast measuring 0.5 cm, and another one just inferior to the midline measuring 5.5 cm. There were no masses or findings of concern in the left breast and no abnormal appearing lymph nodes.  The patient's subsequent history is as detailed below  INTERVAL HISTORY: Misty [name] returns today for follow up of her breast cancer, accompanied by her mother. Today is day 8, cycle 3 of adjuvant doxorubicin and cyclophosphamide given every 2 weeks with Neulasta on day 2. Misty [name] felt cold, clammy, and lightheaded this morning. She had a blueberry muffin and some hash browns. Her blood sugar was 67 when her labs were checked this morning. She was given a soda before our visit started.   REVIEW OF SYSTEMS: Misty [name] denies fevers or chills. Her nausea is minimal and well managed with PRN antiemetics. She continues to struggle with her bowel habits. She is taking 1 colace BID but is still producing  hard stools. She has not investigated whether or not miralax is ok use with her gluten allergy. This is also partially due to her dehydration. She received 1L NS daily x 4 and her pressure is still in the 90s-low100s/40s-50s. She has not been tachycardic so far. She is barely eating  or drinking on the weeks of chemo, though she is not nauseated or overly fatigued. Misty [name] denies shortness of breath, chest pain, cough, or palpitations. A detailed review of systems is otherwise negative.    PAST MEDICAL HISTORY: Past Medical History  Diagnosis Date  . Thyroid disease   . Celiac disease   . PONV (postoperative nausea and vomiting)   . Hypothyroidism   . Anxiety   . Headache(784.0)   . Cancer     breast cancer    PAST SURGICAL HISTORY: Past Surgical History  Procedure Laterality Date  . Cesarean section    . Gallbladder surgery    . Cholecystectomy    . Simple mastectomy with axillary sentinel node biopsy Right 09/28/2014    Procedure: RIGHT TOTAL MASTECTOMY, RIGHT  AXILLARY SENTINEL NODE BIOPSY;  Surgeon: Fanny Skates, MD;  Location: [place] [facility];  Service: General;  Laterality: Right;  . Portacath placement Right 10/22/2014    Procedure: INSERTION PORT-A-CATH;  Surgeon: Fanny Skates, MD;  Location: [place];  Service: General;  Laterality: Right;    FAMILY HISTORY Family History  Problem Relation Age of Onset  . Skin cancer Father   . Breast cancer Maternal Grandmother     dx unknown age  . Breast cancer Other 43    mat great aunt through [facility] with breast cancer   the patient's parents are both living, in their late 24s. The patient had one brother, no sisters. The patient's maternal grandmother was diagnosed with breast cancer in her late 26s. One of that grandmother's sisters was also diagnosed with breast cancer, in her 71s. There is no other history of breast or ovarian cancer in the family  GYNECOLOGIC HISTORY:  No LMP recorded. Menarche age 37, first live birth age 4, the patient is GX P1. She is still having regular periods. 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 Her husband, Misty [place]  "[name]" Lacey Jensen [name], works as a Engineer, structural in [place]. Their son Misty [name] is 84. The patient attends a local [place]: Not in place   HEALTH MAINTENANCE: History  Substance Use Topics  . Smoking status: Never Smoker   . Smokeless tobacco: Not on file  . Alcohol Use: No     Colonoscopy:  PAP: 07/29/2014  Bone density:  Lipid panel:  Allergies  Allergen Reactions  . Gluten Meal   . Penicillins Nausea And Vomiting    Current Outpatient Prescriptions  Medication Sig Dispense Refill  . dexamethasone (DECADRON) 4 MG tablet Take 2 tablets by mouth once a day on the day after chemotherapy and then take 2 tablets two times a day for 2 days. Take with food. 30 tablet 1  . lidocaine-prilocaine (EMLA) cream Apply 1 application topically as needed. Apply over port area 1-2 hours before chemotherapy, cover with plastic wrap 30 g 0  . LORazepam (ATIVAN) 0.5 MG tablet Take at bedtime as directed 30 tablet 0  . ondansetron (ZOFRAN) 8 MG tablet Take 1 tablet (8 mg total) by mouth 2 (two) times daily. [place]  tablet 1  . SYNTHROID 100 MCG tablet     . HYDROcodone-acetaminophen (NORCO) 5-325 MG per tablet Take 1-2 tablets by mouth every 6 (six) hours as needed for moderate pain or severe pain. (Patient not taking: Reported on 12/08/2014) 30 tablet 0  . prochlorperazine (COMPAZINE) 10 MG tablet Take every 6 hours by mouth as directed (Patient not taking: Reported on 12/08/2014) 30 tablet 1   No current facility-administered medications for this visit.    OBJECTIVE: Middle-aged white woman who appears stated age 38 Vitals:   12/15/14 1112  BP: 91/53  Pulse: 85  Temp: 98.6 F (37 C)  Resp: 18     Body mass index is 22.23 kg/(m^2).    ECOG FS:1 - Symptomatic but completely ambulatory  Skin: warm, dry  HEENT: sclerae anicteric, conjunctivae pink, oropharynx clear. No thrush or mucositis.  Lymph Nodes: No cervical or supraclavicular lymphadenopathy  Lungs:  clear to auscultation bilaterally, no rales, wheezes, or rhonci  Heart: regular rate and rhythm  Abdomen: round, soft, non tender, positive bowel sounds  Musculoskeletal: No focal spinal tenderness, no peripheral edema  Neuro: non focal, well oriented, positive affect  Breasts: deferred   LAB RESULTS:  CMP     Component Value Date/Time   NA 140 12/15/2014 1043   NA 142 09/23/2014 1456   K 4.6 12/15/2014 1043   K 3.9 09/23/2014 1456   CL 104 09/23/2014 1456   CO2 28 12/15/2014 1043   CO2 27 09/23/2014 1456   GLUCOSE 67* 12/15/2014 1043   GLUCOSE 71 09/23/2014 1456   BUN 11.9 12/15/2014 1043   BUN 11 09/23/2014 1456   CREATININE 0.6 12/15/2014 1043   CREATININE 0.70 09/23/2014 1456   CALCIUM 9.0 12/15/2014 1043   CALCIUM 9.8 09/23/2014 1456   PROT 6.1* 12/15/2014 1043   PROT 7.7 09/23/2014 1456   ALBUMIN 3.5 12/15/2014 1043   ALBUMIN 4.0 09/23/2014 1456   AST 11 12/15/2014 1043   AST 19 09/23/2014 1456   ALT 12 12/15/2014 1043   ALT 12 09/23/2014 1456   ALKPHOS 114 12/15/2014 1043   ALKPHOS 60 09/23/2014 1456   BILITOT 0.44 12/15/2014 1043   BILITOT 0.5 09/23/2014 1456   GFRNONAA >90 09/23/2014 1456   GFRAA >90 09/23/2014 1456    I No results found for: SPEP  Lab Results  Component Value Date   WBC 2.0* 12/15/2014   NEUTROABS 1.5 12/15/2014   HGB 10.9* 12/15/2014   HCT 31.9* 12/15/2014   MCV 91.4 12/15/2014   PLT 100* 12/15/2014      Chemistry      Component Value Date/Time   NA 140 12/15/2014 1043   NA 142 09/23/2014 1456   K 4.6 12/15/2014 1043   K 3.9 09/23/2014 1456   CL 104 09/23/2014 1456   CO2 28 12/15/2014 1043   CO2 27 09/23/2014 1456   BUN 11.9 12/15/2014 1043   BUN 11 09/23/2014 1456   CREATININE 0.6 12/15/2014 1043   CREATININE 0.70 09/23/2014 1456      Component Value Date/Time   CALCIUM 9.0 12/15/2014 1043   CALCIUM 9.8 09/23/2014 1456   ALKPHOS 114 12/15/2014 1043   ALKPHOS 60 09/23/2014 1456   AST 11 12/15/2014 1043   AST 19  09/23/2014 1456   ALT 12 12/15/2014 1043   ALT 12 09/23/2014 1456   BILITOT 0.44 12/15/2014 1043   BILITOT 0.5 09/23/2014 1456       No results found for: LABCA2  No components found for: [id number]  No results for input(s): INR in the last 168 hours.  Urinalysis No results found for: COLORURINE  STUDIES: Most recent echocardiogram on 10/27/14 showed an ejection fraction of 50-55%  No results found.  ASSESSMENT: 38 y.o. [place], [place] woman status post biopsy of 2 separate right breast masses 08/11/2014, both positive for an invasive ductal carcinoma, grade 1, estrogen and progesterone receptor strongly positive, with an MIB-1 of 19%, and HER-2 nonamplified  (1) genetics testing sent 08/26/2014, showing no mutations in the BRCA genes; there were also no mutations in a TM, BARD 1, BRIP1, CDH 1, CHE K2, MR E11A, M UT YH, N BN, NF1, PA L B2, PTE N, RAD 50, RAD 50 1C, RAD 50 1D, and T p53.  (2) status post right mastectomy with sentinel lymph node sampling 09/28/2014 for an mpT2 pN1, stage IIB invasive breast cancer (with both ductal and lobular features), grade 2, with negative margins  (3) adjuvant chemotherapy to consist of doxorubicin and cyclophosphamide and dose dense fashion 4 with Neulasta support, beginning 11/11/2014, to be followed by paclitaxel  (4) radiation to follow chemotherapy.  (5) anti-estrogens to follow radiation  (6) history of celiac disease  PLAN: The labs were reviewed in detail, and besides the low blood sugar, they were relatively stable. We spent about 25 minutes discussing her future plans, and she requested that her 4th and final cycle of doxorubicin and cyclophosphamide be moved back 1 week to give her body some more time to recover over the holidays. She will make more of an effort to eat regularly and stay hydrated. She will call the [facility] if she feels she is in need of more IV fluids. She tends to do better the further she gets away from the  chemo day. I have recommended she double her colace dose BID and look into beginning miralax daily.  Misty [name] will return next week for a simple follow up visit and resume AC in the new year. She will discuss with Dr. Jana Hakim in early January regarding the scheduling of her paclitaxel. She understands and agrees with this plan. She knows the goal of treatment in her case is cure. She has been encouraged to call with any issues that might arise before her next visit here.   Marcelino Duster, NP   12/15/2014 11:58 AM

## 2014-12-15 NOTE — Telephone Encounter (Signed)
per pof to sch pt appt-sent LD email to pre-cert [facility] reply I will sch and contact pt

## 2014-12-20 ENCOUNTER — Other Ambulatory Visit: Payer: Self-pay | Admitting: *Deleted

## 2014-12-20 MED ORDER — LEVOTHYROXINE SODIUM 100 MCG PO TABS: 100.0000 ug | ORAL_TABLET | Freq: Every day | ORAL | Status: DC

## 2014-12-23 ENCOUNTER — Ambulatory Visit: Payer: BC Managed Care – PPO

## 2014-12-23 ENCOUNTER — Ambulatory Visit: Payer: BC Managed Care – PPO | Admitting: Nurse Practitioner

## 2014-12-23 ENCOUNTER — Other Ambulatory Visit: Payer: BC Managed Care – PPO

## 2014-12-23 ENCOUNTER — Other Ambulatory Visit (HOSPITAL_BASED_OUTPATIENT_CLINIC_OR_DEPARTMENT_OTHER): Payer: BC Managed Care – PPO

## 2014-12-23 ENCOUNTER — Encounter: Payer: Self-pay | Admitting: Nurse Practitioner

## 2014-12-23 ENCOUNTER — Ambulatory Visit (HOSPITAL_BASED_OUTPATIENT_CLINIC_OR_DEPARTMENT_OTHER): Payer: BC Managed Care – PPO | Admitting: Nurse Practitioner

## 2014-12-23 VITALS — BP 104/55 | HR 86 | Temp 98.4°F | Resp 18 | Ht 61.0 in | Wt 125.7 lb

## 2014-12-23 DIAGNOSIS — C50511 Malignant neoplasm of lower-outer quadrant of right female breast: Secondary | ICD-10-CM

## 2014-12-23 DIAGNOSIS — Z803 Family history of malignant neoplasm of breast: Secondary | ICD-10-CM

## 2014-12-23 DIAGNOSIS — E039 Hypothyroidism, unspecified: Secondary | ICD-10-CM

## 2014-12-23 DIAGNOSIS — C50311 Malignant neoplasm of lower-inner quadrant of right female breast: Secondary | ICD-10-CM

## 2014-12-23 DIAGNOSIS — K900 Celiac disease: Secondary | ICD-10-CM

## 2014-12-23 LAB — CBC WITH DIFFERENTIAL/PLATELET
BASO%: 1 % (ref 0.0–2.0)
Basophils Absolute: 0.1 10*3/uL (ref 0.0–0.1)
EOS%: 0.3 % (ref 0.0–7.0)
Eosinophils Absolute: 0 10*3/uL (ref 0.0–0.5)
HEMATOCRIT: 30.4 % — AB (ref 34.8–46.6)
HGB: 10 g/dL — ABNORMAL LOW (ref 11.6–15.9)
LYMPH%: 4.9 % — AB (ref 14.0–49.7)
MCH: 31.3 pg (ref 25.1–34.0)
MCHC: 33.1 g/dL (ref 31.5–36.0)
MCV: 94.6 fL (ref 79.5–101.0)
MONO#: 0.7 10*3/uL (ref 0.1–0.9)
MONO%: 4.8 % (ref 0.0–14.0)
NEUT#: 13.1 10*3/uL — ABNORMAL HIGH (ref 1.5–6.5)
NEUT%: 89 % — ABNORMAL HIGH (ref 38.4–76.8)
PLATELETS: 152 10*3/uL (ref 145–400)
RBC: 3.21 10*6/uL — ABNORMAL LOW (ref 3.70–5.45)
RDW: 15.4 % — ABNORMAL HIGH (ref 11.2–14.5)
WBC: 14.8 10*3/uL — AB (ref 3.9–10.3)
lymph#: 0.7 10*3/uL — ABNORMAL LOW (ref 0.9–3.3)

## 2014-12-23 LAB — COMPREHENSIVE METABOLIC PANEL (CC13)
ALBUMIN: 3.6 g/dL (ref 3.5–5.0)
ALT: 16 U/L (ref 0–55)
ANION GAP: 6 meq/L (ref 3–11)
AST: 17 U/L (ref 5–34)
Alkaline Phosphatase: 100 U/L (ref 40–150)
BILIRUBIN TOTAL: 0.28 mg/dL (ref 0.20–1.20)
BUN: 9.3 mg/dL (ref 7.0–26.0)
CO2: 30 mEq/L — ABNORMAL HIGH (ref 22–29)
Calcium: 9.4 mg/dL (ref 8.4–10.4)
Chloride: 106 mEq/L (ref 98–109)
Creatinine: 0.7 mg/dL (ref 0.6–1.1)
EGFR: >90 mL/min/{1.73_m2} (ref >90)
Glucose: 99 mg/dl (ref 70–140)
POTASSIUM: 4.2 meq/L (ref 3.5–5.1)
SODIUM: 142 meq/L (ref 136–145)
Total Protein: 6 g/dL — ABNORMAL LOW (ref 6.4–8.3)

## 2014-12-23 NOTE — Progress Notes (Signed)
[place]  Telephone:(336) [phone] Fax:(336) [phone]     ID: Misty [name] DOB: [birth year]  MR#: [id number]  GLO#:[id number]  Patient Care Team: Misty Chroman, [name] as PCP - General (Internal Medicine) Misty [name] as Consulting Physician (General Surgery) Misty Cruel, [name] as Consulting Physician (Oncology) PCP: Misty [name]., [name] Misty Held, [name] (GYN) Misty [name] M.D. (Plastics)  CHIEF COMPLAINT: Estrogen receptor positive breast cancer CURRENT TREATMENT: adjuvant chemotherapy    BREAST CANCER HISTORY: From the original intake note:  Misty [name] was closely followed for some right breast changes between 20 11/12/2012. Everything seemed stable at that time. In 2014 she was promoted in her job, was working very hard, and actually missed having a mammogram. Late in January 2015 she noted some pain in her right breast and by February when she lifted her arms she noted that the nipple was tilting sideways and seemed a little bit of gathered. She didn't think much of this however. It was not until July that she noted a palpable mass in the lateral right breast. She brought this to her gynecologist's attention and she was set up for diagnostic mammography at [facility] 08/04/2014.   There was an area of distortion in the lateral aspect of the right breast noted on mammography,  corresponding to a palpable firm mass at the 8:00 position of the right breast. Ultrasound confirmed a 2.4 cm irregular hypoechoic spiculated mass in the right breast at the 8:00 position and in addition at the 5:00 position 1 cm from the nipple there was a taller than wide irregular hypoechoic mass measuring 0.8 cm. There was no right axillary adenopathy. In the left breast upper-outer quadrant there was a minimally complicated cyst measuring 0.7 cm.  On 08/11/2014 the patient underwent biopsy of the 2 right breast masses noted on ultrasound. Both showed invasive ductal carcinoma, grade 1,  estrogen receptor 90% positive, progesterone receptor 90% positive, both with strong staining intensity, HER-2 equivocal at 2+ but negative by FISH, with the signals ratio being 1.20 and the number per nucleus 2.1. The proliferation fraction by MIB-1 was 17% ([id number]; this report was reviewed by our pathologist, who concurred, Misty [name]).  On 08/25/2014 the patient underwent bilateral breast MRI at [facility]. This showed, in the right breast, an irregular enhancing mass at the 8:00 position measuring 2.7 cm. Also in the right breast at the 6:00 position there was another enhancing mass measuring 0.6 cm, located 2 cm away from the larger mass. There were also masses in the retroareolar position measuring 0.7 cm, in the posterior central right breast measuring 0.5 cm, and another one just inferior to the midline measuring 5.5 cm. There were no masses or findings of concern in the left breast and no abnormal appearing lymph nodes.  The patient's subsequent history is as detailed below  INTERVAL HISTORY: Misty [name] today for follow up of her breast cancer, accompanied by her husband. She is on adjuvant doxorubicin and cyclophosphamide given every 2 weeks with Neulasta on day 2. Her 4th and final dose is being delayed until next week because of patient preference. She asked for an additional week to recover.  REVIEW OF SYSTEMS: Misty [name] this week. She has been snacking about every 2 hours and has gained 8lbs. She is pushing fluids as well. She denies nausea, vomiting, or changes in bowel or bladder habits. Her rectal pain has lessened. She denies mouth sore, rashes, or peripheral neuropathy symptoms. Her energy level is  stable. A detailed review of systems is otherwise negative.   PAST MEDICAL HISTORY: Past Medical History  Diagnosis Date  . Thyroid disease   . Celiac disease   . PONV (postoperative nausea and vomiting)   . Hypothyroidism   . Anxiety   .  Headache(784.0)   . Cancer     breast cancer    PAST SURGICAL HISTORY: Past Surgical History  Procedure Laterality Date  . Cesarean section    . Gallbladder surgery    . Cholecystectomy    . Simple mastectomy with axillary sentinel node biopsy Right 09/28/2014    Procedure: RIGHT TOTAL MASTECTOMY, RIGHT  AXILLARY SENTINEL NODE BIOPSY;  Surgeon: Misty [name];  Location: [place];  Service: General;  Laterality: Right;  . Portacath placement Right 10/22/2014    Procedure: INSERTION PORT-A-CATH;  Surgeon: Misty [name];  Location: [facility];  Service: General;  Laterality: Right;    FAMILY HISTORY Family History  Problem Relation Age of Onset  . Skin cancer Father   . Breast cancer Maternal Grandmother     dx unknown age  . Breast cancer Other 86    mat great aunt through [facility] with breast cancer   the patient's parents are both living, in their late 41s. The patient had one brother, no sisters. The patient's maternal grandmother was diagnosed with breast cancer in her late 30s. One of that grandmother's sisters was also diagnosed with breast cancer, in her 9s. There is no other history of breast or ovarian cancer in the family  GYNECOLOGIC HISTORY:  No LMP recorded. Menarche age 29, first live birth age 31, the patient is GX P1. She is still having regular periods. 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 Her husband, Misty [name], works as a Engineer, structural in [place]. Their son Misty [name] is 58. The patient attends a local [place]: Not in place   HEALTH MAINTENANCE: History  Substance Use Topics  . Smoking status: Never Smoker   . Smokeless tobacco: Not on file  . Alcohol Use: No     Colonoscopy:  PAP: 07/29/2014  Bone density:  Lipid panel:  Allergies    Allergen Reactions  . Gluten Meal   . Penicillins Nausea And Vomiting    Current Outpatient Prescriptions  Medication Sig Dispense Refill  . levothyroxine (SYNTHROID) 100 MCG tablet Take 1 tablet (100 mcg total) by mouth daily before breakfast. 30 tablet 6  . dexamethasone (DECADRON) 4 MG tablet Take 2 tablets by mouth once a day on the day after chemotherapy and then take 2 tablets two times a day for 2 days. Take with food. (Patient not taking: Reported on 12/23/2014) 30 tablet 1  . HYDROcodone-acetaminophen (NORCO) 5-325 MG per tablet Take 1-2 tablets by mouth every 6 (six) hours as needed for moderate pain or severe pain. (Patient not taking: Reported on 12/08/2014) 30 tablet 0  . lidocaine-prilocaine (EMLA) cream Apply 1 application topically as needed. Apply over port area 1-2 hours before chemotherapy, cover with plastic wrap (Patient not taking: Reported on 12/23/2014) 30 g 0  . LORazepam (ATIVAN) 0.5 MG tablet Take at bedtime as directed (Patient not taking: Reported on 12/23/2014) 30 tablet 0  . ondansetron (ZOFRAN) 8 MG tablet Take 1 tablet (8 mg total) by mouth 2 (two) times daily. (Patient not taking:  Reported on 12/23/2014) 30 tablet 1  . prochlorperazine (COMPAZINE) 10 MG tablet Take every 6 hours by mouth as directed (Patient not taking: Reported on 12/08/2014) 30 tablet 1   No current facility-administered medications for this visit.    OBJECTIVE: Middle-aged white woman who appears stated age 8 Vitals:   12/23/14 1312  BP: 104/55  Pulse: 86  Temp: 98.4 F (36.9 C)  Resp: 18     Body mass index is 23.76 kg/(m^2).    ECOG FS:1 - Symptomatic but completely ambulatory  Sclerae unicteric, pupils equal and reactive Oropharynx clear and moist-- no thrush No cervical or supraclavicular adenopathy Lungs no rales or rhonchi Heart regular rate and rhythm Abd soft, nontender, positive bowel sounds MSK no focal spinal tenderness, no upper extremity lymphedema Neuro:  nonfocal, well oriented, appropriate affect Breasts: deferred  LAB RESULTS:  CMP     Component Value Date/Time   NA 142 12/23/2014 1228   NA 142 09/23/2014 1456   K 4.2 12/23/2014 1228   K 3.9 09/23/2014 1456   CL 104 09/23/2014 1456   CO2 30* 12/23/2014 1228   CO2 27 09/23/2014 1456   GLUCOSE 99 12/23/2014 1228   GLUCOSE 71 09/23/2014 1456   BUN 9.3 12/23/2014 1228   BUN 11 09/23/2014 1456   CREATININE 0.7 12/23/2014 1228   CREATININE 0.70 09/23/2014 1456   CALCIUM 9.4 12/23/2014 1228   CALCIUM 9.8 09/23/2014 1456   PROT 6.0* 12/23/2014 1228   PROT 7.7 09/23/2014 1456   ALBUMIN 3.6 12/23/2014 1228   ALBUMIN 4.0 09/23/2014 1456   AST 17 12/23/2014 1228   AST 19 09/23/2014 1456   ALT 16 12/23/2014 1228   ALT 12 09/23/2014 1456   ALKPHOS 100 12/23/2014 1228   ALKPHOS 60 09/23/2014 1456   BILITOT 0.28 12/23/2014 1228   BILITOT 0.5 09/23/2014 1456   GFRNONAA >90 09/23/2014 1456   GFRAA >90 09/23/2014 1456    I No results found for: SPEP  Lab Results  Component Value Date   WBC 14.8* 12/23/2014   NEUTROABS 13.1* 12/23/2014   HGB 10.0* 12/23/2014   HCT 30.4* 12/23/2014   MCV 94.6 12/23/2014   PLT 152 12/23/2014      Chemistry      Component Value Date/Time   NA 142 12/23/2014 1228   NA 142 09/23/2014 1456   K 4.2 12/23/2014 1228   K 3.9 09/23/2014 1456   CL 104 09/23/2014 1456   CO2 30* 12/23/2014 1228   CO2 27 09/23/2014 1456   BUN 9.3 12/23/2014 1228   BUN 11 09/23/2014 1456   CREATININE 0.7 12/23/2014 1228   CREATININE 0.70 09/23/2014 1456      Component Value Date/Time   CALCIUM 9.4 12/23/2014 1228   CALCIUM 9.8 09/23/2014 1456   ALKPHOS 100 12/23/2014 1228   ALKPHOS 60 09/23/2014 1456   AST 17 12/23/2014 1228   AST 19 09/23/2014 1456   ALT 16 12/23/2014 1228   ALT 12 09/23/2014 1456   BILITOT 0.28 12/23/2014 1228   BILITOT 0.5 09/23/2014 1456       No results found for: LABCA2  No components found for: LABCA125  No results for  input(s): INR in the last 168 hours.  Urinalysis No results found for: COLORURINE  STUDIES: Most recent echocardiogram on 10/27/14 showed an ejection fraction of 50-55%  No results found.  ASSESSMENT: 38 y.o. [place], [place] woman status post biopsy of 2 separate right breast masses 08/11/2014, both positive for an invasive ductal carcinoma,  grade 1, estrogen and progesterone receptor strongly positive, with an MIB-1 of 19%, and HER-2 nonamplified  (1) genetics testing sent 08/26/2014, showing no mutations in the BRCA genes; there were also no mutations in a TM, BARD 1, BRIP1, CDH 1, CHE K2, MR E11A, M UT YH, N BN, NF1, PA L B2, PTE N, RAD 50, RAD 50 1C, RAD 50 1D, and T p53.  (2) status post right mastectomy with sentinel lymph node sampling 09/28/2014 for an mpT2 pN1, stage IIB invasive breast cancer (with both ductal and lobular features), grade 2, with negative margins  (3) adjuvant chemotherapy to consist of doxorubicin and cyclophosphamide and dose dense fashion 4 with Neulasta support, beginning 11/11/2014, to be followed by paclitaxel  (4) radiation to follow chemotherapy.  (5) anti-estrogens to follow radiation  (6) history of celiac disease  PLAN: Angeletta looks and feels well today. The labs were reviewed in detail and were entirely stable. There would have been no delays with treatment this week, had the patient not chosen to wait until next week for treatment.  Ryelynn will return next week for her 4th and final dose of doxorubicin and cyclophosphamide. She is scheduled for IV fluids daily x 4 for rehydration. She will discuss with Dr. Jana Hakim in early January at her next visit regarding the scheduling of her paclitaxel. She wants to discuss dose dense versus weekly options. She understands and agrees with this plan. She knows the goal of treatment in her case is cure. She has been encouraged to call with any issues that might arise before her next visit here.   Marcelino Duster, NP   12/23/2014 1:47 PM

## 2014-12-25 ENCOUNTER — Ambulatory Visit: Payer: BC Managed Care – PPO

## 2014-12-30 ENCOUNTER — Other Ambulatory Visit (HOSPITAL_BASED_OUTPATIENT_CLINIC_OR_DEPARTMENT_OTHER): Payer: BLUE CROSS/BLUE SHIELD

## 2014-12-30 ENCOUNTER — Ambulatory Visit (HOSPITAL_BASED_OUTPATIENT_CLINIC_OR_DEPARTMENT_OTHER): Payer: BLUE CROSS/BLUE SHIELD

## 2014-12-30 ENCOUNTER — Ambulatory Visit (HOSPITAL_BASED_OUTPATIENT_CLINIC_OR_DEPARTMENT_OTHER): Payer: BLUE CROSS/BLUE SHIELD | Admitting: Oncology

## 2014-12-30 ENCOUNTER — Telehealth: Payer: Self-pay | Admitting: Oncology

## 2014-12-30 VITALS — BP 108/44 | HR 84 | Temp 98.6°F | Resp 18 | Ht 61.0 in | Wt 125.1 lb

## 2014-12-30 DIAGNOSIS — K900 Celiac disease: Secondary | ICD-10-CM

## 2014-12-30 DIAGNOSIS — F418 Other specified anxiety disorders: Secondary | ICD-10-CM

## 2014-12-30 DIAGNOSIS — C50511 Malignant neoplasm of lower-outer quadrant of right female breast: Secondary | ICD-10-CM

## 2014-12-30 DIAGNOSIS — Z803 Family history of malignant neoplasm of breast: Secondary | ICD-10-CM

## 2014-12-30 DIAGNOSIS — E860 Dehydration: Secondary | ICD-10-CM

## 2014-12-30 DIAGNOSIS — Z170 Estrogen receptor positive status [ER+]: Secondary | ICD-10-CM

## 2014-12-30 DIAGNOSIS — C50311 Malignant neoplasm of lower-inner quadrant of right female breast: Secondary | ICD-10-CM

## 2014-12-30 DIAGNOSIS — R630 Anorexia: Secondary | ICD-10-CM

## 2014-12-30 DIAGNOSIS — I951 Orthostatic hypotension: Secondary | ICD-10-CM

## 2014-12-30 DIAGNOSIS — Z5111 Encounter for antineoplastic chemotherapy: Secondary | ICD-10-CM

## 2014-12-30 LAB — COMPREHENSIVE METABOLIC PANEL (CC13)
ALBUMIN: 3.8 g/dL (ref 3.5–5.0)
ALT: 34 U/L (ref 0–55)
AST: 35 U/L — AB (ref 5–34)
Alkaline Phosphatase: 89 U/L (ref 40–150)
Anion Gap: 8 mEq/L (ref 3–11)
BUN: 18.3 mg/dL (ref 7.0–26.0)
CALCIUM: 9.3 mg/dL (ref 8.4–10.4)
CHLORIDE: 105 meq/L (ref 98–109)
CO2: 27 meq/L (ref 22–29)
CREATININE: 0.7 mg/dL (ref 0.6–1.1)
EGFR: >90 mL/min/{1.73_m2} (ref >90)
Glucose: 111 mg/dl (ref 70–140)
POTASSIUM: 4.1 meq/L (ref 3.5–5.1)
SODIUM: 141 meq/L (ref 136–145)
Total Bilirubin: 0.45 mg/dL (ref 0.20–1.20)
Total Protein: 6.2 g/dL — ABNORMAL LOW (ref 6.4–8.3)

## 2014-12-30 LAB — CBC WITH DIFFERENTIAL/PLATELET
BASO%: 0.7 % (ref 0.0–2.0)
Basophils Absolute: 0.1 10*3/uL (ref 0.0–0.1)
EOS%: 1.6 % (ref 0.0–7.0)
Eosinophils Absolute: 0.1 10*3/uL (ref 0.0–0.5)
HCT: 29.5 % — ABNORMAL LOW (ref 34.8–46.6)
HGB: 10.1 g/dL — ABNORMAL LOW (ref 11.6–15.9)
LYMPH%: 9.1 % — ABNORMAL LOW (ref 14.0–49.7)
MCH: 32.1 pg (ref 25.1–34.0)
MCHC: 34.2 g/dL (ref 31.5–36.0)
MCV: 93.7 fL (ref 79.5–101.0)
MONO#: 0.7 10*3/uL (ref 0.1–0.9)
MONO%: 9.4 % (ref 0.0–14.0)
NEUT#: 5.8 10*3/uL (ref 1.5–6.5)
NEUT%: 79.2 % — ABNORMAL HIGH (ref 38.4–76.8)
Platelets: 209 10*3/uL (ref 145–400)
RBC: 3.15 10*6/uL — AB (ref 3.70–5.45)
RDW: 19.2 % — ABNORMAL HIGH (ref 11.2–14.5)
WBC: 7.4 10*3/uL (ref 3.9–10.3)
lymph#: 0.7 10*3/uL — ABNORMAL LOW (ref 0.9–3.3)

## 2014-12-30 MED ORDER — PALONOSETRON HCL INJECTION 0.25 MG/5ML
0.2500 mg | Freq: Once | INTRAVENOUS | Status: AC
  Administered 2014-12-30: 0.25 mg via INTRAVENOUS

## 2014-12-30 MED ORDER — SODIUM CHLORIDE 0.9 % IJ SOLN
10.0000 mL | INTRAMUSCULAR | Status: DC | PRN
  Administered 2014-12-30: 10 mL
  Filled 2014-12-30: qty 10

## 2014-12-30 MED ORDER — ONDANSETRON HCL 8 MG PO TABS: 8.0000 mg | ORAL_TABLET | Freq: Two times a day (BID) | ORAL | Status: DC

## 2014-12-30 MED ORDER — DEXAMETHASONE SODIUM PHOSPHATE 20 MG/5ML IJ SOLN
INTRAMUSCULAR | Status: AC
  Filled 2014-12-30: qty 5

## 2014-12-30 MED ORDER — LIDOCAINE-PRILOCAINE 2.5-2.5 % EX CREA: 1.0000 "application " | TOPICAL_CREAM | CUTANEOUS | Status: DC | PRN

## 2014-12-30 MED ORDER — PROCHLORPERAZINE MALEATE 10 MG PO TABS: 10.0000 mg | ORAL_TABLET | Freq: Four times a day (QID) | ORAL | Status: DC | PRN

## 2014-12-30 MED ORDER — LORAZEPAM 0.5 MG PO TABS: 0.5000 mg | ORAL_TABLET | Freq: Four times a day (QID) | ORAL | Status: DC | PRN

## 2014-12-30 MED ORDER — SODIUM CHLORIDE 0.9 % IV SOLN
600.0000 mg/m2 | Freq: Once | INTRAVENOUS | Status: AC
  Administered 2014-12-30: 920 mg via INTRAVENOUS
  Filled 2014-12-30: qty 46

## 2014-12-30 MED ORDER — DEXAMETHASONE SODIUM PHOSPHATE 20 MG/5ML IJ SOLN
12.0000 mg | Freq: Once | INTRAMUSCULAR | Status: AC
  Administered 2014-12-30: 12 mg via INTRAVENOUS

## 2014-12-30 MED ORDER — DOXORUBICIN HCL CHEMO IV INJECTION 2 MG/ML
60.0000 mg/m2 | Freq: Once | INTRAVENOUS | Status: AC
  Administered 2014-12-30: 92 mg via INTRAVENOUS
  Filled 2014-12-30: qty 46

## 2014-12-30 MED ORDER — PALONOSETRON HCL INJECTION 0.25 MG/5ML
INTRAVENOUS | Status: AC
  Filled 2014-12-30: qty 5

## 2014-12-30 MED ORDER — SODIUM CHLORIDE 0.9 % IV SOLN
Freq: Once | INTRAVENOUS | Status: AC
  Administered 2014-12-30: 17:00:00 via INTRAVENOUS

## 2014-12-30 MED ORDER — HEPARIN SOD (PORK) LOCK FLUSH 100 UNIT/ML IV SOLN
500.0000 [IU] | Freq: Once | INTRAVENOUS | Status: AC | PRN
  Administered 2014-12-30: 500 [IU]
  Filled 2014-12-30: qty 5

## 2014-12-30 MED ORDER — SODIUM CHLORIDE 0.9 % IV SOLN
150.0000 mg | Freq: Once | INTRAVENOUS | Status: AC
  Administered 2014-12-30: 150 mg via INTRAVENOUS
  Filled 2014-12-30: qty 5

## 2014-12-30 NOTE — Progress Notes (Signed)
Patient discharged at [facility] and asked about neulasta injection not being 24 hours tomorrow.  Will need to receive neulasta on 01-01-2015 instead.  Will leave request for schedulers.

## 2014-12-30 NOTE — Progress Notes (Signed)
Discharged to home ambulatory in no distress.

## 2014-12-30 NOTE — Telephone Encounter (Signed)
, °

## 2014-12-30 NOTE — Progress Notes (Signed)
Patient asked for B/p check.  Expressed concern of blood glucose and vital sign changes with treatments.  Denies visual changes, dizziness, lightheaded or feeling faint.  I'm just concerned about the numbers being low.  Denies N/V or diarrhea.

## 2014-12-30 NOTE — Patient Instructions (Signed)
[facility] Discharge Instructions for Patients Receiving Chemotherapy  Today you received the following chemotherapy agents Adriamycin, cytoxan.  To help prevent nausea and vomiting after your treatment, we encourage you to take your nausea medication as ordered by Dr. Jana Hakim   If you develop nausea and vomiting that is not controlled by your nausea medication, call the clinic.   BELOW ARE SYMPTOMS THAT SHOULD BE REPORTED IMMEDIATELY:  *FEVER GREATER THAN 100.5 F  *CHILLS WITH OR WITHOUT FEVER  NAUSEA AND VOMITING THAT IS NOT CONTROLLED WITH YOUR NAUSEA MEDICATION  *UNUSUAL SHORTNESS OF BREATH  *UNUSUAL BRUISING OR BLEEDING  TENDERNESS IN MOUTH AND THROAT WITH OR WITHOUT PRESENCE OF ULCERS  *URINARY PROBLEMS  *BOWEL PROBLEMS  UNUSUAL RASH Items with * indicate a potential emergency and should be followed up as soon as possible.  Feel free to call the clinic you have any questions or concerns. The clinic phone number is [phone].

## 2014-12-30 NOTE — Progress Notes (Signed)
[facility]  [phone] [phone]     ID: Misty [name] DOB: 15-Feb-1976  MR#: [id number]  GYJ#:[id number]  Patient Care Team: Misty Chroman, MD as PCP - General (Internal Medicine) Misty Skates, MD as Consulting Physician (General Surgery) Misty Cruel, MD as Consulting Physician (Oncology) PCP: Misty [name]., MD Misty Held, MD (GYN) Misty Reese M.D. (Plastics)  CHIEF COMPLAINT: Estrogen receptor positive breast cancer CURRENT TREATMENT: adjuvant chemotherapy    BREAST CANCER HISTORY: From the original intake note:  Misty [name] was closely followed for some right breast changes between 20 11/12/2012. Everything seemed stable at that time. In 2014 she was promoted in her job, was working very hard, and actually missed having a mammogram. Late in January 2015 she noted some pain in her right breast and by February when she lifted her arms she noted that the nipple was tilting sideways and seemed a little bit of gathered. She didn't think much of this however. It was not until July that she noted a palpable mass in the lateral right breast. She brought this to her gynecologist's attention and she was set up for diagnostic mammography at [facility] 08/04/2014.   There was an area of distortion in the lateral aspect of the right breast noted on mammography,  corresponding to a palpable firm mass at the 8:00 position of the right breast. Ultrasound confirmed a 2.4 cm irregular hypoechoic spiculated mass in the right breast at the 8:00 position and in addition at the 5:00 position 1 cm from the nipple there was a taller than wide irregular hypoechoic mass measuring 0.8 cm. There was no right axillary adenopathy. In the left breast upper-outer quadrant there was a minimally complicated cyst measuring 0.7 cm.  On 08/11/2014 the patient underwent biopsy of the 2 right breast masses noted on ultrasound. Both showed invasive ductal carcinoma, grade 1,  estrogen receptor 90% positive, progesterone receptor 90% positive, both with strong staining intensity, HER-2 equivocal at 2+ but negative by FISH, with the signals ratio being 1.20 and the number per nucleus 2.1. The proliferation fraction by MIB-1 was 17% ([id number]; this report was reviewed by our pathologist, who concurred, [name] [phone]).  On 08/25/2014 the patient underwent bilateral breast MRI at [place]. This showed, in the right breast, an irregular enhancing mass at the 8:00 position measuring 2.7 cm. Also in the right breast at the 6:00 position there was another enhancing mass measuring 0.6 cm, located 2 cm away from the larger mass. There were also masses in the retroareolar position measuring 0.7 cm, in the posterior central right breast measuring 0.5 cm, and another one just inferior to the midline measuring 5.5 cm. There were no masses or findings of concern in the left breast and no abnormal appearing lymph nodes.  The patient's subsequent history is as detailed below  INTERVAL HISTORY: Misty [name] returns today for follow up of her breast cancer, accompanied by her husband Misty [name]. Today is day 1 cycle 4 of adjuvant doxorubicin and cyclophosphamide given every 2 weeks with Neulasta on day 2. This cycle was delayed one week because of the holidays. The plan is to follow-up with weekly paclitaxel 12  REVIEW OF SYSTEMS: Misty [name] had a terrible time with the second and third cycles of chemotherapy. She had low blood pressure, low sugar, significant fatigue, couldn't eat or drink, had preferred a taste, and had significant problems with constipation. She is no stool softeners twice a day. She is feeling a lot better haven't taken  a week "off" because of the holidays. She remains fatigued. She still having some hemorrhoidal bleeding from the earlier constipation, but her bowel movements are now softer. She admits to anxiety and depression. A detailed review of systems today was otherwise  stable.  PAST MEDICAL HISTORY: Past Medical History  Diagnosis Date  . Thyroid disease   . Celiac disease   . PONV (postoperative nausea and vomiting)   . Hypothyroidism   . Anxiety   . Headache(784.0)   . Cancer     breast cancer    PAST SURGICAL HISTORY: Past Surgical History  Procedure Laterality Date  . Cesarean section    . Gallbladder surgery    . Cholecystectomy    . Simple mastectomy with axillary sentinel node biopsy Right 09/28/2014    Procedure: RIGHT TOTAL MASTECTOMY, RIGHT  AXILLARY SENTINEL NODE BIOPSY;  Surgeon: Misty Skates, MD;  Location: [facility];  Service: General;  Laterality: Right;  . Portacath placement Right 10/22/2014    Procedure: INSERTION PORT-A-CATH;  Surgeon: Misty Skates, MD;  Location: [facility];  Service: General;  Laterality: Right;    FAMILY HISTORY Family History  Problem Relation Age of Onset  . Skin cancer Father   . Breast cancer Maternal Grandmother     dx unknown age  . Breast cancer Other 36    mat great aunt through [facility] with breast cancer   the patient's parents are both living, in their late 46s. The patient had one brother, no sisters. The patient's maternal grandmother was diagnosed with breast cancer in her late 14s. One of that grandmother's sisters was also diagnosed with breast cancer, in her 73s. There is no other history of breast or ovarian cancer in the family  GYNECOLOGIC HISTORY:  No LMP recorded. Menarche age 38, first live birth age 59, the patient is GX P1. She is still having regular periods. 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 Her husband, Misty [name], works as a Engineer, structural in [place]. Their son Misty [name] is 30. The patient attends a local [employer]: Not in place   HEALTH MAINTENANCE: History    Substance Use Topics  . Smoking status: Never Smoker   . Smokeless tobacco: Not on file  . Alcohol Use: No     Colonoscopy:  PAP: 07/29/2014  Bone density:  Lipid panel:  Allergies  Allergen Reactions  . Gluten Meal   . Penicillins Nausea And Vomiting    Current Outpatient Prescriptions  Medication Sig Dispense Refill  . dexamethasone (DECADRON) 4 MG tablet Take 2 tablets by mouth once a day on the day after chemotherapy and then take 2 tablets two times a day for 2 days. Take with food. (Patient not taking: Reported on 12/23/2014) 30 tablet 1  . HYDROcodone-acetaminophen (NORCO) 5-325 MG per tablet Take 1-2 tablets by mouth every 6 (six) hours as needed for moderate pain or severe pain. (Patient not taking: Reported on 12/08/2014) 30 tablet 0  . levothyroxine (SYNTHROID) 100 MCG tablet Take 1 tablet (100 mcg total) by mouth daily before breakfast. 30 tablet 6  . lidocaine-prilocaine (EMLA) cream Apply 1 application topically as needed. Apply over port area 1-2 hours before chemotherapy, cover with plastic wrap (Patient not taking: Reported on 12/23/2014) 30 g 0  . lidocaine-prilocaine (EMLA) cream Apply 1 application topically as needed.  Apply over port area 1-2 hours before chemotherapy, cover with plastic wrap 30 g 0  . LORazepam (ATIVAN) 0.5 MG tablet Take at bedtime as directed (Patient not taking: Reported on 12/23/2014) 30 tablet 0  . ondansetron (ZOFRAN) 8 MG tablet Take 1 tablet (8 mg total) by mouth 2 (two) times daily. (Patient not taking: Reported on 12/23/2014) 30 tablet 1  . prochlorperazine (COMPAZINE) 10 MG tablet Take every 6 hours by mouth as directed (Patient not taking: Reported on 12/08/2014) 30 tablet 1   No current facility-administered medications for this visit.   Facility-Administered Medications Ordered in Other Visits  Medication Dose Route Frequency Provider Last Rate Last Dose  . sodium chloride 0.9 % injection 10 mL  10 mL Intracatheter PRN Misty Cruel, MD   10 mL at 12/30/14 1833    OBJECTIVE: Middle-aged white woman in no acute distress Filed Vitals:   12/30/14 1524  BP: 108/44  Pulse: 84  Temp: 98.6 F (37 C)  Resp: 18     Body mass index is 23.65 kg/(m^2).    ECOG FS:1 - Symptomatic but completely ambulatory  Sclerae unicteric, pupils round and equal Oropharynx clear, dentition in good repair No cervical or supraclavicular adenopathy Lungs no rales or rhonchi Heart regular rate and rhythm Abd soft, nontender, positive bowel sounds MSK no focal spinal tenderness, no upper extremity lymphedema Neuro: nonfocal, well oriented, anxious affect Breasts: deferred  LAB RESULTS:  CMP     Component Value Date/Time   NA 141 12/30/2014 1436   NA 142 09/23/2014 1456   K 4.1 12/30/2014 1436   K 3.9 09/23/2014 1456   CL 104 09/23/2014 1456   CO2 27 12/30/2014 1436   CO2 27 09/23/2014 1456   GLUCOSE 111 12/30/2014 1436   GLUCOSE 71 09/23/2014 1456   BUN 18.3 12/30/2014 1436   BUN 11 09/23/2014 1456   CREATININE 0.7 12/30/2014 1436   CREATININE 0.70 09/23/2014 1456   CALCIUM 9.3 12/30/2014 1436   CALCIUM 9.8 09/23/2014 1456   PROT 6.2* 12/30/2014 1436   PROT 7.7 09/23/2014 1456   ALBUMIN 3.8 12/30/2014 1436   ALBUMIN 4.0 09/23/2014 1456   AST 35* 12/30/2014 1436   AST 19 09/23/2014 1456   ALT 34 12/30/2014 1436   ALT 12 09/23/2014 1456   ALKPHOS 89 12/30/2014 1436   ALKPHOS 60 09/23/2014 1456   BILITOT 0.45 12/30/2014 1436   BILITOT 0.5 09/23/2014 1456   GFRNONAA >90 09/23/2014 1456   GFRAA >90 09/23/2014 1456    I No results found for: SPEP  Lab Results  Component Value Date   WBC 7.4 12/30/2014   NEUTROABS 5.8 12/30/2014   HGB 10.1* 12/30/2014   HCT 29.5* 12/30/2014   MCV 93.7 12/30/2014   PLT 209 12/30/2014      Chemistry      Component Value Date/Time   NA 141 12/30/2014 1436   NA 142 09/23/2014 1456   K 4.1 12/30/2014 1436   K 3.9 09/23/2014 1456   CL 104 09/23/2014 1456   CO2 27  12/30/2014 1436   CO2 27 09/23/2014 1456   BUN 18.3 12/30/2014 1436   BUN 11 09/23/2014 1456   CREATININE 0.7 12/30/2014 1436   CREATININE 0.70 09/23/2014 1456      Component Value Date/Time   CALCIUM 9.3 12/30/2014 1436   CALCIUM 9.8 09/23/2014 1456   ALKPHOS 89 12/30/2014 1436   ALKPHOS 60 09/23/2014 1456   AST 35* 12/30/2014 1436   AST 19 09/23/2014  1456   ALT 34 12/30/2014 1436   ALT 12 09/23/2014 1456   BILITOT 0.45 12/30/2014 1436   BILITOT 0.5 09/23/2014 1456       No results found for: LABCA2  No components found for: LABCA125  No results for input(s): INR in the last 168 hours.  Urinalysis No results found for: COLORURINE  STUDIES: Most recent echocardiogram on 10/27/14 showed an ejection fraction of 50-55%  No results found.  ASSESSMENT: 39 y.o. BRCA negative [place], [place] woman status post biopsy of 2 separate right breast masses 08/11/2014, both positive for an invasive ductal carcinoma, grade 1, estrogen and progesterone receptor strongly positive, with an MIB-1 of 19%, and HER-2 nonamplified  (1) genetics testing sent 08/26/2014, showing no mutations in the BRCA genes; there were also no mutations in ATM, BARD 1, BRIP1, CDH 1, CHE K2, MR E11A, M UT YH, N BN, NF1, PA L B2, PTE N, RAD 50, RAD 50 1C, RAD 50 1D, and T p53.  (2) status post right mastectomy with sentinel lymph node sampling 09/28/2014 for an mpT2 pN1, stage IIB invasive breast cancer (with both ductal and lobular features), grade 2, with negative margins  (3) adjuvant chemotherapy to consist of doxorubicin and cyclophosphamide in dose dense fashion 4, starting 11/11/2014,  to be followed by paclitaxel weekly x 12  (4) radiation to follow chemotherapy.  (5) anti-estrogens to follow radiation  (6) history of celiac disease  PLAN: Misty [name] is completing the first part of her chemotherapy today. This has been a rough fried, and we are going to support her as much as we can, particularly with  fluids over the next several days.  I reassured her that the next part of chemotherapy should be much easier. Today we discussed the possible toxicities, side effects and complications of weekly paclitaxel. She would like to move her treatments to Fridays so she will miss less work. I think this is entirely reasonable. Her first dose will be January 22 and I have reviewed the orders accordingly.  She will not need to take oral steroids for antinausea once she starts the Taxol. Instead she will use ondansetron twice daily getting day 2 and continuing to the morning of day 4. She will use Compazine as needed. Of course she will make every effort to hydrate herself. It may be a good idea given her prior difficulties to set her up for optional fluids on day 2 of each treatment.  Misty [name] and her husband have a good understanding of this plan. They agree with that. They know the goal of treatment is cure. I will call with any problems that may develop before her next visit here. Misty Cruel, MD   12/30/2014 6:39 PM

## 2014-12-31 ENCOUNTER — Telehealth: Payer: Self-pay | Admitting: *Deleted

## 2014-12-31 ENCOUNTER — Other Ambulatory Visit: Payer: Self-pay | Admitting: *Deleted

## 2014-12-31 ENCOUNTER — Ambulatory Visit (HOSPITAL_BASED_OUTPATIENT_CLINIC_OR_DEPARTMENT_OTHER): Payer: BLUE CROSS/BLUE SHIELD

## 2014-12-31 ENCOUNTER — Ambulatory Visit: Payer: BC Managed Care – PPO

## 2014-12-31 DIAGNOSIS — C50511 Malignant neoplasm of lower-outer quadrant of right female breast: Secondary | ICD-10-CM

## 2014-12-31 DIAGNOSIS — C50311 Malignant neoplasm of lower-inner quadrant of right female breast: Secondary | ICD-10-CM

## 2014-12-31 MED ORDER — SODIUM CHLORIDE 0.9 % IJ SOLN
10.0000 mL | INTRAMUSCULAR | Status: DC | PRN
  Administered 2014-12-31: 10 mL via INTRAVENOUS
  Filled 2014-12-31: qty 10

## 2014-12-31 MED ORDER — PEGFILGRASTIM INJECTION 6 MG/0.6ML ~~LOC~~: 6.0000 mg | PREFILLED_SYRINGE | Freq: Once | SUBCUTANEOUS | Status: DC

## 2014-12-31 MED ORDER — SODIUM CHLORIDE 0.9 % IV SOLN
Freq: Once | INTRAVENOUS | Status: AC
  Administered 2014-12-31: 10:00:00 via INTRAVENOUS

## 2014-12-31 MED ORDER — HEPARIN SOD (PORK) LOCK FLUSH 100 UNIT/ML IV SOLN
500.0000 [IU] | Freq: Once | INTRAVENOUS | Status: AC | PRN
  Administered 2014-12-31: 500 [IU]
  Filled 2014-12-31: qty 5

## 2014-12-31 NOTE — Telephone Encounter (Signed)
Per staff message and POF I have scheduled appts. Advised scheduler of appts. JMW  

## 2014-12-31 NOTE — Patient Instructions (Signed)
Dehydration, Adult Dehydration is when you lose more fluids from the body than you take in. Vital organs like the kidneys, brain, and heart cannot function without a proper amount of fluids and salt. Any loss of fluids from the body can cause dehydration.  CAUSES   Vomiting.  Diarrhea.  Excessive sweating.  Excessive urine output.  Fever. SYMPTOMS  Mild dehydration  Thirst.  Dry lips.  Slightly dry mouth. Moderate dehydration  Very dry mouth.  Sunken eyes.  Skin does not bounce back quickly when lightly pinched and released.  Dark urine and decreased urine production.  Decreased tear production.  Headache. Severe dehydration  Very dry mouth.  Extreme thirst.  Rapid, weak pulse (more than 100 beats per minute at rest).  Cold hands and feet.  Not able to sweat in spite of heat and temperature.  Rapid breathing.  Blue lips.  Confusion and lethargy.  Difficulty being awakened.  Minimal urine production.  No tears. DIAGNOSIS  Your caregiver will diagnose dehydration based on your symptoms and your exam. Blood and urine tests will help confirm the diagnosis. The diagnostic evaluation should also identify the cause of dehydration. TREATMENT  Treatment of mild or moderate dehydration can often be done at home by increasing the amount of fluids that you drink. It is best to drink small amounts of fluid more often. Drinking too much at one time can make vomiting worse. Refer to the home care instructions below. Severe dehydration needs to be treated at the hospital where you will probably be given intravenous (IV) fluids that contain water and electrolytes. HOME CARE INSTRUCTIONS   Ask your caregiver about specific rehydration instructions.  Drink enough fluids to keep your urine clear or pale yellow.  Drink small amounts frequently if you have nausea and vomiting.  Eat as you normally do.  Avoid:  Foods or drinks high in sugar.  Carbonated  drinks.  Juice.  Extremely hot or cold fluids.  Drinks with caffeine.  Fatty, greasy foods.  Alcohol.  Tobacco.  Overeating.  Gelatin desserts.  Wash your hands well to avoid spreading bacteria and viruses.  Only take over-the-counter or prescription medicines for pain, discomfort, or fever as directed by your caregiver.  Ask your caregiver if you should continue all prescribed and over-the-counter medicines.  Keep all follow-up appointments with your caregiver. SEEK MEDICAL CARE IF:  You have abdominal pain and it increases or stays in one area (localizes).  You have a rash, stiff neck, or severe headache.  You are irritable, sleepy, or difficult to awaken.  You are weak, dizzy, or extremely thirsty. SEEK IMMEDIATE MEDICAL CARE IF:   You are unable to keep fluids down or you get worse despite treatment.  You have frequent episodes of vomiting or diarrhea.  You have blood or green matter (bile) in your vomit.  You have blood in your stool or your stool looks black and tarry.  You have not urinated in 6 to 8 hours, or you have only urinated a small amount of very dark urine.  You have a fever.  You faint. MAKE SURE YOU:   Understand these instructions.  Will watch your condition.  Will get help right away if you are not doing well or get worse. Document Released: 12/10/2005 Document Revised: 03/03/2012 Document Reviewed: 07/30/2011 Exit[employer] Patient Information 2015 [employer]. This information is not intended to replace advice given to you by your health care provider. Make sure you discuss any questions you have with your health care   provider.  

## 2015-01-01 ENCOUNTER — Ambulatory Visit: Payer: BLUE CROSS/BLUE SHIELD

## 2015-01-01 ENCOUNTER — Ambulatory Visit (HOSPITAL_BASED_OUTPATIENT_CLINIC_OR_DEPARTMENT_OTHER): Payer: BLUE CROSS/BLUE SHIELD

## 2015-01-01 DIAGNOSIS — C50311 Malignant neoplasm of lower-inner quadrant of right female breast: Secondary | ICD-10-CM

## 2015-01-01 DIAGNOSIS — C50511 Malignant neoplasm of lower-outer quadrant of right female breast: Secondary | ICD-10-CM

## 2015-01-01 DIAGNOSIS — Z5189 Encounter for other specified aftercare: Secondary | ICD-10-CM

## 2015-01-01 MED ORDER — SODIUM CHLORIDE 0.9 % IV SOLN
Freq: Once | INTRAVENOUS | Status: AC
  Administered 2015-01-01: 08:00:00 via INTRAVENOUS

## 2015-01-01 MED ORDER — PEGFILGRASTIM INJECTION 6 MG/0.6ML ~~LOC~~
6.0000 mg | PREFILLED_SYRINGE | Freq: Once | SUBCUTANEOUS | Status: AC
  Administered 2015-01-01: 6 mg via SUBCUTANEOUS

## 2015-01-01 MED ORDER — SODIUM CHLORIDE 0.9 % IJ SOLN
10.0000 mL | Freq: Once | INTRAMUSCULAR | Status: AC
  Administered 2015-01-01: 10 mL via INTRAVENOUS
  Filled 2015-01-01: qty 10

## 2015-01-01 MED ORDER — ONDANSETRON 8 MG/50ML IVPB (CHCC): 8.0000 mg | INTRAVENOUS | Status: DC | PRN

## 2015-01-01 MED ORDER — HEPARIN SOD (PORK) LOCK FLUSH 100 UNIT/ML IV SOLN
500.0000 [IU] | Freq: Once | INTRAVENOUS | Status: AC | PRN
  Administered 2015-01-01: 500 [IU]
  Filled 2015-01-01: qty 5

## 2015-01-01 NOTE — Patient Instructions (Signed)
Pegfilgrastim injection What is this medicine? PEGFILGRASTIM (peg fil GRA stim) is a long-acting granulocyte colony-stimulating factor that stimulates the growth of neutrophils, a type of white blood cell important in the body's fight against infection. It is used to reduce the incidence of fever and infection in patients with certain types of cancer who are receiving chemotherapy that affects the bone marrow. This medicine may be used for other purposes; ask your health care provider or pharmacist if you have questions. COMMON BRAND NAME(S): Neulasta What should I tell my health care provider before I take this medicine? They need to know if you have any of these conditions: -latex allergy -ongoing radiation therapy -sickle cell disease -skin reactions to acrylic adhesives (On-Body Injector only) -an unusual or allergic reaction to pegfilgrastim, filgrastim, other medicines, foods, dyes, or preservatives -pregnant or trying to get pregnant -breast-feeding How should I use this medicine? This medicine is for injection under the skin. If you get this medicine at home, you will be taught how to prepare and give the pre-filled syringe or how to use the On-body Injector. Refer to the patient Instructions for Use for detailed instructions. Use exactly as directed. Take your medicine at regular intervals. Do not take your medicine more often than directed. It is important that you put your used needles and syringes in a special sharps container. Do not put them in a trash can. If you do not have a sharps container, call your pharmacist or healthcare provider to get one. Talk to your pediatrician regarding the use of this medicine in children. Special care may be needed. Overdosage: If you think you have taken too much of this medicine contact a poison control center or emergency room at once. NOTE: This medicine is only for you. Do not share this medicine with others. What if I miss a dose? It is  important not to miss your dose. Call your doctor or health care professional if you miss your dose. If you miss a dose due to an On-body Injector failure or leakage, a new dose should be administered as soon as possible using a single prefilled syringe for manual use. What may interact with this medicine? Interactions have not been studied. Give your health care provider a list of all the medicines, herbs, non-prescription drugs, or dietary supplements you use. Also tell them if you smoke, drink alcohol, or use illegal drugs. Some items may interact with your medicine. This list may not describe all possible interactions. Give your health care provider a list of all the medicines, herbs, non-prescription drugs, or dietary supplements you use. Also tell them if you smoke, drink alcohol, or use illegal drugs. Some items may interact with your medicine. What should I watch for while using this medicine? You may need blood work done while you are taking this medicine. If you are going to need a MRI, CT scan, or other procedure, tell your doctor that you are using this medicine (On-Body Injector only). What side effects may I notice from receiving this medicine? Side effects that you should report to your doctor or health care professional as soon as possible: -allergic reactions like skin rash, itching or hives, swelling of the face, lips, or tongue -dizziness -fever -pain, redness, or irritation at site where injected -pinpoint red spots on the skin -shortness of breath or breathing problems -stomach or side pain, or pain at the shoulder -swelling -tiredness -trouble passing urine Side effects that usually do not require medical attention (report to your doctor   or health care professional if they continue or are bothersome): -bone pain -muscle pain This list may not describe all possible side effects. Call your doctor for medical advice about side effects. You may report side effects to FDA at  1-800-FDA-1088. Where should I keep my medicine? Keep out of the reach of children. Store pre-filled syringes in a refrigerator between 2 and 8 degrees C (36 and 46 degrees F). Do not freeze. Keep in carton to protect from light. Throw away this medicine if it is left out of the refrigerator for more than 48 hours. Throw away any unused medicine after the expiration date. NOTE: This sheet is a summary. It may not cover all possible information. If you have questions about this medicine, talk to your doctor, pharmacist, or health care provider.  2015, [employer] Standard. (2014-03-11 16:14:05)  

## 2015-01-03 ENCOUNTER — Ambulatory Visit (HOSPITAL_BASED_OUTPATIENT_CLINIC_OR_DEPARTMENT_OTHER): Payer: BLUE CROSS/BLUE SHIELD

## 2015-01-03 DIAGNOSIS — C50511 Malignant neoplasm of lower-outer quadrant of right female breast: Secondary | ICD-10-CM

## 2015-01-03 DIAGNOSIS — C50311 Malignant neoplasm of lower-inner quadrant of right female breast: Secondary | ICD-10-CM

## 2015-01-03 MED ORDER — SODIUM CHLORIDE 0.9 % IV SOLN
Freq: Once | INTRAVENOUS | Status: AC
  Administered 2015-01-03: 09:00:00 via INTRAVENOUS

## 2015-01-03 MED ORDER — ALTEPLASE 2 MG IJ SOLR
2.0000 mg | Freq: Once | INTRAMUSCULAR | Status: DC | PRN
  Filled 2015-01-03: qty 2

## 2015-01-03 MED ORDER — ONDANSETRON 8 MG/50ML IVPB (CHCC)
8.0000 mg | INTRAVENOUS | Status: DC | PRN
  Administered 2015-01-03: 8 mg via INTRAVENOUS

## 2015-01-03 MED ORDER — HEPARIN SOD (PORK) LOCK FLUSH 100 UNIT/ML IV SOLN
500.0000 [IU] | Freq: Once | INTRAVENOUS | Status: AC | PRN
  Administered 2015-01-03: 500 [IU]
  Filled 2015-01-03: qty 5

## 2015-01-03 MED ORDER — ONDANSETRON 8 MG/NS 50 ML IVPB
INTRAVENOUS | Status: AC
  Filled 2015-01-03: qty 8

## 2015-01-03 NOTE — Patient Instructions (Signed)
Dehydration, Adult Dehydration is when you lose more fluids from the body than you take in. Vital organs like the kidneys, brain, and heart cannot function without a proper amount of fluids and salt. Any loss of fluids from the body can cause dehydration.  CAUSES   Vomiting.  Diarrhea.  Excessive sweating.  Excessive urine output.  Fever. SYMPTOMS  Mild dehydration  Thirst.  Dry lips.  Slightly dry mouth. Moderate dehydration  Very dry mouth.  Sunken eyes.  Skin does not bounce back quickly when lightly pinched and released.  Dark urine and decreased urine production.  Decreased tear production.  Headache. Severe dehydration  Very dry mouth.  Extreme thirst.  Rapid, weak pulse (more than 100 beats per minute at rest).  Cold hands and feet.  Not able to sweat in spite of heat and temperature.  Rapid breathing.  Blue lips.  Confusion and lethargy.  Difficulty being awakened.  Minimal urine production.  No tears. DIAGNOSIS  Your caregiver will diagnose dehydration based on your symptoms and your exam. Blood and urine tests will help confirm the diagnosis. The diagnostic evaluation should also identify the cause of dehydration. TREATMENT  Treatment of mild or moderate dehydration can often be done at home by increasing the amount of fluids that you drink. It is best to drink small amounts of fluid more often. Drinking too much at one time can make vomiting worse. Refer to the home care instructions below. Severe dehydration needs to be treated at the hospital where you will probably be given intravenous (IV) fluids that contain water and electrolytes. HOME CARE INSTRUCTIONS   Ask your caregiver about specific rehydration instructions.  Drink enough fluids to keep your urine clear or pale yellow.  Drink small amounts frequently if you have nausea and vomiting.  Eat as you normally do.  Avoid:  Foods or drinks high in sugar.  Carbonated  drinks.  Juice.  Extremely hot or cold fluids.  Drinks with caffeine.  Fatty, greasy foods.  Alcohol.  Tobacco.  Overeating.  Gelatin desserts.  Wash your hands well to avoid spreading bacteria and viruses.  Only take over-the-counter or prescription medicines for pain, discomfort, or fever as directed by your caregiver.  Ask your caregiver if you should continue all prescribed and over-the-counter medicines.  Keep all follow-up appointments with your caregiver. SEEK MEDICAL CARE IF:  You have abdominal pain and it increases or stays in one area (localizes).  You have a rash, stiff neck, or severe headache.  You are irritable, sleepy, or difficult to awaken.  You are weak, dizzy, or extremely thirsty. SEEK IMMEDIATE MEDICAL CARE IF:   You are unable to keep fluids down or you get worse despite treatment.  You have frequent episodes of vomiting or diarrhea.  You have blood or green matter (bile) in your vomit.  You have blood in your stool or your stool looks black and tarry.  You have not urinated in 6 to 8 hours, or you have only urinated a small amount of very dark urine.  You have a fever.  You faint. MAKE SURE YOU:   Understand these instructions.  Will watch your condition.  Will get help right away if you are not doing well or get worse. Document Released: 12/10/2005 Document Revised: 03/03/2012 Document Reviewed: 07/30/2011 Exit[employer] Patient Information 2015 [employer]. This information is not intended to replace advice given to you by your health care provider. Make sure you discuss any questions you have with your health care   provider.  

## 2015-01-04 ENCOUNTER — Ambulatory Visit (HOSPITAL_BASED_OUTPATIENT_CLINIC_OR_DEPARTMENT_OTHER): Payer: BLUE CROSS/BLUE SHIELD

## 2015-01-04 DIAGNOSIS — C50511 Malignant neoplasm of lower-outer quadrant of right female breast: Secondary | ICD-10-CM

## 2015-01-04 DIAGNOSIS — C50311 Malignant neoplasm of lower-inner quadrant of right female breast: Secondary | ICD-10-CM

## 2015-01-04 DIAGNOSIS — E860 Dehydration: Secondary | ICD-10-CM

## 2015-01-04 MED ORDER — SODIUM CHLORIDE 0.9 % IV SOLN
1000.0000 mL | Freq: Once | INTRAVENOUS | Status: AC
  Administered 2015-01-04: 09:00:00 via INTRAVENOUS

## 2015-01-04 MED ORDER — HEPARIN SOD (PORK) LOCK FLUSH 100 UNIT/ML IV SOLN
500.0000 [IU] | Freq: Once | INTRAVENOUS | Status: AC | PRN
  Administered 2015-01-04: 500 [IU]
  Filled 2015-01-04: qty 5

## 2015-01-04 MED ORDER — SODIUM CHLORIDE 0.9 % IJ SOLN
10.0000 mL | Freq: Once | INTRAMUSCULAR | Status: AC
  Administered 2015-01-04: 10 mL via INTRAVENOUS
  Filled 2015-01-04: qty 10

## 2015-01-04 MED ORDER — ONDANSETRON 8 MG/50ML IVPB (CHCC)
8.0000 mg | INTRAVENOUS | Status: DC | PRN
  Administered 2015-01-04: 8 mg via INTRAVENOUS

## 2015-01-04 MED ORDER — ONDANSETRON 8 MG/NS 50 ML IVPB
INTRAVENOUS | Status: AC
  Filled 2015-01-04: qty 8

## 2015-01-04 NOTE — Patient Instructions (Signed)

## 2015-01-06 ENCOUNTER — Ambulatory Visit (HOSPITAL_BASED_OUTPATIENT_CLINIC_OR_DEPARTMENT_OTHER): Payer: BLUE CROSS/BLUE SHIELD | Admitting: Nurse Practitioner

## 2015-01-06 ENCOUNTER — Ambulatory Visit: Payer: BC Managed Care – PPO | Admitting: Nurse Practitioner

## 2015-01-06 ENCOUNTER — Other Ambulatory Visit (HOSPITAL_BASED_OUTPATIENT_CLINIC_OR_DEPARTMENT_OTHER): Payer: BLUE CROSS/BLUE SHIELD

## 2015-01-06 ENCOUNTER — Encounter: Payer: Self-pay | Admitting: Nurse Practitioner

## 2015-01-06 ENCOUNTER — Other Ambulatory Visit: Payer: BC Managed Care – PPO

## 2015-01-06 VITALS — BP 109/48 | HR 81 | Temp 98.1°F | Resp 18 | Ht 61.0 in | Wt 120.4 lb

## 2015-01-06 DIAGNOSIS — C50511 Malignant neoplasm of lower-outer quadrant of right female breast: Secondary | ICD-10-CM

## 2015-01-06 DIAGNOSIS — R634 Abnormal weight loss: Secondary | ICD-10-CM

## 2015-01-06 DIAGNOSIS — C50311 Malignant neoplasm of lower-inner quadrant of right female breast: Secondary | ICD-10-CM

## 2015-01-06 DIAGNOSIS — E860 Dehydration: Secondary | ICD-10-CM

## 2015-01-06 DIAGNOSIS — Z170 Estrogen receptor positive status [ER+]: Secondary | ICD-10-CM

## 2015-01-06 DIAGNOSIS — D6481 Anemia due to antineoplastic chemotherapy: Secondary | ICD-10-CM

## 2015-01-06 DIAGNOSIS — Z803 Family history of malignant neoplasm of breast: Secondary | ICD-10-CM

## 2015-01-06 DIAGNOSIS — K900 Celiac disease: Secondary | ICD-10-CM

## 2015-01-06 LAB — CBC WITH DIFFERENTIAL/PLATELET
BASO%: 0.5 % (ref 0.0–2.0)
Basophils Absolute: 0 10*3/uL (ref 0.0–0.1)
EOS ABS: 0.3 10*3/uL (ref 0.0–0.5)
EOS%: 4.8 % (ref 0.0–7.0)
HEMATOCRIT: 29.7 % — AB (ref 34.8–46.6)
HGB: 10 g/dL — ABNORMAL LOW (ref 11.6–15.9)
LYMPH#: 0.3 10*3/uL — AB (ref 0.9–3.3)
LYMPH%: 5.4 % — ABNORMAL LOW (ref 14.0–49.7)
MCH: 32.3 pg (ref 25.1–34.0)
MCHC: 33.8 g/dL (ref 31.5–36.0)
MCV: 95.7 fL (ref 79.5–101.0)
MONO#: 0.2 10*3/uL (ref 0.1–0.9)
MONO%: 4.3 % (ref 0.0–14.0)
NEUT#: 4.8 10*3/uL (ref 1.5–6.5)
NEUT%: 85 % — ABNORMAL HIGH (ref 38.4–76.8)
PLATELETS: 112 10*3/uL — AB (ref 145–400)
RBC: 3.1 10*6/uL — AB (ref 3.70–5.45)
RDW: 19.3 % — AB (ref 11.2–14.5)
WBC: 5.6 10*3/uL (ref 3.9–10.3)

## 2015-01-06 LAB — COMPREHENSIVE METABOLIC PANEL (CC13)
ALT: 16 U/L (ref 0–55)
AST: 11 U/L (ref 5–34)
Albumin: 3.7 g/dL (ref 3.5–5.0)
Alkaline Phosphatase: 121 U/L (ref 40–150)
Anion Gap: 6 mEq/L (ref 3–11)
BILIRUBIN TOTAL: 0.66 mg/dL (ref 0.20–1.20)
BUN: 11.4 mg/dL (ref 7.0–26.0)
CHLORIDE: 103 meq/L (ref 98–109)
CO2: 31 mEq/L — ABNORMAL HIGH (ref 22–29)
CREATININE: 0.7 mg/dL (ref 0.6–1.1)
Calcium: 9.6 mg/dL (ref 8.4–10.4)
Glucose: 95 mg/dl (ref 70–140)
Potassium: 5 mEq/L (ref 3.5–5.1)
Sodium: 141 mEq/L (ref 136–145)
Total Protein: 6.2 g/dL — ABNORMAL LOW (ref 6.4–8.3)

## 2015-01-06 NOTE — Progress Notes (Signed)
[facility]  [phone] [phone]     ID: Misty [name] DOB: 1976/06/27  MR#: [id number]  NSN#:[id number]  Patient Care Team: Ignatius Specking, MD as PCP - General (Internal Medicine) Claud Kelp, MD as Consulting Physician (General Surgery) Lowella Dell, MD as Consulting Physician (Oncology) PCP: Ignatius Specking., MD Ruthy Dick, MD (GYN) Etter Sjogren M.D. (Plastics)  CHIEF COMPLAINT: Estrogen receptor positive breast cancer CURRENT TREATMENT: adjuvant chemotherapy    BREAST CANCER HISTORY: From the original intake note:  Misty [name] was closely followed for some right breast changes between 20 11/12/2012. Everything seemed stable at that time. In 2014 she was promoted in her job, was working very hard, and actually missed having a mammogram. Late in January 2015 she noted some pain in her right breast and by February when she lifted her arms she noted that the nipple was tilting sideways and seemed a little bit of gathered. She didn't think much of this however. It was not until July that she noted a palpable mass in the lateral right breast. She brought this to her gynecologist's attention and she was set up for diagnostic mammography at [facility] 08/04/2014.   There was [name] area of distortion in the lateral aspect of the right breast noted on mammography,  corresponding to a palpable firm mass at the 8:00 position of the right breast. Ultrasound confirmed a 2.4 cm irregular hypoechoic spiculated mass in the right breast at the 8:00 position and in addition at the 5:00 position 1 cm from the nipple there was a taller than wide irregular hypoechoic mass measuring 0.8 cm. There was no right axillary adenopathy. In the left breast upper-outer quadrant there was a minimally complicated cyst measuring 0.7 cm.  On 08/11/2014 the patient underwent biopsy of the 2 right breast masses noted on ultrasound. Both showed invasive ductal carcinoma, grade 1,  estrogen receptor 90% positive, progesterone receptor 90% positive, both with strong staining intensity, HER-2 equivocal at 2+ but negative by FISH, with the signals ratio being 1.20 and the number per nucleus 2.1. The proliferation fraction by MIB-1 was 17% ([id number]; this report was reviewed by our pathologist, who concurred, [name] [id number]).  On 08/25/2014 the patient underwent bilateral breast MRI at [facility] imaging. This showed, in the right breast, [name] irregular enhancing mass at the 8:00 position measuring 2.7 cm. Also in the right breast at the 6:00 position there was another enhancing mass measuring 0.6 cm, located 2 cm away from the larger mass. There were also masses in the retroareolar position measuring 0.7 cm, in the posterior central right breast measuring 0.5 cm, and another one just inferior to the midline measuring 5.5 cm. There were no masses or findings of concern in the left breast and no abnormal appearing lymph nodes.  The patient's subsequent history is as detailed below  INTERVAL HISTORY: Misty [name] returns today for follow up of her breast cancer, accompanied by her mother. Last week she completed her 4th and final cycle of cyclophosphamide and doxorubicin. She is to begin weekly taxol x 12 starting next week. She is anxious about this drug, especially about having permanent nerve damage. She wonders if it is worth it.   REVIEW OF SYSTEMS: Misty [name] got chemo late in the afternoon last week, so her neulasta was pushed to Saturday morning. She says this threw off her anti-emetic schedule some how and she vomited once on Monday. She believes the zofran makes her constipated. However, she has been slacking on her fluid  intake this week and gets dehydrated easily. She has lost 5lbs this week, and her appetite has been decreased. She denies shortness of breath, chest pain, cough, or palpitations. She doesn't always sleep well however, despite her fatigue. She had generalized body aches after  her neulasta injections. A detailed review of systems is otherwise negative.   PAST MEDICAL HISTORY: Past Medical History  Diagnosis Date  . Thyroid disease   . Celiac disease   . PONV (postoperative nausea and vomiting)   . Hypothyroidism   . Anxiety   . Headache(784.0)   . Cancer     breast cancer    PAST SURGICAL HISTORY: Past Surgical History  Procedure Laterality Date  . Cesarean section    . Gallbladder surgery    . Cholecystectomy    . Simple mastectomy with axillary sentinel node biopsy Right 09/28/2014    Procedure: RIGHT TOTAL MASTECTOMY, RIGHT  AXILLARY SENTINEL NODE BIOPSY;  Surgeon: Claud Kelp, MD;  Location: [facility];  Service: General;  Laterality: Right;  . Portacath placement Right 10/22/2014    Procedure: INSERTION PORT-A-CATH;  Surgeon: Claud Kelp, MD;  Location: [facility];  Service: General;  Laterality: Right;    FAMILY HISTORY Family History  Problem Relation Age of Onset  . Skin cancer Father   . Breast cancer Maternal Grandmother     dx unknown age  . Breast cancer Other 55    mat great aunt through [facility] with breast cancer   the patient's parents are both living, in their late 28s. The patient had one brother, no sisters. The patient's maternal grandmother was diagnosed with breast cancer in her late 83s. One of that grandmother's sisters was also diagnosed with breast cancer, in her 47s. There is no other history of breast or ovarian cancer in the family  GYNECOLOGIC HISTORY:  No LMP recorded. Menarche age 39, first live birth age 39, the patient is GX P1. She is still having regular periods. 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 Her husband, Molly Maduro "Reita Cliche" Gregary Cromer Junior, works as a Emergency planning/management officer in [place]. Their son Durene Cal is 77. The patient attends a local [employer]    ADVANCED DIRECTIVES: Not in place   HEALTH MAINTENANCE: History  Substance Use Topics  . Smoking status: Never Smoker   . Smokeless tobacco: Not on file  . Alcohol Use: No     Colonoscopy:  PAP: 07/29/2014  Bone density:  Lipid panel:  Allergies  Allergen Reactions  . Gluten Meal   . Penicillins Nausea And Vomiting    Current Outpatient Prescriptions  Medication Sig Dispense Refill  . levothyroxine (SYNTHROID) 100 MCG tablet Take 1 tablet (100 mcg total) by mouth daily before breakfast. 30 tablet 6  . LORazepam (ATIVAN) 0.5 MG tablet Take 1 tablet (0.5 mg total) by mouth every 6 (six) hours as needed (Nausea or vomiting). 30 tablet 0  . prochlorperazine (COMPAZINE) 10 MG tablet Take 1 tablet (10 mg total) by mouth every 6 (six) hours as needed (Nausea or vomiting). 30 tablet 1  . HYDROcodone-acetaminophen (NORCO) 5-325 MG per tablet Take 1-2 tablets by mouth every 6 (six) hours as needed for moderate pain or severe pain. (Patient not taking: Reported on 12/08/2014) 30 tablet 0  . ondansetron (ZOFRAN) 8 MG tablet Take 1 tablet (8 mg total) by mouth 2 (two) times daily. Start  the day after chemo for 2 days. Then take as needed for nausea or vomiting. (Patient not taking: Reported on 01/06/2015) 30 tablet 1   No current facility-administered medications for this visit.    OBJECTIVE: Middle-aged white woman in no acute distress Filed Vitals:   01/06/15 0852  BP: 109/48  Pulse: 81  Temp: 98.1 F (36.7 C)  Resp: 18     Body mass index is 22.76 kg/(m^2).    ECOG FS:1 - Symptomatic but completely ambulatory  Skin: warm, dry  HEENT: sclerae anicteric, conjunctivae pink, oropharynx clear. No thrush or mucositis.  Lymph Nodes: No cervical or supraclavicular lymphadenopathy  Lungs: clear to auscultation bilaterally, no rales, wheezes, or rhonci  Heart: regular rate and rhythm  Abdomen: round, soft, non tender, positive bowel sounds  Musculoskeletal: No focal spinal  tenderness, no peripheral edema  Neuro: non focal, well oriented, positive affect  Breast: deferred  LAB RESULTS:  CMP     Component Value Date/Time   NA 141 01/06/2015 0841   NA 142 09/23/2014 1456   K 5.0 01/06/2015 0841   K 3.9 09/23/2014 1456   CL 104 09/23/2014 1456   CO2 31* 01/06/2015 0841   CO2 27 09/23/2014 1456   GLUCOSE 95 01/06/2015 0841   GLUCOSE 71 09/23/2014 1456   BUN 11.4 01/06/2015 0841   BUN 11 09/23/2014 1456   CREATININE 0.7 01/06/2015 0841   CREATININE 0.70 09/23/2014 1456   CALCIUM 9.6 01/06/2015 0841   CALCIUM 9.8 09/23/2014 1456   PROT 6.2* 01/06/2015 0841   PROT 7.7 09/23/2014 1456   ALBUMIN 3.7 01/06/2015 0841   ALBUMIN 4.0 09/23/2014 1456   AST 11 01/06/2015 0841   AST 19 09/23/2014 1456   ALT 16 01/06/2015 0841   ALT 12 09/23/2014 1456   ALKPHOS 121 01/06/2015 0841   ALKPHOS 60 09/23/2014 1456   BILITOT 0.66 01/06/2015 0841   BILITOT 0.5 09/23/2014 1456   GFRNONAA >90 09/23/2014 1456   GFRAA >90 09/23/2014 1456    I No results found for: SPEP  Lab Results  Component Value Date   WBC 5.6 01/06/2015   NEUTROABS 4.8 01/06/2015   HGB 10.0* 01/06/2015   HCT 29.7* 01/06/2015   MCV 95.7 01/06/2015   PLT 112* 01/06/2015      Chemistry      Component Value Date/Time   NA 141 01/06/2015 0841   NA 142 09/23/2014 1456   K 5.0 01/06/2015 0841   K 3.9 09/23/2014 1456   CL 104 09/23/2014 1456   CO2 31* 01/06/2015 0841   CO2 27 09/23/2014 1456   BUN 11.4 01/06/2015 0841   BUN 11 09/23/2014 1456   CREATININE 0.7 01/06/2015 0841   CREATININE 0.70 09/23/2014 1456      Component Value Date/Time   CALCIUM 9.6 01/06/2015 0841   CALCIUM 9.8 09/23/2014 1456   ALKPHOS 121 01/06/2015 0841   ALKPHOS 60 09/23/2014 1456   AST 11 01/06/2015 0841   AST 19 09/23/2014 1456   ALT 16 01/06/2015 0841   ALT 12 09/23/2014 1456   BILITOT 0.66 01/06/2015 0841   BILITOT 0.5 09/23/2014 1456       No results found for: LABCA2  No components found  for: LABCA125  No results for input(s): INR in the last 168 hours.  Urinalysis No results found for: COLORURINE  STUDIES: Most recent echocardiogram on 10/27/14 showed [name] ejection fraction of 50-55%  No results found.  ASSESSMENT: 39 y.o. BRCA negative [place], [place] woman status post  biopsy of 2 separate right breast masses 08/11/2014, both positive for [name] invasive ductal carcinoma, grade 1, estrogen and progesterone receptor strongly positive, with [name] MIB-1 of 19%, and HER-2 nonamplified  (1) genetics testing sent 08/26/2014, showing no mutations in the BRCA genes; there were also no mutations in ATM, BARD 1, BRIP1, CDH 1, CHE K2, MR E11A, M UT YH, N BN, NF1, PA L B2, PTE N, RAD 50, RAD 50 1C, RAD 50 1D, and T p53.  (2) status post right mastectomy with sentinel lymph node sampling 09/28/2014 for [name] mpT2 pN1, stage IIB invasive breast cancer (with both ductal and lobular features), grade 2, with negative margins  (3) adjuvant chemotherapy to consist of doxorubicin and cyclophosphamide in dose dense fashion 4, starting 11/11/2014,  to be followed by paclitaxel weekly x 12  (4) radiation to follow chemotherapy.  (5) anti-estrogens to follow radiation  (6) history of celiac disease  PLAN: Ashelyn fought her way through her first regimen of chemotherapy. The labs were reviewed in detail and are entirely stable. She has treatment related anemia with [name] hgb of 10.0, but is asymptomatic at this time. We will continue to monitor this value.   Kyaira and I spent [name] additional 20 minutes reviewing her antiemetic schedule and side effects/toxicities of taxol. She is very concerned about permanent neuropathy damage, but I assured her that we will stop the drug as soon as we notice this becoming a problem for her. She will receive fluids on day 2 of treatment for her dehydration.  Mercia will return next week for labs, [name] office visit, and cycle 1 of paclitaxel. She understands agrees with this  plan. She knows the goal of treatment in her case is cure. She has been encouraged to call with any issues that might arise before her next visit here.   Marcelino Duster, NP   01/06/2015 10:43 AM

## 2015-01-13 ENCOUNTER — Ambulatory Visit: Payer: BC Managed Care – PPO | Admitting: Nurse Practitioner

## 2015-01-13 ENCOUNTER — Ambulatory Visit: Payer: BC Managed Care – PPO

## 2015-01-13 ENCOUNTER — Telehealth: Payer: Self-pay | Admitting: Nurse Practitioner

## 2015-01-13 ENCOUNTER — Other Ambulatory Visit: Payer: BC Managed Care – PPO

## 2015-01-13 NOTE — Telephone Encounter (Signed)
pt cld to CX all appts for 1/22 & 1/23-sent email to desk nurse,HF, charge nurse& MW to adv

## 2015-01-14 ENCOUNTER — Other Ambulatory Visit: Payer: BLUE CROSS/BLUE SHIELD

## 2015-01-14 ENCOUNTER — Ambulatory Visit: Payer: BLUE CROSS/BLUE SHIELD | Admitting: Nurse Practitioner

## 2015-01-14 ENCOUNTER — Ambulatory Visit: Payer: BLUE CROSS/BLUE SHIELD

## 2015-01-15 ENCOUNTER — Ambulatory Visit: Payer: BLUE CROSS/BLUE SHIELD

## 2015-01-20 ENCOUNTER — Ambulatory Visit: Payer: BC Managed Care – PPO | Admitting: Nurse Practitioner

## 2015-01-20 ENCOUNTER — Other Ambulatory Visit: Payer: BC Managed Care – PPO

## 2015-01-21 ENCOUNTER — Other Ambulatory Visit (HOSPITAL_BASED_OUTPATIENT_CLINIC_OR_DEPARTMENT_OTHER): Payer: BLUE CROSS/BLUE SHIELD

## 2015-01-21 ENCOUNTER — Ambulatory Visit: Payer: BLUE CROSS/BLUE SHIELD | Admitting: Nurse Practitioner

## 2015-01-21 ENCOUNTER — Ambulatory Visit (HOSPITAL_BASED_OUTPATIENT_CLINIC_OR_DEPARTMENT_OTHER): Payer: BLUE CROSS/BLUE SHIELD | Admitting: Nurse Practitioner

## 2015-01-21 ENCOUNTER — Other Ambulatory Visit: Payer: BLUE CROSS/BLUE SHIELD

## 2015-01-21 ENCOUNTER — Other Ambulatory Visit: Payer: Self-pay | Admitting: Emergency Medicine

## 2015-01-21 ENCOUNTER — Ambulatory Visit (HOSPITAL_BASED_OUTPATIENT_CLINIC_OR_DEPARTMENT_OTHER): Payer: BLUE CROSS/BLUE SHIELD

## 2015-01-21 ENCOUNTER — Encounter: Payer: Self-pay | Admitting: Nurse Practitioner

## 2015-01-21 VITALS — BP 106/55 | HR 78 | Temp 97.5°F | Resp 18 | Ht 61.0 in | Wt 128.6 lb

## 2015-01-21 DIAGNOSIS — Z803 Family history of malignant neoplasm of breast: Secondary | ICD-10-CM

## 2015-01-21 DIAGNOSIS — C50311 Malignant neoplasm of lower-inner quadrant of right female breast: Secondary | ICD-10-CM

## 2015-01-21 DIAGNOSIS — C50511 Malignant neoplasm of lower-outer quadrant of right female breast: Secondary | ICD-10-CM

## 2015-01-21 DIAGNOSIS — K900 Celiac disease: Secondary | ICD-10-CM

## 2015-01-21 DIAGNOSIS — E039 Hypothyroidism, unspecified: Secondary | ICD-10-CM

## 2015-01-21 DIAGNOSIS — Z5111 Encounter for antineoplastic chemotherapy: Secondary | ICD-10-CM

## 2015-01-21 LAB — COMPREHENSIVE METABOLIC PANEL (CC13)
ALK PHOS: 95 U/L (ref 40–150)
ALT: 41 U/L (ref 0–55)
ANION GAP: 8 meq/L (ref 3–11)
AST: 30 U/L (ref 5–34)
Albumin: 3.6 g/dL (ref 3.5–5.0)
BUN: 9.3 mg/dL (ref 7.0–26.0)
CHLORIDE: 107 meq/L (ref 98–109)
CO2: 27 meq/L (ref 22–29)
Calcium: 8.9 mg/dL (ref 8.4–10.4)
Creatinine: 0.6 mg/dL (ref 0.6–1.1)
GLUCOSE: 78 mg/dL (ref 70–140)
POTASSIUM: 4.5 meq/L (ref 3.5–5.1)
Sodium: 142 mEq/L (ref 136–145)
TOTAL PROTEIN: 5.9 g/dL — AB (ref 6.4–8.3)
Total Bilirubin: 0.27 mg/dL (ref 0.20–1.20)

## 2015-01-21 LAB — CBC WITH DIFFERENTIAL/PLATELET
BASO%: 0.8 % (ref 0.0–2.0)
BASOS ABS: 0 10*3/uL (ref 0.0–0.1)
EOS%: 1.8 % (ref 0.0–7.0)
Eosinophils Absolute: 0.1 10*3/uL (ref 0.0–0.5)
HCT: 31 % — ABNORMAL LOW (ref 34.8–46.6)
HGB: 10.3 g/dL — ABNORMAL LOW (ref 11.6–15.9)
LYMPH#: 0.5 10*3/uL — AB (ref 0.9–3.3)
LYMPH%: 9.3 % — AB (ref 14.0–49.7)
MCH: 32.3 pg (ref 25.1–34.0)
MCHC: 33.2 g/dL (ref 31.5–36.0)
MCV: 97.4 fL (ref 79.5–101.0)
MONO#: 0.6 10*3/uL (ref 0.1–0.9)
MONO%: 10.6 % (ref 0.0–14.0)
NEUT#: 4.5 10*3/uL (ref 1.5–6.5)
NEUT%: 77.5 % — ABNORMAL HIGH (ref 38.4–76.8)
PLATELETS: 188 10*3/uL (ref 145–400)
RBC: 3.18 10*6/uL — AB (ref 3.70–5.45)
RDW: 19.7 % — AB (ref 11.2–14.5)
WBC: 5.8 10*3/uL (ref 3.9–10.3)

## 2015-01-21 MED ORDER — FAMOTIDINE IN NACL 20-0.9 MG/50ML-% IV SOLN
INTRAVENOUS | Status: AC
  Filled 2015-01-21: qty 50

## 2015-01-21 MED ORDER — ONDANSETRON 8 MG/50ML IVPB (CHCC)
8.0000 mg | Freq: Once | INTRAVENOUS | Status: AC
  Administered 2015-01-21: 8 mg via INTRAVENOUS

## 2015-01-21 MED ORDER — DIPHENHYDRAMINE HCL 50 MG/ML IJ SOLN
INTRAMUSCULAR | Status: AC
  Filled 2015-01-21: qty 1

## 2015-01-21 MED ORDER — ONDANSETRON 8 MG/NS 50 ML IVPB
INTRAVENOUS | Status: AC
  Filled 2015-01-21: qty 8

## 2015-01-21 MED ORDER — FAMOTIDINE IN NACL 20-0.9 MG/50ML-% IV SOLN
20.0000 mg | Freq: Once | INTRAVENOUS | Status: AC
  Administered 2015-01-21: 20 mg via INTRAVENOUS

## 2015-01-21 MED ORDER — LORAZEPAM 2 MG/ML IJ SOLN
0.5000 mg | Freq: Once | INTRAMUSCULAR | Status: AC
  Administered 2015-01-21: 0.5 mg via INTRAVENOUS

## 2015-01-21 MED ORDER — SODIUM CHLORIDE 0.9 % IJ SOLN
10.0000 mL | INTRAMUSCULAR | Status: DC | PRN
  Administered 2015-01-21: 10 mL
  Filled 2015-01-21: qty 10

## 2015-01-21 MED ORDER — SODIUM CHLORIDE 0.9 % IV SOLN
Freq: Once | INTRAVENOUS | Status: AC
  Administered 2015-01-21: 10:00:00 via INTRAVENOUS

## 2015-01-21 MED ORDER — PACLITAXEL CHEMO INJECTION 300 MG/50ML
80.0000 mg/m2 | Freq: Once | INTRAVENOUS | Status: AC
  Administered 2015-01-21: 126 mg via INTRAVENOUS
  Filled 2015-01-21: qty 21

## 2015-01-21 MED ORDER — DEXAMETHASONE SODIUM PHOSPHATE 10 MG/ML IJ SOLN
4.0000 mg | Freq: Once | INTRAMUSCULAR | Status: AC
  Administered 2015-01-21: 4 mg via INTRAVENOUS

## 2015-01-21 MED ORDER — LORAZEPAM 2 MG/ML IJ SOLN
INTRAMUSCULAR | Status: AC
  Filled 2015-01-21: qty 1

## 2015-01-21 MED ORDER — HEPARIN SOD (PORK) LOCK FLUSH 100 UNIT/ML IV SOLN
500.0000 [IU] | Freq: Once | INTRAVENOUS | Status: AC | PRN
  Administered 2015-01-21: 500 [IU]
  Filled 2015-01-21: qty 5

## 2015-01-21 MED ORDER — DIPHENHYDRAMINE HCL 50 MG/ML IJ SOLN
25.0000 mg | Freq: Once | INTRAMUSCULAR | Status: AC
  Administered 2015-01-21: 25 mg via INTRAVENOUS

## 2015-01-21 MED ORDER — DEXAMETHASONE SODIUM PHOSPHATE 10 MG/ML IJ SOLN
INTRAMUSCULAR | Status: AC
  Filled 2015-01-21: qty 1

## 2015-01-21 NOTE — Patient Instructions (Signed)
[facility] Discharge Instructions for Patients Receiving Chemotherapy  Today you received the following chemotherapy agents Taxol To help prevent nausea and vomiting after your treatment, we encourage you to take your nausea medication as prescribed.  If you develop nausea and vomiting that is not controlled by your nausea medication, call the clinic.   BELOW ARE SYMPTOMS THAT SHOULD BE REPORTED IMMEDIATELY:  *FEVER GREATER THAN 100.5 F  *CHILLS WITH OR WITHOUT FEVER  NAUSEA AND VOMITING THAT IS NOT CONTROLLED WITH YOUR NAUSEA MEDICATION  *UNUSUAL SHORTNESS OF BREATH  *UNUSUAL BRUISING OR BLEEDING  TENDERNESS IN MOUTH AND THROAT WITH OR WITHOUT PRESENCE OF ULCERS  *URINARY PROBLEMS  *BOWEL PROBLEMS  UNUSUAL RASH Items with * indicate a potential emergency and should be followed up as soon as possible.  Feel free to call the clinic you have any questions or concerns. The clinic phone number is [phone].    Paclitaxel injection What is this medicine? PACLITAXEL (PAK li TAX el) is a chemotherapy drug. It targets fast dividing cells, like cancer cells, and causes these cells to die. This medicine is used to treat ovarian cancer, breast cancer, and other cancers. This medicine may be used for other purposes; ask your health care provider or pharmacist if you have questions. COMMON BRAND NAME(S): Onxol, Taxol What should I tell my health care provider before I take this medicine? They need to know if you have any of these conditions: -blood disorders -irregular heartbeat -infection (especially a virus infection such as chickenpox, cold sores, or herpes) -liver disease -previous or ongoing radiation therapy -an unusual or allergic reaction to paclitaxel, alcohol, polyoxyethylated castor oil, other chemotherapy agents, other medicines, foods, dyes, or preservatives -pregnant or trying to get pregnant -breast-feeding How should I use this medicine? This  drug is given as an infusion into a vein. It is administered in a hospital or clinic by a specially trained health care professional. Talk to your pediatrician regarding the use of this medicine in children. Special care may be needed. Overdosage: If you think you have taken too much of this medicine contact a poison control center or emergency room at once. NOTE: This medicine is only for you. Do not share this medicine with others. What if I miss a dose? It is important not to miss your dose. Call your doctor or health care professional if you are unable to keep an appointment. What may interact with this medicine? Do not take this medicine with any of the following medications: -disulfiram -metronidazole This medicine may also interact with the following medications: -cyclosporine -diazepam -ketoconazole -medicines to increase blood counts like filgrastim, pegfilgrastim, sargramostim -other chemotherapy drugs like cisplatin, doxorubicin, epirubicin, etoposide, teniposide, vincristine -quinidine -testosterone -vaccines -verapamil Talk to your doctor or health care professional before taking any of these medicines: -acetaminophen -aspirin -ibuprofen -ketoprofen -naproxen This list may not describe all possible interactions. Give your health care provider a list of all the medicines, herbs, non-prescription drugs, or dietary supplements you use. Also tell them if you smoke, drink alcohol, or use illegal drugs. Some items may interact with your medicine. What should I watch for while using this medicine? Your condition will be monitored carefully while you are receiving this medicine. You will need important blood work done while you are taking this medicine. This drug may make you feel generally unwell. This is not uncommon, as chemotherapy can affect healthy cells as well as cancer cells. Report any side effects. Continue your course of treatment even  though you feel ill unless your  doctor tells you to stop. In some cases, you may be given additional medicines to help with side effects. Follow all directions for their use. Call your doctor or health care professional for advice if you get a fever, chills or sore throat, or other symptoms of a cold or flu. Do not treat yourself. This drug decreases your body's ability to fight infections. Try to avoid being around people who are sick. This medicine may increase your risk to bruise or bleed. Call your doctor or health care professional if you notice any unusual bleeding. Be careful brushing and flossing your teeth or using a toothpick because you may get an infection or bleed more easily. If you have any dental work done, tell your dentist you are receiving this medicine. Avoid taking products that contain aspirin, acetaminophen, ibuprofen, naproxen, or ketoprofen unless instructed by your doctor. These medicines may hide a fever. Do not become pregnant while taking this medicine. Women should inform their doctor if they wish to become pregnant or think they might be pregnant. There is a potential for serious side effects to an unborn child. Talk to your health care professional or pharmacist for more information. Do not breast-feed an infant while taking this medicine. Men are advised not to father a child while receiving this medicine. What side effects may I notice from receiving this medicine? Side effects that you should report to your doctor or health care professional as soon as possible: -allergic reactions like skin rash, itching or hives, swelling of the face, lips, or tongue -low blood counts - This drug may decrease the number of white blood cells, red blood cells and platelets. You may be at increased risk for infections and bleeding. -signs of infection - fever or chills, cough, sore throat, pain or difficulty passing urine -signs of decreased platelets or bleeding - bruising, pinpoint red spots on the skin, black,  tarry stools, nosebleeds -signs of decreased red blood cells - unusually weak or tired, fainting spells, lightheadedness -breathing problems -chest pain -high or low blood pressure -mouth sores -nausea and vomiting -pain, swelling, redness or irritation at the injection site -pain, tingling, numbness in the hands or feet -slow or irregular heartbeat -swelling of the ankle, feet, hands Side effects that usually do not require medical attention (report to your doctor or health care professional if they continue or are bothersome): -bone pain -complete hair loss including hair on your head, underarms, pubic hair, eyebrows, and eyelashes -changes in the color of fingernails -diarrhea -loosening of the fingernails -loss of appetite -muscle or joint pain -red flush to skin -sweating This list may not describe all possible side effects. Call your doctor for medical advice about side effects. You may report side effects to FDA at 1-800-FDA-1088. Where should I keep my medicine? This drug is given in a hospital or clinic and will not be stored at home. NOTE: This sheet is a summary. It may not cover all possible information. If you have questions about this medicine, talk to your doctor, pharmacist, or health care provider.  2015, Elsevier/Gold Standard. (2013-02-02 16:41:21)

## 2015-01-21 NOTE — Progress Notes (Signed)
[place]  [phone] [phone]     ID: Misty [name] DOB: 01-19-1976  MR#: [id number]  OIB#:[id number]  Patient Care Team: Glenda Chroman, MD as PCP - General (Internal Medicine) Fanny Skates, MD as Consulting Physician (General Surgery) Chauncey Cruel, MD as Consulting Physician (Oncology) PCP: Glenda Chroman., MD Santo Held, MD (GYN) Crissie Reese M.D. (Plastics)  CHIEF COMPLAINT: Estrogen receptor positive breast cancer CURRENT TREATMENT: adjuvant chemotherapy    BREAST CANCER HISTORY: From the original intake note:  Misty [name] was closely followed for some right breast changes between 20 11/12/2012. Everything seemed stable at that time. In 2014 she was promoted in her job, was working very hard, and actually missed having a mammogram. Late in January 2015 she noted some pain in her right breast and by February when she lifted her arms she noted that the nipple was tilting sideways and seemed a little bit of gathered. She didn't think much of this however. It was not until July that she noted a palpable mass in the lateral right breast. She brought this to her gynecologist's attention and she was set up for diagnostic mammography at [facility] 08/04/2014.   There was an area of distortion in the lateral aspect of the right breast noted on mammography,  corresponding to a palpable firm mass at the 8:00 position of the right breast. Ultrasound confirmed a 2.4 cm irregular hypoechoic spiculated mass in the right breast at the 8:00 position and in addition at the 5:00 position 1 cm from the nipple there was a taller than wide irregular hypoechoic mass measuring 0.8 cm. There was no right axillary adenopathy. In the left breast upper-outer quadrant there was a minimally complicated cyst measuring 0.7 cm.  On 08/11/2014 the patient underwent biopsy of the 2 right breast masses noted on ultrasound. Both showed invasive ductal carcinoma, grade 1,  estrogen receptor 90% positive, progesterone receptor 90% positive, both with strong staining intensity, HER-2 equivocal at 2+ but negative by FISH, with the signals ratio being 1.20 and the number per nucleus 2.1. The proliferation fraction by MIB-1 was 17% ([id number]; this report was reviewed by our pathologist, who concurred, [name] [phone]).  On 08/25/2014 the patient underwent bilateral breast MRI at [place]. This showed, in the right breast, an irregular enhancing mass at the 8:00 position measuring 2.7 cm. Also in the right breast at the 6:00 position there was another enhancing mass measuring 0.6 cm, located 2 cm away from the larger mass. There were also masses in the retroareolar position measuring 0.7 cm, in the posterior central right breast measuring 0.5 cm, and another one just inferior to the midline measuring 5.5 cm. There were no masses or findings of concern in the left breast and no abnormal appearing lymph nodes.  The patient's subsequent history is as detailed below  INTERVAL HISTORY: Misty [name] returns today for follow up of her breast cancer, accompanied by her mother. She is to begin weekly paclitaxel x 12 today. She was supposed to begin last week, but this dose was held because of inclement weather.  REVIEW OF SYSTEMS: Misty [name] denies pain, fevers, chills, nausea, vomiting, or changes in bowel or bladder habits. Her appetite has improved and she is now up to 129lb, which is the most she has ever weighed (not including pregnancy). She denies mouth sores, rashes, or peripheral neuropathy symptoms. She has no shortness of breath, chest pain, cough, or palpitations. Her fatigue is manageable, but she has been sleeping better. A  detailed review of systems is otherwise negative.   PAST MEDICAL HISTORY: Past Medical History  Diagnosis Date  . Thyroid disease   . Celiac disease   . PONV (postoperative nausea and vomiting)   . Hypothyroidism   . Anxiety   . Headache(784.0)     . Cancer     breast cancer    PAST SURGICAL HISTORY: Past Surgical History  Procedure Laterality Date  . Cesarean section    . Gallbladder surgery    . Cholecystectomy    . Simple mastectomy with axillary sentinel node biopsy Right 09/28/2014    Procedure: RIGHT TOTAL MASTECTOMY, RIGHT  AXILLARY SENTINEL NODE BIOPSY;  Surgeon: Fanny Skates, MD;  Location: [place];  Service: General;  Laterality: Right;  . Portacath placement Right 10/22/2014    Procedure: INSERTION PORT-A-CATH;  Surgeon: Fanny Skates, MD;  Location: [place];  Service: General;  Laterality: Right;    FAMILY HISTORY Family History  Problem Relation Age of Onset  . Skin cancer Father   . Breast cancer Maternal Grandmother     dx unknown age  . Breast cancer Other 39    mat great aunt through [facility] with breast cancer   the patient's parents are both living, in their late 10s. The patient had one brother, no sisters. The patient's maternal grandmother was diagnosed with breast cancer in her late 56s. One of that grandmother's sisters was also diagnosed with breast cancer, in her 62s. There is no other history of breast or ovarian cancer in the family  GYNECOLOGIC HISTORY:  No LMP recorded. Menarche age 43, first live birth age 55, the patient is GX P1. She is still having regular periods. 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 Her husband, [name], works as a Engineer, structural in [place]. Their son [name] is 57. The patient attends a local [place]: Not in place   HEALTH MAINTENANCE: History  Substance Use Topics  . Smoking status: Never Smoker   . Smokeless tobacco: Not on file  . Alcohol Use: No     Colonoscopy:  PAP: 07/29/2014  Bone density:  Lipid panel:  Allergies  Allergen  Reactions  . Gluten Meal   . Penicillins Nausea And Vomiting    Current Outpatient Prescriptions  Medication Sig Dispense Refill  . levothyroxine (SYNTHROID) 100 MCG tablet Take 1 tablet (100 mcg total) by mouth daily before breakfast. 30 tablet 6  . HYDROcodone-acetaminophen (NORCO) 5-325 MG per tablet Take 1-2 tablets by mouth every 6 (six) hours as needed for moderate pain or severe pain. (Patient not taking: Reported on 12/08/2014) 30 tablet 0  . LORazepam (ATIVAN) 0.5 MG tablet Take 1 tablet (0.5 mg total) by mouth every 6 (six) hours as needed (Nausea or vomiting). (Patient not taking: Reported on 01/21/2015) 30 tablet 0  . ondansetron (ZOFRAN) 8 MG tablet Take 1 tablet (8 mg total) by mouth 2 (two) times daily. Start the day after chemo for 2 days. Then take as needed for nausea or vomiting. (Patient not taking: Reported on 01/06/2015) 30 tablet 1  . prochlorperazine (COMPAZINE) 10 MG tablet Take 1 tablet (10 mg total) by mouth every 6 (six) hours as needed (Nausea or vomiting). (Patient not taking: Reported on 01/21/2015) 30 tablet 1   No current facility-administered medications for this visit.  OBJECTIVE: Middle-aged white woman in no acute distress Filed Vitals:   01/21/15 0927  BP: 106/55  Pulse: 78  Temp: 97.5 F (36.4 C)  Resp: 18     Body mass index is 24.31 kg/(m^2).    ECOG FS:1 - Symptomatic but completely ambulatory  Sclerae unicteric, pupils equal and reactive Oropharynx clear and moist-- no thrush No cervical or supraclavicular adenopathy Lungs no rales or rhonchi Heart regular rate and rhythm Abd soft, nontender, positive bowel sounds MSK no focal spinal tenderness, no upper extremity lymphedema Neuro: nonfocal, well oriented, appropriate affect Breasts: deferred  LAB RESULTS:  CMP     Component Value Date/Time   NA 142 01/21/2015 0909   NA 142 09/23/2014 1456   K 4.5 01/21/2015 0909   K 3.9 09/23/2014 1456   CL 104 09/23/2014 1456   CO2 27  01/21/2015 0909   CO2 27 09/23/2014 1456   GLUCOSE 78 01/21/2015 0909   GLUCOSE 71 09/23/2014 1456   BUN 9.3 01/21/2015 0909   BUN 11 09/23/2014 1456   CREATININE 0.6 01/21/2015 0909   CREATININE 0.70 09/23/2014 1456   CALCIUM 8.9 01/21/2015 0909   CALCIUM 9.8 09/23/2014 1456   PROT 5.9* 01/21/2015 0909   PROT 7.7 09/23/2014 1456   ALBUMIN 3.6 01/21/2015 0909   ALBUMIN 4.0 09/23/2014 1456   AST 30 01/21/2015 0909   AST 19 09/23/2014 1456   ALT 41 01/21/2015 0909   ALT 12 09/23/2014 1456   ALKPHOS 95 01/21/2015 0909   ALKPHOS 60 09/23/2014 1456   BILITOT 0.27 01/21/2015 0909   BILITOT 0.5 09/23/2014 1456   GFRNONAA >90 09/23/2014 1456   GFRAA >90 09/23/2014 1456    I No results found for: SPEP  Lab Results  Component Value Date   WBC 5.8 01/21/2015   NEUTROABS 4.5 01/21/2015   HGB 10.3* 01/21/2015   HCT 31.0* 01/21/2015   MCV 97.4 01/21/2015   PLT 188 01/21/2015      Chemistry      Component Value Date/Time   NA 142 01/21/2015 0909   NA 142 09/23/2014 1456   K 4.5 01/21/2015 0909   K 3.9 09/23/2014 1456   CL 104 09/23/2014 1456   CO2 27 01/21/2015 0909   CO2 27 09/23/2014 1456   BUN 9.3 01/21/2015 0909   BUN 11 09/23/2014 1456   CREATININE 0.6 01/21/2015 0909   CREATININE 0.70 09/23/2014 1456      Component Value Date/Time   CALCIUM 8.9 01/21/2015 0909   CALCIUM 9.8 09/23/2014 1456   ALKPHOS 95 01/21/2015 0909   ALKPHOS 60 09/23/2014 1456   AST 30 01/21/2015 0909   AST 19 09/23/2014 1456   ALT 41 01/21/2015 0909   ALT 12 09/23/2014 1456   BILITOT 0.27 01/21/2015 0909   BILITOT 0.5 09/23/2014 1456       No results found for: LABCA2  No components found for: LABCA125  No results for input(s): INR in the last 168 hours.  Urinalysis No results found for: COLORURINE  STUDIES: Most recent echocardiogram on 10/27/14 showed an ejection fraction of 50-55%  No results found.  ASSESSMENT: 39 y.o. BRCA negative [place], [place] woman status post  biopsy of 2 separate right breast masses 08/11/2014, both positive for an invasive ductal carcinoma, grade 1, estrogen and progesterone receptor strongly positive, with an MIB-1 of 19%, and HER-2 nonamplified  (1) genetics testing sent 08/26/2014, showing no mutations in the BRCA genes; there were also no mutations in ATM, BARD 1, BRIP1, [facility]  1, CHE K2, MR E11A, M UT YH, N BN, NF1, PA L B2, PTE N, RAD 50, RAD 50 1C, RAD 50 1D, and T p53.  (2) status post right mastectomy with sentinel lymph node sampling 09/28/2014 for an mpT2 pN1, stage IIB invasive breast cancer (with both ductal and lobular features), grade 2, with negative margins  (3) adjuvant chemotherapy to consist of doxorubicin and cyclophosphamide in dose dense fashion 4, starting 11/11/2014,  to be followed by paclitaxel weekly x 12  (4) radiation to follow chemotherapy.  (5) anti-estrogens to follow radiation  (6) history of celiac disease  PLAN: Misty [name] looks and feels well today. The labs were reviewed in detail and were entirely stable. She will proceed with cycle 1 of paclitaxel as planned today. Tomorrow she will return for IV fluids.   We reviewed her antiemetic schedule again, as well as side effects/toxicities of paclitaxel. She is very concerned about the possibility of a hypersensitivity reaction. I assured her that this dose would be given slowly and we have a protocol already in place should this unfortunately occur.   Misty [name] will return next week for labs, an office visit, and cycle 2 of paclitaxel. She understands agrees with this plan. She knows the goal of treatment in her case is cure. She has been encouraged to call with any issues that might arise before her next visit here.   Misty Duster, NP   01/21/2015 9:51 AM

## 2015-01-22 ENCOUNTER — Ambulatory Visit (HOSPITAL_BASED_OUTPATIENT_CLINIC_OR_DEPARTMENT_OTHER): Payer: BLUE CROSS/BLUE SHIELD

## 2015-01-22 DIAGNOSIS — C50311 Malignant neoplasm of lower-inner quadrant of right female breast: Secondary | ICD-10-CM

## 2015-01-22 DIAGNOSIS — K900 Celiac disease: Secondary | ICD-10-CM

## 2015-01-22 DIAGNOSIS — E860 Dehydration: Secondary | ICD-10-CM

## 2015-01-22 DIAGNOSIS — C50511 Malignant neoplasm of lower-outer quadrant of right female breast: Secondary | ICD-10-CM

## 2015-01-22 DIAGNOSIS — Z803 Family history of malignant neoplasm of breast: Secondary | ICD-10-CM

## 2015-01-22 MED ORDER — SODIUM CHLORIDE 0.9 % IJ SOLN
10.0000 mL | INTRAMUSCULAR | Status: DC | PRN
  Administered 2015-01-22: 10 mL via INTRAVENOUS
  Filled 2015-01-22: qty 10

## 2015-01-22 MED ORDER — DEXTROSE-NACL 5-0.9 % IV SOLN
INTRAVENOUS | Status: DC
  Administered 2015-01-22: 08:00:00 via INTRAVENOUS

## 2015-01-22 MED ORDER — HEPARIN SOD (PORK) LOCK FLUSH 100 UNIT/ML IV SOLN
500.0000 [IU] | Freq: Once | INTRAVENOUS | Status: AC
  Administered 2015-01-22: 500 [IU] via INTRAVENOUS
  Filled 2015-01-22: qty 5

## 2015-01-26 ENCOUNTER — Telehealth: Payer: Self-pay

## 2015-01-26 NOTE — Telephone Encounter (Signed)
Better than 1st set of chemo she had. Had chest pain Sat. but no more, it went away. Able to eat and drink, no constipation. Told pt to mention to MD the chest pain before her next treatment. Call if any questions or concerns.

## 2015-01-27 ENCOUNTER — Ambulatory Visit: Payer: BC Managed Care – PPO | Admitting: Nurse Practitioner

## 2015-01-27 ENCOUNTER — Other Ambulatory Visit: Payer: BC Managed Care – PPO

## 2015-01-28 ENCOUNTER — Other Ambulatory Visit (HOSPITAL_BASED_OUTPATIENT_CLINIC_OR_DEPARTMENT_OTHER): Payer: BLUE CROSS/BLUE SHIELD

## 2015-01-28 ENCOUNTER — Ambulatory Visit (HOSPITAL_BASED_OUTPATIENT_CLINIC_OR_DEPARTMENT_OTHER): Payer: BLUE CROSS/BLUE SHIELD | Admitting: Nurse Practitioner

## 2015-01-28 ENCOUNTER — Other Ambulatory Visit: Payer: BLUE CROSS/BLUE SHIELD

## 2015-01-28 ENCOUNTER — Telehealth: Payer: Self-pay | Admitting: Nurse Practitioner

## 2015-01-28 ENCOUNTER — Encounter: Payer: Self-pay | Admitting: Nurse Practitioner

## 2015-01-28 ENCOUNTER — Other Ambulatory Visit: Payer: Self-pay | Admitting: Medical Oncology

## 2015-01-28 ENCOUNTER — Ambulatory Visit (HOSPITAL_BASED_OUTPATIENT_CLINIC_OR_DEPARTMENT_OTHER): Payer: BLUE CROSS/BLUE SHIELD

## 2015-01-28 VITALS — BP 106/67 | HR 79 | Temp 98.3°F | Resp 18 | Ht 61.0 in | Wt 129.2 lb

## 2015-01-28 DIAGNOSIS — Z803 Family history of malignant neoplasm of breast: Secondary | ICD-10-CM

## 2015-01-28 DIAGNOSIS — K900 Celiac disease: Secondary | ICD-10-CM

## 2015-01-28 DIAGNOSIS — C50511 Malignant neoplasm of lower-outer quadrant of right female breast: Secondary | ICD-10-CM

## 2015-01-28 DIAGNOSIS — Z170 Estrogen receptor positive status [ER+]: Secondary | ICD-10-CM

## 2015-01-28 DIAGNOSIS — C50311 Malignant neoplasm of lower-inner quadrant of right female breast: Secondary | ICD-10-CM

## 2015-01-28 DIAGNOSIS — Z5111 Encounter for antineoplastic chemotherapy: Secondary | ICD-10-CM

## 2015-01-28 LAB — CBC WITH DIFFERENTIAL/PLATELET
BASO%: 0.6 % (ref 0.0–2.0)
BASOS ABS: 0 10*3/uL (ref 0.0–0.1)
EOS%: 4.2 % (ref 0.0–7.0)
Eosinophils Absolute: 0.2 10*3/uL (ref 0.0–0.5)
HCT: 29 % — ABNORMAL LOW (ref 34.8–46.6)
HEMOGLOBIN: 10 g/dL — AB (ref 11.6–15.9)
LYMPH#: 0.6 10*3/uL — AB (ref 0.9–3.3)
LYMPH%: 10.6 % — AB (ref 14.0–49.7)
MCH: 33.4 pg (ref 25.1–34.0)
MCHC: 34.5 g/dL (ref 31.5–36.0)
MCV: 97 fL (ref 79.5–101.0)
MONO#: 0.3 10*3/uL (ref 0.1–0.9)
MONO%: 6 % (ref 0.0–14.0)
NEUT%: 78.6 % — ABNORMAL HIGH (ref 38.4–76.8)
NEUTROS ABS: 4.1 10*3/uL (ref 1.5–6.5)
PLATELETS: 213 10*3/uL (ref 145–400)
RBC: 2.99 10*6/uL — ABNORMAL LOW (ref 3.70–5.45)
RDW: 17 % — AB (ref 11.2–14.5)
WBC: 5.2 10*3/uL (ref 3.9–10.3)

## 2015-01-28 LAB — COMPREHENSIVE METABOLIC PANEL (CC13)
ALK PHOS: 88 U/L (ref 40–150)
ALT: 40 U/L (ref 0–55)
ANION GAP: 6 meq/L (ref 3–11)
AST: 30 U/L (ref 5–34)
Albumin: 3.7 g/dL (ref 3.5–5.0)
BUN: 13.5 mg/dL (ref 7.0–26.0)
CO2: 29 mEq/L (ref 22–29)
Calcium: 9.4 mg/dL (ref 8.4–10.4)
Chloride: 108 mEq/L (ref 98–109)
Creatinine: 0.7 mg/dL (ref 0.6–1.1)
Glucose: 82 mg/dl (ref 70–140)
Potassium: 4.6 mEq/L (ref 3.5–5.1)
Sodium: 143 mEq/L (ref 136–145)
TOTAL PROTEIN: 6 g/dL — AB (ref 6.4–8.3)
Total Bilirubin: 0.36 mg/dL (ref 0.20–1.20)

## 2015-01-28 MED ORDER — SODIUM CHLORIDE 0.9 % IJ SOLN
10.0000 mL | INTRAMUSCULAR | Status: DC | PRN
  Administered 2015-01-28: 10 mL
  Filled 2015-01-28: qty 10

## 2015-01-28 MED ORDER — DEXAMETHASONE SODIUM PHOSPHATE 10 MG/ML IJ SOLN
10.0000 mg | Freq: Once | INTRAMUSCULAR | Status: AC
  Administered 2015-01-28: 4 mg via INTRAVENOUS

## 2015-01-28 MED ORDER — ONDANSETRON 8 MG/50ML IVPB (CHCC)
8.0000 mg | Freq: Once | INTRAVENOUS | Status: AC
  Administered 2015-01-28: 8 mg via INTRAVENOUS

## 2015-01-28 MED ORDER — DEXAMETHASONE SODIUM PHOSPHATE 10 MG/ML IJ SOLN
INTRAMUSCULAR | Status: AC
  Filled 2015-01-28: qty 1

## 2015-01-28 MED ORDER — HEPARIN SOD (PORK) LOCK FLUSH 100 UNIT/ML IV SOLN
500.0000 [IU] | Freq: Once | INTRAVENOUS | Status: AC | PRN
  Administered 2015-01-28: 500 [IU]
  Filled 2015-01-28: qty 5

## 2015-01-28 MED ORDER — DIPHENHYDRAMINE HCL 50 MG/ML IJ SOLN
INTRAMUSCULAR | Status: AC
  Filled 2015-01-28: qty 1

## 2015-01-28 MED ORDER — DIPHENHYDRAMINE HCL 50 MG/ML IJ SOLN
25.0000 mg | Freq: Once | INTRAMUSCULAR | Status: AC
  Administered 2015-01-28: 25 mg via INTRAVENOUS

## 2015-01-28 MED ORDER — FAMOTIDINE IN NACL 20-0.9 MG/50ML-% IV SOLN
20.0000 mg | Freq: Once | INTRAVENOUS | Status: AC
  Administered 2015-01-28: 20 mg via INTRAVENOUS

## 2015-01-28 MED ORDER — FAMOTIDINE IN NACL 20-0.9 MG/50ML-% IV SOLN
INTRAVENOUS | Status: AC
  Filled 2015-01-28: qty 50

## 2015-01-28 MED ORDER — LORAZEPAM 2 MG/ML IJ SOLN
0.5000 mg | Freq: Once | INTRAMUSCULAR | Status: AC
  Administered 2015-01-28: 0.5 mg via INTRAVENOUS

## 2015-01-28 MED ORDER — ONDANSETRON 8 MG/NS 50 ML IVPB
INTRAVENOUS | Status: AC
  Filled 2015-01-28: qty 8

## 2015-01-28 MED ORDER — PACLITAXEL CHEMO INJECTION 300 MG/50ML
80.0000 mg/m2 | Freq: Once | INTRAVENOUS | Status: AC
  Administered 2015-01-28: 126 mg via INTRAVENOUS
  Filled 2015-01-28: qty 21

## 2015-01-28 MED ORDER — LORAZEPAM 2 MG/ML IJ SOLN
INTRAMUSCULAR | Status: AC
  Filled 2015-01-28: qty 1

## 2015-01-28 MED ORDER — SODIUM CHLORIDE 0.9 % IV SOLN
Freq: Once | INTRAVENOUS | Status: AC
  Administered 2015-01-28: 10:00:00 via INTRAVENOUS

## 2015-01-28 NOTE — Patient Instructions (Signed)
[facility] Discharge Instructions for Patients Receiving Chemotherapy  Today you received the following chemotherapy agents:  Taxol  To help prevent nausea and vomiting after your treatment, we encourage you to take your nausea medication as ordered per MD.   If you develop nausea and vomiting that is not controlled by your nausea medication, call the clinic.   BELOW ARE SYMPTOMS THAT SHOULD BE REPORTED IMMEDIATELY:  *FEVER GREATER THAN 100.5 F  *CHILLS WITH OR WITHOUT FEVER  NAUSEA AND VOMITING THAT IS NOT CONTROLLED WITH YOUR NAUSEA MEDICATION  *UNUSUAL SHORTNESS OF BREATH  *UNUSUAL BRUISING OR BLEEDING  TENDERNESS IN MOUTH AND THROAT WITH OR WITHOUT PRESENCE OF ULCERS  *URINARY PROBLEMS  *BOWEL PROBLEMS  UNUSUAL RASH Items with * indicate a potential emergency and should be followed up as soon as possible.  Feel free to call the clinic you have any questions or concerns. The clinic phone number is [phone].    

## 2015-01-28 NOTE — Progress Notes (Signed)
[name]  Telephone:([phone]) [phone] Fax:(336) [phone]     ID: Misty [name] DOB: Sep 01, 1976  MR#: [id number]  FUX#:[id number]  Patient Care Team: Glenda Chroman, MD as PCP - General (Internal Medicine) Fanny Skates, MD as Consulting Physician (General Surgery) Chauncey Cruel, MD as Consulting Physician (Oncology) PCP: Glenda Chroman., MD Santo Held, MD (GYN) Crissie Reese M.D. (Plastics)  CHIEF COMPLAINT: Estrogen receptor positive breast cancer CURRENT TREATMENT: adjuvant chemotherapy    BREAST CANCER HISTORY: From the original intake note:  Misty [name] was closely followed for some right breast changes between 20 11/12/2012. Everything seemed stable at that time. In 2014 she was promoted in her job, was working very hard, and actually missed having a mammogram. Late in January 2015 she noted some pain in her right breast and by February when she lifted her arms she noted that the nipple was tilting sideways and seemed a little bit of gathered. She didn't think much of this however. It was not until July that she noted a palpable mass in the lateral right breast. She brought this to her gynecologist's attention and she was set up for diagnostic mammography at [facility] 08/04/2014.   There was an area of distortion in the lateral aspect of the right breast noted on mammography,  corresponding to a palpable firm mass at the 8:00 position of the right breast. Ultrasound confirmed a 2.4 cm irregular hypoechoic spiculated mass in the right breast at the 8:00 position and in addition at the 5:00 position 1 cm from the nipple there was a taller than wide irregular hypoechoic mass measuring 0.8 cm. There was no right axillary adenopathy. In the left breast upper-outer quadrant there was a minimally complicated cyst measuring 0.7 cm.  On 08/11/2014 the patient underwent biopsy of the 2 right breast masses noted on ultrasound. Both showed invasive ductal carcinoma, grade 1,  estrogen receptor 90% positive, progesterone receptor 90% positive, both with strong staining intensity, HER-2 equivocal at 2+ but negative by FISH, with the signals ratio being 1.20 and the number per nucleus 2.1. The proliferation fraction by MIB-1 was 17% ([id number]; this report was reviewed by our pathologist, who concurred, [name] [phone]).  On 08/25/2014 the patient underwent bilateral breast MRI at [place]. This showed, in the right breast, an irregular enhancing mass at the 8:00 position measuring 2.7 cm. Also in the right breast at the 6:00 position there was another enhancing mass measuring 0.6 cm, located 2 cm away from the larger mass. There were also masses in the retroareolar position measuring 0.7 cm, in the posterior central right breast measuring 0.5 cm, and another one just inferior to the midline measuring 5.5 cm. There were no masses or findings of concern in the left breast and no abnormal appearing lymph nodes.  The patient's subsequent history is as detailed below  INTERVAL HISTORY: Misty [name] returns today for follow up of her breast cancer, accompanied by her mother. Today she is due for cycle 2 of paclitaxel. Overall she did will with her first round. She finished the infusion without any reactions. She had some chest pain for about 1hr after receiving IV fluids on Saturday, but has not had any since. She felt fleeting tingling to 3 of her left fingers, but this resolved on its own.   REVIEW OF SYSTEMS: Ayrabella denies pain, fevers, chills, nausea, vomiting, or changes in bowel or bladder habits. She is eating and drinking well. She denies mouth sores or rashes. She has no shortness of  breath, chest pain, cough, or palpitations. Her energy level is fair. A detailed review of systems is otherwise negative.   PAST MEDICAL HISTORY: Past Medical History  Diagnosis Date  . Thyroid disease   . Celiac disease   . PONV (postoperative nausea and vomiting)   . Hypothyroidism   .  Anxiety   . Headache(784.0)   . Cancer     breast cancer    PAST SURGICAL HISTORY: Past Surgical History  Procedure Laterality Date  . Cesarean section    . Gallbladder surgery    . Cholecystectomy    . Simple mastectomy with axillary sentinel node biopsy Right 09/28/2014    Procedure: RIGHT TOTAL MASTECTOMY, RIGHT  AXILLARY SENTINEL NODE BIOPSY;  Surgeon: Fanny Skates, MD;  Location: [place];  Service: General;  Laterality: Right;  . Portacath placement Right 10/22/2014    Procedure: INSERTION PORT-A-CATH;  Surgeon: Fanny Skates, MD;  Location: [place];  Service: General;  Laterality: Right;    FAMILY HISTORY Family History  Problem Relation Age of Onset  . Skin cancer Father   . Breast cancer Maternal Grandmother     dx unknown age  . Breast cancer Other 79    mat great aunt through [facility] with breast cancer   the patient's parents are both living, in their late 56s. The patient had one brother, no sisters. The patient's maternal grandmother was diagnosed with breast cancer in her late 61s. One of that grandmother's sisters was also diagnosed with breast cancer, in her 70s. There is no other history of breast or ovarian cancer in the family  GYNECOLOGIC HISTORY:  No LMP recorded. Menarche age 15, first live birth age 16, the patient is GX P1. She is still having regular periods. 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 Her husband, [name], works as a Engineer, structural in [place]. Their son [name] is 74. The patient attends a local [place]: Not in place   HEALTH MAINTENANCE: History  Substance Use Topics  . Smoking status: Never Smoker   . Smokeless tobacco: Not on file  . Alcohol Use: No     Colonoscopy:  PAP: 07/29/2014  Bone density:  Lipid  panel:  Allergies  Allergen Reactions  . Gluten Meal   . Penicillins Nausea And Vomiting    Current Outpatient Prescriptions  Medication Sig Dispense Refill  . levothyroxine (SYNTHROID) 100 MCG tablet Take 1 tablet (100 mcg total) by mouth daily before breakfast. 30 tablet 6  . prochlorperazine (COMPAZINE) 10 MG tablet Take 1 tablet (10 mg total) by mouth every 6 (six) hours as needed (Nausea or vomiting). 30 tablet 1  . HYDROcodone-acetaminophen (NORCO) 5-325 MG per tablet Take 1-2 tablets by mouth every 6 (six) hours as needed for moderate pain or severe pain. (Patient not taking: Reported on 12/08/2014) 30 tablet 0  . LORazepam (ATIVAN) 0.5 MG tablet Take 1 tablet (0.5 mg total) by mouth every 6 (six) hours as needed (Nausea or vomiting). (Patient not taking: Reported on 01/21/2015) 30 tablet 0  . ondansetron (ZOFRAN) 8 MG tablet Take 1 tablet (8 mg total) by mouth 2 (two) times daily. Start the day after chemo for 2 days. Then take as needed for nausea or vomiting. (Patient not taking: Reported on 01/06/2015) 30 tablet 1   No current facility-administered medications  for this visit.    OBJECTIVE: Middle-aged white woman in no acute distress Filed Vitals:   01/28/15 0904  BP: 106/67  Pulse: 79  Temp: 98.3 F (36.8 C)  Resp: 18     Body mass index is 24.42 kg/(m^2).    ECOG FS:1 - Symptomatic but completely ambulatory  Skin: warm, dry  HEENT: sclerae anicteric, conjunctivae pink, oropharynx clear. No thrush or mucositis.  Lymph Nodes: No cervical or supraclavicular lymphadenopathy  Lungs: clear to auscultation bilaterally, no rales, wheezes, or rhonci  Heart: regular rate and rhythm  Abdomen: round, soft, non tender, positive bowel sounds  Musculoskeletal: No focal spinal tenderness, no peripheral edema  Neuro: non focal, well oriented, positive affect  Breast: deferred  LAB RESULTS:  CMP     Component Value Date/Time   NA 143 01/28/2015 0850   NA 142 09/23/2014 1456   K  4.6 01/28/2015 0850   K 3.9 09/23/2014 1456   CL 104 09/23/2014 1456   CO2 29 01/28/2015 0850   CO2 27 09/23/2014 1456   GLUCOSE 82 01/28/2015 0850   GLUCOSE 71 09/23/2014 1456   BUN 13.5 01/28/2015 0850   BUN 11 09/23/2014 1456   CREATININE 0.7 01/28/2015 0850   CREATININE 0.70 09/23/2014 1456   CALCIUM 9.4 01/28/2015 0850   CALCIUM 9.8 09/23/2014 1456   PROT 6.0* 01/28/2015 0850   PROT 7.7 09/23/2014 1456   ALBUMIN 3.7 01/28/2015 0850   ALBUMIN 4.0 09/23/2014 1456   AST 30 01/28/2015 0850   AST 19 09/23/2014 1456   ALT 40 01/28/2015 0850   ALT 12 09/23/2014 1456   ALKPHOS 88 01/28/2015 0850   ALKPHOS 60 09/23/2014 1456   BILITOT 0.36 01/28/2015 0850   BILITOT 0.5 09/23/2014 1456   GFRNONAA >90 09/23/2014 1456   GFRAA >90 09/23/2014 1456    I No results found for: SPEP  Lab Results  Component Value Date   WBC 5.2 01/28/2015   NEUTROABS 4.1 01/28/2015   HGB 10.0* 01/28/2015   HCT 29.0* 01/28/2015   MCV 97.0 01/28/2015   PLT 213 01/28/2015      Chemistry      Component Value Date/Time   NA 143 01/28/2015 0850   NA 142 09/23/2014 1456   K 4.6 01/28/2015 0850   K 3.9 09/23/2014 1456   CL 104 09/23/2014 1456   CO2 29 01/28/2015 0850   CO2 27 09/23/2014 1456   BUN 13.5 01/28/2015 0850   BUN 11 09/23/2014 1456   CREATININE 0.7 01/28/2015 0850   CREATININE 0.70 09/23/2014 1456      Component Value Date/Time   CALCIUM 9.4 01/28/2015 0850   CALCIUM 9.8 09/23/2014 1456   ALKPHOS 88 01/28/2015 0850   ALKPHOS 60 09/23/2014 1456   AST 30 01/28/2015 0850   AST 19 09/23/2014 1456   ALT 40 01/28/2015 0850   ALT 12 09/23/2014 1456   BILITOT 0.36 01/28/2015 0850   BILITOT 0.5 09/23/2014 1456       No results found for: LABCA2  No components found for: LABCA125  No results for input(s): INR in the last 168 hours.  Urinalysis No results found for: COLORURINE  STUDIES: Most recent echocardiogram on 10/27/14 showed an ejection fraction of 50-55%  No  results found.  ASSESSMENT: 39 y.o. BRCA negative [place], [place] woman status post biopsy of 2 separate right breast masses 08/11/2014, both positive for an invasive ductal carcinoma, grade 1, estrogen and progesterone receptor strongly positive, with an MIB-1 of 19%, and HER-2  nonamplified  (1) genetics testing sent 08/26/2014, showing no mutations in the BRCA genes; there were also no mutations in ATM, BARD 1, BRIP1, CDH 1, CHE K2, MR E11A, M UT YH, N BN, NF1, PA L B2, PTE N, RAD 50, RAD 50 1C, RAD 50 1D, and T p53.  (2) status post right mastectomy with sentinel lymph node sampling 09/28/2014 for an mpT2 pN1, stage IIB invasive breast cancer (with both ductal and lobular features), grade 2, with negative margins  (3) adjuvant chemotherapy to consist of doxorubicin and cyclophosphamide in dose dense fashion 4, starting 11/11/2014,  to be followed by paclitaxel weekly x 12  (4) radiation to follow chemotherapy.  (5) anti-estrogens to follow radiation  (6) history of celiac disease  PLAN: Lucrezia is doing well today. The labs were reviewed in detail and were entirely stable. She will proceed with cycle 2 of paclitaxel as planned today. She will return for IV fluids tomorrow as planned, but will consider discontinuing these infusions as she has been doing so well lately.   Denetta will return next week for labs, an office visit, and cycle 3 of paclitaxel. She understands agrees with this plan. She knows the goal of treatment in her case is cure. She has been encouraged to call with any issues that might arise before her next visit here.   Marcelino Duster, NP   01/28/2015 9:34 AM

## 2015-01-28 NOTE — Telephone Encounter (Signed)
per pof ot sch pt appt-pt to get updated sch today b4 leaving

## 2015-01-29 ENCOUNTER — Ambulatory Visit (HOSPITAL_BASED_OUTPATIENT_CLINIC_OR_DEPARTMENT_OTHER): Payer: BLUE CROSS/BLUE SHIELD

## 2015-01-29 DIAGNOSIS — C50311 Malignant neoplasm of lower-inner quadrant of right female breast: Secondary | ICD-10-CM

## 2015-01-29 DIAGNOSIS — E860 Dehydration: Secondary | ICD-10-CM

## 2015-01-29 DIAGNOSIS — C50511 Malignant neoplasm of lower-outer quadrant of right female breast: Secondary | ICD-10-CM

## 2015-01-29 MED ORDER — DEXTROSE-NACL 5-0.9 % IV SOLN
INTRAVENOUS | Status: DC
  Administered 2015-01-29: 08:00:00 via INTRAVENOUS

## 2015-01-29 NOTE — Patient Instructions (Signed)

## 2015-02-03 ENCOUNTER — Other Ambulatory Visit: Payer: BC Managed Care – PPO

## 2015-02-03 ENCOUNTER — Ambulatory Visit: Payer: BC Managed Care – PPO | Admitting: Nurse Practitioner

## 2015-02-04 ENCOUNTER — Telehealth: Payer: Self-pay | Admitting: Nurse Practitioner

## 2015-02-04 ENCOUNTER — Ambulatory Visit (HOSPITAL_BASED_OUTPATIENT_CLINIC_OR_DEPARTMENT_OTHER): Payer: BLUE CROSS/BLUE SHIELD

## 2015-02-04 ENCOUNTER — Ambulatory Visit (HOSPITAL_BASED_OUTPATIENT_CLINIC_OR_DEPARTMENT_OTHER): Payer: BLUE CROSS/BLUE SHIELD | Admitting: Nurse Practitioner

## 2015-02-04 ENCOUNTER — Ambulatory Visit: Payer: BLUE CROSS/BLUE SHIELD | Admitting: Nurse Practitioner

## 2015-02-04 ENCOUNTER — Encounter: Payer: Self-pay | Admitting: Nurse Practitioner

## 2015-02-04 ENCOUNTER — Other Ambulatory Visit (HOSPITAL_BASED_OUTPATIENT_CLINIC_OR_DEPARTMENT_OTHER): Payer: BLUE CROSS/BLUE SHIELD

## 2015-02-04 ENCOUNTER — Other Ambulatory Visit: Payer: BLUE CROSS/BLUE SHIELD

## 2015-02-04 VITALS — BP 109/54 | HR 79 | Temp 98.1°F | Resp 18 | Ht 61.0 in | Wt 130.5 lb

## 2015-02-04 DIAGNOSIS — C50311 Malignant neoplasm of lower-inner quadrant of right female breast: Secondary | ICD-10-CM

## 2015-02-04 DIAGNOSIS — K900 Celiac disease: Secondary | ICD-10-CM

## 2015-02-04 DIAGNOSIS — Z803 Family history of malignant neoplasm of breast: Secondary | ICD-10-CM

## 2015-02-04 DIAGNOSIS — C50511 Malignant neoplasm of lower-outer quadrant of right female breast: Secondary | ICD-10-CM

## 2015-02-04 DIAGNOSIS — Z5111 Encounter for antineoplastic chemotherapy: Secondary | ICD-10-CM

## 2015-02-04 LAB — COMPREHENSIVE METABOLIC PANEL (CC13)
ALK PHOS: 89 U/L (ref 40–150)
ALT: 36 U/L (ref 0–55)
AST: 33 U/L (ref 5–34)
Albumin: 3.8 g/dL (ref 3.5–5.0)
Anion Gap: 8 mEq/L (ref 3–11)
BILIRUBIN TOTAL: 0.34 mg/dL (ref 0.20–1.20)
BUN: 12.4 mg/dL (ref 7.0–26.0)
CO2: 28 mEq/L (ref 22–29)
CREATININE: 0.7 mg/dL (ref 0.6–1.1)
Calcium: 9.5 mg/dL (ref 8.4–10.4)
Chloride: 108 mEq/L (ref 98–109)
EGFR: >90 mL/min/{1.73_m2} (ref >90)
Glucose: 86 mg/dl (ref 70–140)
Potassium: 4.5 mEq/L (ref 3.5–5.1)
SODIUM: 143 meq/L (ref 136–145)
TOTAL PROTEIN: 6.2 g/dL — AB (ref 6.4–8.3)

## 2015-02-04 LAB — CBC WITH DIFFERENTIAL/PLATELET
BASO%: 1.8 % (ref 0.0–2.0)
Basophils Absolute: 0.1 10*3/uL (ref 0.0–0.1)
EOS ABS: 0.2 10*3/uL (ref 0.0–0.5)
EOS%: 5 % (ref 0.0–7.0)
HCT: 30.3 % — ABNORMAL LOW (ref 34.8–46.6)
HGB: 10.1 g/dL — ABNORMAL LOW (ref 11.6–15.9)
LYMPH#: 0.5 10*3/uL — AB (ref 0.9–3.3)
LYMPH%: 13 % — ABNORMAL LOW (ref 14.0–49.7)
MCH: 32.8 pg (ref 25.1–34.0)
MCHC: 33.4 g/dL (ref 31.5–36.0)
MCV: 98.2 fL (ref 79.5–101.0)
MONO#: 0.3 10*3/uL (ref 0.1–0.9)
MONO%: 7.2 % (ref 0.0–14.0)
NEUT#: 3 10*3/uL (ref 1.5–6.5)
NEUT%: 73 % (ref 38.4–76.8)
PLATELETS: 256 10*3/uL (ref 145–400)
RBC: 3.09 10*6/uL — AB (ref 3.70–5.45)
RDW: 16.7 % — ABNORMAL HIGH (ref 11.2–14.5)
WBC: 4.1 10*3/uL (ref 3.9–10.3)

## 2015-02-04 MED ORDER — DIPHENHYDRAMINE HCL 50 MG/ML IJ SOLN
25.0000 mg | Freq: Once | INTRAMUSCULAR | Status: AC
  Administered 2015-02-04: 25 mg via INTRAVENOUS

## 2015-02-04 MED ORDER — PACLITAXEL CHEMO INJECTION 300 MG/50ML
80.0000 mg/m2 | Freq: Once | INTRAVENOUS | Status: AC
  Administered 2015-02-04: 126 mg via INTRAVENOUS
  Filled 2015-02-04: qty 21

## 2015-02-04 MED ORDER — LORAZEPAM 2 MG/ML IJ SOLN
INTRAMUSCULAR | Status: AC
  Filled 2015-02-04: qty 1

## 2015-02-04 MED ORDER — LORAZEPAM 2 MG/ML IJ SOLN
0.5000 mg | Freq: Once | INTRAMUSCULAR | Status: AC
  Administered 2015-02-04: 0.5 mg via INTRAVENOUS

## 2015-02-04 MED ORDER — HEPARIN SOD (PORK) LOCK FLUSH 100 UNIT/ML IV SOLN
500.0000 [IU] | Freq: Once | INTRAVENOUS | Status: AC | PRN
  Administered 2015-02-04: 500 [IU]
  Filled 2015-02-04: qty 5

## 2015-02-04 MED ORDER — FAMOTIDINE IN NACL 20-0.9 MG/50ML-% IV SOLN
20.0000 mg | Freq: Once | INTRAVENOUS | Status: AC
  Administered 2015-02-04: 20 mg via INTRAVENOUS

## 2015-02-04 MED ORDER — DEXAMETHASONE SODIUM PHOSPHATE 10 MG/ML IJ SOLN
INTRAMUSCULAR | Status: AC
  Filled 2015-02-04: qty 1

## 2015-02-04 MED ORDER — ONDANSETRON 8 MG/50ML IVPB (CHCC)
8.0000 mg | Freq: Once | INTRAVENOUS | Status: AC
  Administered 2015-02-04: 8 mg via INTRAVENOUS

## 2015-02-04 MED ORDER — DIPHENHYDRAMINE HCL 50 MG/ML IJ SOLN
INTRAMUSCULAR | Status: AC
  Filled 2015-02-04: qty 1

## 2015-02-04 MED ORDER — SODIUM CHLORIDE 0.9 % IJ SOLN
10.0000 mL | INTRAMUSCULAR | Status: DC | PRN
  Administered 2015-02-04: 10 mL
  Filled 2015-02-04: qty 10

## 2015-02-04 MED ORDER — DEXAMETHASONE SODIUM PHOSPHATE 10 MG/ML IJ SOLN
4.0000 mg | Freq: Once | INTRAMUSCULAR | Status: AC
  Administered 2015-02-04: 4 mg via INTRAVENOUS

## 2015-02-04 MED ORDER — FAMOTIDINE IN NACL 20-0.9 MG/50ML-% IV SOLN
INTRAVENOUS | Status: AC
  Filled 2015-02-04: qty 50

## 2015-02-04 MED ORDER — SODIUM CHLORIDE 0.9 % IV SOLN
Freq: Once | INTRAVENOUS | Status: AC
  Administered 2015-02-04: 10:00:00 via INTRAVENOUS

## 2015-02-04 MED ORDER — ONDANSETRON 8 MG/NS 50 ML IVPB
INTRAVENOUS | Status: AC
  Filled 2015-02-04: qty 8

## 2015-02-04 NOTE — Telephone Encounter (Signed)
per pof to CX IVF on 2/13-Cx per pof-pt to get updated sch b4 leaving

## 2015-02-04 NOTE — Progress Notes (Signed)
[facility]  [phone] [phone]) [phone]     ID: Misty [name] DOB: 01-06-1976  MR#: [id number]  JIR#:[id number]  Patient Care Team: Misty Chroman, MD as PCP - General (Internal Medicine) Misty Skates, MD as Consulting Physician (General Surgery) Misty Cruel, MD as Consulting Physician (Oncology) PCP: Misty [name]., MD Misty Held, MD (GYN) Misty Reese M.D. (Plastics)  CHIEF COMPLAINT: Estrogen receptor positive breast cancer CURRENT TREATMENT: adjuvant chemotherapy    BREAST CANCER HISTORY: From the original intake note:  Misty [name] was closely followed for some right breast changes between 20 11/12/2012. Everything seemed stable at that time. In 2014 she was promoted in her job, was working very hard, and actually missed having a mammogram. Late in January 2015 she noted some pain in her right breast and by February when she lifted her arms she noted that the nipple was tilting sideways and seemed a little bit of gathered. She didn't think much of this however. It was not until July that she noted a palpable mass in the lateral right breast. She brought this to her gynecologist's attention and she was set up for diagnostic mammography at [facility] 08/04/2014.   There was an area of distortion in the lateral aspect of the right breast noted on mammography,  corresponding to a palpable firm mass at the 8:00 position of the right breast. Ultrasound confirmed a 2.4 cm irregular hypoechoic spiculated mass in the right breast at the 8:00 position and in addition at the 5:00 position 1 cm from the nipple there was a taller than wide irregular hypoechoic mass measuring 0.8 cm. There was no right axillary adenopathy. In the left breast upper-outer quadrant there was a minimally complicated cyst measuring 0.7 cm.  On 08/11/2014 the patient underwent biopsy of the 2 right breast masses noted on ultrasound. Both showed invasive ductal carcinoma, grade 1,  estrogen receptor 90% positive, progesterone receptor 90% positive, both with strong staining intensity, HER-2 equivocal at 2+ but negative by FISH, with the signals ratio being 1.20 and the number per nucleus 2.1. The proliferation fraction by MIB-1 was 17% ([id number]; this report was reviewed by our pathologist, who concurred, [name] [phone]).  On 08/25/2014 the patient underwent bilateral breast MRI at [facility]. This showed, in the right breast, an irregular enhancing mass at the 8:00 position measuring 2.7 cm. Also in the right breast at the 6:00 position there was another enhancing mass measuring 0.6 cm, located 2 cm away from the larger mass. There were also masses in the retroareolar position measuring 0.7 cm, in the posterior central right breast measuring 0.5 cm, and another one just inferior to the midline measuring 5.5 cm. There were no masses or findings of concern in the left breast and no abnormal appearing lymph nodes.  The patient's subsequent history is as detailed below  INTERVAL HISTORY: Misty [name] returns today for follow up of her breast cancer, accompanied by her mother. Today she is due for cycle 3 of paclitaxel.    REVIEW OF SYSTEMS: Misty [name] denies pain, fevers, or chills. She had some mild nausea and constipation but "neither are hardly worth mentioning". She is eating and drinking well. She felt great after IV fluids this past Saturday. She denies mouth sores or rashes. She has no shortness of breath, chest pain, cough, or palpitations. Her energy level is fair. She is sleeping well.  A detailed review of systems is otherwise negative.   PAST MEDICAL HISTORY: Past Medical History  Diagnosis Date  .  Thyroid disease   . Celiac disease   . PONV (postoperative nausea and vomiting)   . Hypothyroidism   . Anxiety   . Headache(784.0)   . Cancer     breast cancer    PAST SURGICAL HISTORY: Past Surgical History  Procedure Laterality Date  . Cesarean section    .  Gallbladder surgery    . Cholecystectomy    . Simple mastectomy with axillary sentinel node biopsy Right 09/28/2014    Procedure: RIGHT TOTAL MASTECTOMY, RIGHT  AXILLARY SENTINEL NODE BIOPSY;  Surgeon: Misty Skates, MD;  Location: [facility];  Service: General;  Laterality: Right;  . Portacath placement Right 10/22/2014    Procedure: INSERTION PORT-A-CATH;  Surgeon: Misty Skates, MD;  Location: [facility];  Service: General;  Laterality: Right;    FAMILY HISTORY Family History  Problem Relation Age of Onset  . Skin cancer Father   . Breast cancer Maternal Grandmother     dx unknown age  . Breast cancer Other 79    mat great aunt through [facility] with breast cancer   the patient's parents are both living, in their late 19s. The patient had one brother, no sisters. The patient's maternal grandmother was diagnosed with breast cancer in her late 40s. One of that grandmother's sisters was also diagnosed with breast cancer, in her 43s. There is no other history of breast or ovarian cancer in the family  GYNECOLOGIC HISTORY:  No LMP recorded. Menarche age 39, first live birth age 51, the patient is GX P1. She is still having regular periods. 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 Her husband, Misty [name], works as a Engineer, structural in [place]. Their son Misty [name] is 46. The patient attends a local [place]: Not in place   HEALTH MAINTENANCE: History  Substance Use Topics  . Smoking status: Never Smoker   . Smokeless tobacco: Not on file  . Alcohol Use: No     Colonoscopy:  PAP: 07/29/2014  Bone density:  Lipid panel:  Allergies  Allergen Reactions  . Gluten Meal   . Penicillins Nausea And Vomiting    Current Outpatient Prescriptions  Medication Sig Dispense Refill  .  levothyroxine (SYNTHROID) 100 MCG tablet Take 1 tablet (100 mcg total) by mouth daily before breakfast. 30 tablet 6  . prochlorperazine (COMPAZINE) 10 MG tablet Take 1 tablet (10 mg total) by mouth every 6 (six) hours as needed (Nausea or vomiting). 30 tablet 1  . HYDROcodone-acetaminophen (NORCO) 5-325 MG per tablet Take 1-2 tablets by mouth every 6 (six) hours as needed for moderate pain or severe pain. (Patient not taking: Reported on 12/08/2014) 30 tablet 0  . LORazepam (ATIVAN) 0.5 MG tablet Take 1 tablet (0.5 mg total) by mouth every 6 (six) hours as needed (Nausea or vomiting). (Patient not taking: Reported on 01/21/2015) 30 tablet 0  . ondansetron (ZOFRAN) 8 MG tablet Take 1 tablet (8 mg total) by mouth 2 (two) times daily. Start the day after chemo for 2 days. Then take as needed for nausea or vomiting. (Patient not taking: Reported on 01/06/2015) 30 tablet 1   No current facility-administered medications for this visit.    OBJECTIVE: Middle-aged white woman in no acute distress Filed Vitals:   02/04/15 0921  BP: 109/54  Pulse: 79  Temp: 98.1 F (36.7 C)  Resp: 18     Body mass index is 24.67 kg/(m^2).    ECOG FS:1 - Symptomatic but completely ambulatory  Skin: warm, dry  HEENT: sclerae anicteric, conjunctivae pink, oropharynx clear. No thrush or mucositis.  Lymph Nodes: No cervical or supraclavicular lymphadenopathy  Lungs: clear to auscultation bilaterally, no rales, wheezes, or rhonci  Heart: regular rate and rhythm  Abdomen: round, soft, non tender, positive bowel sounds  Musculoskeletal: No focal spinal tenderness, no peripheral edema  Neuro: non focal, well oriented, positive affect  Breast: deferred  LAB RESULTS:  CMP     Component Value Date/Time   NA 143 01/28/2015 0850   NA 142 09/23/2014 1456   K 4.6 01/28/2015 0850   K 3.9 09/23/2014 1456   CL 104 09/23/2014 1456   CO2 29 01/28/2015 0850   CO2 27 09/23/2014 1456   GLUCOSE 82 01/28/2015 0850   GLUCOSE 71  09/23/2014 1456   BUN 13.5 01/28/2015 0850   BUN 11 09/23/2014 1456   CREATININE 0.7 01/28/2015 0850   CREATININE 0.70 09/23/2014 1456   CALCIUM 9.4 01/28/2015 0850   CALCIUM 9.8 09/23/2014 1456   PROT 6.0* 01/28/2015 0850   PROT 7.7 09/23/2014 1456   ALBUMIN 3.7 01/28/2015 0850   ALBUMIN 4.0 09/23/2014 1456   AST 30 01/28/2015 0850   AST 19 09/23/2014 1456   ALT 40 01/28/2015 0850   ALT 12 09/23/2014 1456   ALKPHOS 88 01/28/2015 0850   ALKPHOS 60 09/23/2014 1456   BILITOT 0.36 01/28/2015 0850   BILITOT 0.5 09/23/2014 1456   GFRNONAA >90 09/23/2014 1456   GFRAA >90 09/23/2014 1456    I No results found for: SPEP  Lab Results  Component Value Date   WBC 4.1 02/04/2015   NEUTROABS 3.0 02/04/2015   HGB 10.1* 02/04/2015   HCT 30.3* 02/04/2015   MCV 98.2 02/04/2015   PLT 256 02/04/2015      Chemistry      Component Value Date/Time   NA 143 01/28/2015 0850   NA 142 09/23/2014 1456   K 4.6 01/28/2015 0850   K 3.9 09/23/2014 1456   CL 104 09/23/2014 1456   CO2 29 01/28/2015 0850   CO2 27 09/23/2014 1456   BUN 13.5 01/28/2015 0850   BUN 11 09/23/2014 1456   CREATININE 0.7 01/28/2015 0850   CREATININE 0.70 09/23/2014 1456      Component Value Date/Time   CALCIUM 9.4 01/28/2015 0850   CALCIUM 9.8 09/23/2014 1456   ALKPHOS 88 01/28/2015 0850   ALKPHOS 60 09/23/2014 1456   AST 30 01/28/2015 0850   AST 19 09/23/2014 1456   ALT 40 01/28/2015 0850   ALT 12 09/23/2014 1456   BILITOT 0.36 01/28/2015 0850   BILITOT 0.5 09/23/2014 1456       No results found for: LABCA2  No components found for: LABCA125  No results for input(s): INR in the last 168 hours.  Urinalysis No results found for: COLORURINE  STUDIES: Most recent echocardiogram on 10/27/14 showed an ejection fraction of 50-55%  No results found.  ASSESSMENT: 39 y.o. BRCA negative [place], [place] woman status post biopsy of 2 separate right breast masses 08/11/2014, both positive for an invasive  ductal carcinoma, grade 1, estrogen and progesterone receptor strongly positive, with an MIB-1 of 19%, and HER-2 nonamplified  (1) genetics testing sent 08/26/2014, showing no mutations in the BRCA genes; there were also no mutations in ATM, BARD 1, BRIP1, [place] 1, CHE K2, MR E11A, M UT YH,  N BN, NF1, PA L B2, PTE N, RAD 50, RAD 50 1C, RAD 50 1D, and T p53.  (2) status post right mastectomy with sentinel lymph node sampling 09/28/2014 for an mpT2 pN1, stage IIB invasive breast cancer (with both ductal and lobular features), grade 2, with negative margins  (3) adjuvant chemotherapy to consist of doxorubicin and cyclophosphamide in dose dense fashion 4, starting 11/11/2014,  to be followed by paclitaxel weekly x 12  (4) radiation to follow chemotherapy.  (5) anti-estrogens to follow radiation  (6) history of celiac disease  PLAN: Desirai continues to manage treatment well. The labs were reviewed in detail and were entirely stable. She will proceed with cycle 3 of paclitaxel as planned today. She is choosing this to forgo IV fluids this Saturday as she has been able to stay hydrated on on her own.  Carnelia will return next week for labs, an office visit, and cycle 4 of paclitaxel. She understands agrees with this plan. She knows the goal of treatment in her case is cure. She has been encouraged to call with any issues that might arise before her next visit here.   Marcelino Duster, NP   02/04/2015 9:45 AM

## 2015-02-04 NOTE — Patient Instructions (Signed)
[facility] Discharge Instructions for Patients Receiving Chemotherapy  Today you received the following chemotherapy agents taxol  To help prevent nausea and vomiting after your treatment, we encourage you to take your nausea medication as directed   If you develop nausea and vomiting that is not controlled by your nausea medication, call the clinic.   BELOW ARE SYMPTOMS THAT SHOULD BE REPORTED IMMEDIATELY:  *FEVER GREATER THAN 100.5 F  *CHILLS WITH OR WITHOUT FEVER  NAUSEA AND VOMITING THAT IS NOT CONTROLLED WITH YOUR NAUSEA MEDICATION  *UNUSUAL SHORTNESS OF BREATH  *UNUSUAL BRUISING OR BLEEDING  TENDERNESS IN MOUTH AND THROAT WITH OR WITHOUT PRESENCE OF ULCERS  *URINARY PROBLEMS  *BOWEL PROBLEMS  UNUSUAL RASH Items with * indicate a potential emergency and should be followed up as soon as possible.  Feel free to call the clinic you have any questions or concerns. The clinic phone number is [phone].  

## 2015-02-05 ENCOUNTER — Ambulatory Visit: Payer: BLUE CROSS/BLUE SHIELD

## 2015-02-11 ENCOUNTER — Ambulatory Visit (HOSPITAL_BASED_OUTPATIENT_CLINIC_OR_DEPARTMENT_OTHER): Payer: BLUE CROSS/BLUE SHIELD

## 2015-02-11 ENCOUNTER — Other Ambulatory Visit (HOSPITAL_BASED_OUTPATIENT_CLINIC_OR_DEPARTMENT_OTHER): Payer: BLUE CROSS/BLUE SHIELD

## 2015-02-11 ENCOUNTER — Ambulatory Visit (HOSPITAL_BASED_OUTPATIENT_CLINIC_OR_DEPARTMENT_OTHER): Payer: BLUE CROSS/BLUE SHIELD | Admitting: Oncology

## 2015-02-11 ENCOUNTER — Telehealth: Payer: Self-pay | Admitting: Oncology

## 2015-02-11 VITALS — BP 118/52 | HR 84 | Temp 98.1°F | Resp 18 | Ht 61.0 in | Wt 131.2 lb

## 2015-02-11 DIAGNOSIS — K900 Celiac disease: Secondary | ICD-10-CM

## 2015-02-11 DIAGNOSIS — Z5111 Encounter for antineoplastic chemotherapy: Secondary | ICD-10-CM

## 2015-02-11 DIAGNOSIS — C50511 Malignant neoplasm of lower-outer quadrant of right female breast: Secondary | ICD-10-CM

## 2015-02-11 DIAGNOSIS — C50311 Malignant neoplasm of lower-inner quadrant of right female breast: Secondary | ICD-10-CM

## 2015-02-11 DIAGNOSIS — Z803 Family history of malignant neoplasm of breast: Secondary | ICD-10-CM

## 2015-02-11 LAB — CBC WITH DIFFERENTIAL/PLATELET
BASO%: 0.6 % (ref 0.0–2.0)
Basophils Absolute: 0 10*3/uL (ref 0.0–0.1)
EOS%: 3.4 % (ref 0.0–7.0)
Eosinophils Absolute: 0.2 10*3/uL (ref 0.0–0.5)
HCT: 29.3 % — ABNORMAL LOW (ref 34.8–46.6)
HGB: 10 g/dL — ABNORMAL LOW (ref 11.6–15.9)
LYMPH%: 13.1 % — ABNORMAL LOW (ref 14.0–49.7)
MCH: 32.8 pg (ref 25.1–34.0)
MCHC: 34.1 g/dL (ref 31.5–36.0)
MCV: 96.1 fL (ref 79.5–101.0)
MONO#: 0.3 10*3/uL (ref 0.1–0.9)
MONO%: 5.5 % (ref 0.0–14.0)
NEUT#: 3.7 10*3/uL (ref 1.5–6.5)
NEUT%: 77.4 % — AB (ref 38.4–76.8)
PLATELETS: 209 10*3/uL (ref 145–400)
RBC: 3.05 10*6/uL — ABNORMAL LOW (ref 3.70–5.45)
RDW: 15.1 % — ABNORMAL HIGH (ref 11.2–14.5)
WBC: 4.7 10*3/uL (ref 3.9–10.3)
lymph#: 0.6 10*3/uL — ABNORMAL LOW (ref 0.9–3.3)

## 2015-02-11 LAB — COMPREHENSIVE METABOLIC PANEL (CC13)
ALBUMIN: 3.7 g/dL (ref 3.5–5.0)
ALT: 29 U/L (ref 0–55)
ANION GAP: 9 meq/L (ref 3–11)
AST: 26 U/L (ref 5–34)
Alkaline Phosphatase: 90 U/L (ref 40–150)
BUN: 14.6 mg/dL (ref 7.0–26.0)
CALCIUM: 9.3 mg/dL (ref 8.4–10.4)
CO2: 25 meq/L (ref 22–29)
Chloride: 107 mEq/L (ref 98–109)
Creatinine: 0.7 mg/dL (ref 0.6–1.1)
EGFR: >90 mL/min/{1.73_m2} (ref >90)
GLUCOSE: 94 mg/dL (ref 70–140)
POTASSIUM: 4 meq/L (ref 3.5–5.1)
Sodium: 141 mEq/L (ref 136–145)
TOTAL PROTEIN: 6.1 g/dL — AB (ref 6.4–8.3)
Total Bilirubin: 0.44 mg/dL (ref 0.20–1.20)

## 2015-02-11 MED ORDER — FAMOTIDINE IN NACL 20-0.9 MG/50ML-% IV SOLN
INTRAVENOUS | Status: AC
  Filled 2015-02-11: qty 50

## 2015-02-11 MED ORDER — DIPHENHYDRAMINE HCL 50 MG/ML IJ SOLN
25.0000 mg | Freq: Once | INTRAMUSCULAR | Status: AC
  Administered 2015-02-11: 25 mg via INTRAVENOUS

## 2015-02-11 MED ORDER — SODIUM CHLORIDE 0.9 % IV SOLN
Freq: Once | INTRAVENOUS | Status: AC
  Administered 2015-02-11: 15:00:00 via INTRAVENOUS

## 2015-02-11 MED ORDER — SODIUM CHLORIDE 0.9 % IJ SOLN
10.0000 mL | INTRAMUSCULAR | Status: DC | PRN
  Administered 2015-02-11: 10 mL
  Filled 2015-02-11: qty 10

## 2015-02-11 MED ORDER — HEPARIN SOD (PORK) LOCK FLUSH 100 UNIT/ML IV SOLN
500.0000 [IU] | Freq: Once | INTRAVENOUS | Status: AC | PRN
  Administered 2015-02-11: 500 [IU]
  Filled 2015-02-11: qty 5

## 2015-02-11 MED ORDER — DIPHENHYDRAMINE HCL 50 MG/ML IJ SOLN
INTRAMUSCULAR | Status: AC
  Filled 2015-02-11: qty 1

## 2015-02-11 MED ORDER — FAMOTIDINE IN NACL 20-0.9 MG/50ML-% IV SOLN
20.0000 mg | Freq: Once | INTRAVENOUS | Status: AC
  Administered 2015-02-11: 20 mg via INTRAVENOUS

## 2015-02-11 MED ORDER — ONDANSETRON 8 MG/NS 50 ML IVPB
INTRAVENOUS | Status: AC
  Filled 2015-02-11: qty 8

## 2015-02-11 MED ORDER — ONDANSETRON 8 MG/50ML IVPB (CHCC)
8.0000 mg | Freq: Once | INTRAVENOUS | Status: AC
  Administered 2015-02-11: 8 mg via INTRAVENOUS

## 2015-02-11 MED ORDER — LORAZEPAM 2 MG/ML IJ SOLN
INTRAMUSCULAR | Status: AC
  Filled 2015-02-11: qty 1

## 2015-02-11 MED ORDER — DEXTROSE 5 % IV SOLN
80.0000 mg/m2 | Freq: Once | INTRAVENOUS | Status: AC
  Administered 2015-02-11: 126 mg via INTRAVENOUS
  Filled 2015-02-11: qty 21

## 2015-02-11 MED ORDER — LORAZEPAM 2 MG/ML IJ SOLN
0.5000 mg | Freq: Once | INTRAMUSCULAR | Status: AC
  Administered 2015-02-11: 0.5 mg via INTRAVENOUS

## 2015-02-11 MED ORDER — DEXAMETHASONE SODIUM PHOSPHATE 10 MG/ML IJ SOLN
INTRAMUSCULAR | Status: AC
  Filled 2015-02-11: qty 1

## 2015-02-11 MED ORDER — DEXAMETHASONE SODIUM PHOSPHATE 10 MG/ML IJ SOLN
4.0000 mg | Freq: Once | INTRAMUSCULAR | Status: AC
  Administered 2015-02-11: 4 mg via INTRAVENOUS

## 2015-02-11 NOTE — Progress Notes (Signed)
Misty [name]  [phone] [phone]) [phone]     ID: Misty [name] DOB: 09/22/1976  MR#: [id number]  QBH#:[id number]  Patient Care Team: Glenda Chroman, MD as PCP - General (Internal Medicine) Fanny Skates, MD as Consulting Physician (General Surgery) Chauncey Cruel, MD as Consulting Physician (Oncology) PCP: Glenda Chroman., MD Santo Held, MD (GYN) Crissie Reese M.D. (Plastics)  CHIEF COMPLAINT: Estrogen receptor positive breast cancer  CURRENT TREATMENT: adjuvant chemotherapy    BREAST CANCER HISTORY: From the original intake note:  Misty [name] was closely followed for some right breast changes between 20 11/12/2012. Everything seemed stable at that time. In 2014 she was promoted in her job, was working very hard, and actually missed having a mammogram. Late in January 2015 she noted some pain in her right breast and by February when she lifted her arms she noted that the nipple was tilting sideways and seemed a little bit of gathered. She didn't think much of this however. It was not until July that she noted a palpable mass in the lateral right breast. She brought this to her gynecologist's attention and she was set up for diagnostic mammography at [facility] 08/04/2014.   There was an area of distortion in the lateral aspect of the right breast noted on mammography,  corresponding to a palpable firm mass at the 8:00 position of the right breast. Ultrasound confirmed a 2.4 cm irregular hypoechoic spiculated mass in the right breast at the 8:00 position and in addition at the 5:00 position 1 cm from the nipple there was a taller than wide irregular hypoechoic mass measuring 0.8 cm. There was no right axillary adenopathy. In the left breast upper-outer quadrant there was a minimally complicated cyst measuring 0.7 cm.  On 08/11/2014 the patient underwent biopsy of the 2 right breast masses noted on ultrasound. Both showed invasive ductal carcinoma, grade 1,  estrogen receptor 90% positive, progesterone receptor 90% positive, both with strong staining intensity, HER-2 equivocal at 2+ but negative by FISH, with the signals ratio being 1.20 and the number per nucleus 2.1. The proliferation fraction by MIB-1 was 17% ([id number]; this report was reviewed by our pathologist, who concurred, [name] [phone]).  On 08/25/2014 the patient underwent bilateral breast MRI at [facility]. This showed, in the right breast, an irregular enhancing mass at the 8:00 position measuring 2.7 cm. Also in the right breast at the 6:00 position there was another enhancing mass measuring 0.6 cm, located 2 cm away from the larger mass. There were also masses in the retroareolar position measuring 0.7 cm, in the posterior central right breast measuring 0.5 cm, and another one just inferior to the midline measuring 5.5 cm. There were no masses or findings of concern in the left breast and no abnormal appearing lymph nodes.  The patient's subsequent history is as detailed below  INTERVAL HISTORY: Misty [name] returns today for follow up of her breast cancer, accompanied by her friend Misty [name]. Today is day 1 cycle 4 of 12 planned weekly doses of paclitaxel.    REVIEW OF SYSTEMS: Misty [name] is doing much better with the Taxol chemotherapy. Initially she was scheduled to receive fluids on day 2, but she did not feel that she needed that last weekend, and she did just fine. Accordingly we can go ahead and cancel the remaining day 2 IV fluids originally planned. She does have minimal nausea, and she takes Zofran perhaps once over the weekend. She has had no vomiting problems. She feels a little tired  but continues to work full-time. She is not exercising, but enjoys playing cards over the weekend. She is concerned that she has gained 11 pounds since the start of chemotherapy. She was constipated minimally initially, but currently takes a stool softener on day 2 and that is all it takes. The only evidence  of peripheral neuropathy she has had is minimal tingling in her fingertips days 2 and 3. That resolves by day 4. She has actually no peripheral neuropathy symptoms at present. Note that she stopped having periods in early December. She is having mild hot flashes. The port is working well. A detailed review of systems today was otherwise stable  PAST MEDICAL HISTORY: Past Medical History  Diagnosis Date  . Thyroid disease   . Celiac disease   . PONV (postoperative nausea and vomiting)   . Hypothyroidism   . Anxiety   . Headache(784.0)   . Cancer     breast cancer    PAST SURGICAL HISTORY: Past Surgical History  Procedure Laterality Date  . Cesarean section    . Gallbladder surgery    . Cholecystectomy    . Simple mastectomy with axillary sentinel node biopsy Right 09/28/2014    Procedure: RIGHT TOTAL MASTECTOMY, RIGHT  AXILLARY SENTINEL NODE BIOPSY;  Surgeon: Fanny Skates, MD;  Location: [facility];  Service: General;  Laterality: Right;  . Portacath placement Right 10/22/2014    Procedure: INSERTION PORT-A-CATH;  Surgeon: Fanny Skates, MD;  Location: [place];  Service: General;  Laterality: Right;    FAMILY HISTORY Family History  Problem Relation Age of Onset  . Skin cancer Father   . Breast cancer Maternal Grandmother     dx unknown age  . Breast cancer Other 7    mat great aunt through [facility] with breast cancer   the patient's parents are both living, in their late 64s. The patient had one brother, no sisters. The patient's maternal grandmother was diagnosed with breast cancer in her late 39s. One of that grandmother's sisters was also diagnosed with breast cancer, in her 49s. There is no other history of breast or ovarian cancer in the family  GYNECOLOGIC HISTORY:  No LMP recorded. Menarche age 30, first live birth age 67, the patient is GX P1. 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 Her husband, [name], works as a Engineer, structural in [place]. Their son [name] is 61. The patient attends a local [place]: Not in place   HEALTH MAINTENANCE: History  Substance Use Topics  . Smoking status: Never Smoker   . Smokeless tobacco: Not on file  . Alcohol Use: No     Colonoscopy:  PAP: 07/29/2014  Bone density:  Lipid panel:  Allergies  Allergen Reactions  . Gluten Meal   . Penicillins Nausea And Vomiting    Current Outpatient Prescriptions  Medication Sig Dispense Refill  . HYDROcodone-acetaminophen (NORCO) 5-325 MG per tablet Take 1-2 tablets by mouth every 6 (six) hours as needed for moderate pain or severe pain. (Patient not taking: Reported on 12/08/2014) 30 tablet 0  . levothyroxine (SYNTHROID) 100 MCG tablet Take 1 tablet (100 mcg total) by mouth daily before breakfast. 30 tablet 6  . LORazepam (ATIVAN) 0.5 MG tablet  Take 1 tablet (0.5 mg total) by mouth every 6 (six) hours as needed (Nausea or vomiting). (Patient not taking: Reported on 01/21/2015) 30 tablet 0  . ondansetron (ZOFRAN) 8 MG tablet Take 1 tablet (8 mg total) by mouth 2 (two) times daily. Start the day after chemo for 2 days. Then take as needed for nausea or vomiting. (Patient not taking: Reported on 01/06/2015) 30 tablet 1  . prochlorperazine (COMPAZINE) 10 MG tablet Take 1 tablet (10 mg total) by mouth every 6 (six) hours as needed (Nausea or vomiting). 30 tablet 1   No current facility-administered medications for this visit.    OBJECTIVE: Middle-aged white woman who appears stated age 39 Vitals:   02/11/15 1407  BP: 118/52  Pulse: 84  Temp: 98.1 F (36.7 C)  Resp: 18     Body mass index is 24.8 kg/(m^2).    ECOG FS:0 - Asymptomatic  Sclerae  unicteric, pupils equal and reactive Oropharynx clear and moist-- no thrush or other lesions No cervical or supraclavicular adenopathy Lungs no rales or rhonchi Heart regular rate and rhythm Abd soft, nontender, positive bowel sounds MSK no focal spinal tenderness, no upper extremity lymphedema Neuro: nonfocal, well oriented, appropriate affect Breasts: Deferred   LAB RESULTS:  CMP     Component Value Date/Time   NA 143 02/04/2015 0911   NA 142 09/23/2014 1456   K 4.5 02/04/2015 0911   K 3.9 09/23/2014 1456   CL 104 09/23/2014 1456   CO2 28 02/04/2015 0911   CO2 27 09/23/2014 1456   GLUCOSE 86 02/04/2015 0911   GLUCOSE 71 09/23/2014 1456   BUN 12.4 02/04/2015 0911   BUN 11 09/23/2014 1456   CREATININE 0.7 02/04/2015 0911   CREATININE 0.70 09/23/2014 1456   CALCIUM 9.5 02/04/2015 0911   CALCIUM 9.8 09/23/2014 1456   PROT 6.2* 02/04/2015 0911   PROT 7.7 09/23/2014 1456   ALBUMIN 3.8 02/04/2015 0911   ALBUMIN 4.0 09/23/2014 1456   AST 33 02/04/2015 0911   AST 19 09/23/2014 1456   ALT 36 02/04/2015 0911   ALT 12 09/23/2014 1456   ALKPHOS 89 02/04/2015 0911   ALKPHOS 60 09/23/2014 1456   BILITOT 0.34 02/04/2015 0911   BILITOT 0.5 09/23/2014 1456   GFRNONAA >90 09/23/2014 1456   GFRAA >90 09/23/2014 1456    I No results found for: SPEP  Lab Results  Component Value Date   WBC 4.7 02/11/2015   NEUTROABS 3.7 02/11/2015   HGB 10.0* 02/11/2015   HCT 29.3* 02/11/2015   MCV 96.1 02/11/2015   PLT 209 02/11/2015      Chemistry      Component Value Date/Time   NA 143 02/04/2015 0911   NA 142 09/23/2014 1456   K 4.5 02/04/2015 0911   K 3.9 09/23/2014 1456   CL 104 09/23/2014 1456   CO2 28 02/04/2015 0911   CO2 27 09/23/2014 1456   BUN 12.4 02/04/2015 0911   BUN 11 09/23/2014 1456   CREATININE 0.7 02/04/2015 0911   CREATININE 0.70 09/23/2014 1456      Component Value Date/Time   CALCIUM 9.5 02/04/2015 0911   CALCIUM 9.8 09/23/2014 1456   ALKPHOS 89  02/04/2015 0911   ALKPHOS 60 09/23/2014 1456   AST 33 02/04/2015 0911   AST 19 09/23/2014 1456   ALT 36 02/04/2015 0911   ALT 12 09/23/2014 1456   BILITOT 0.34 02/04/2015 0911   BILITOT 0.5 09/23/2014 1456       No results  found for: LABCA2  No components found for: [id number]  No results for input(s): INR in the last 168 hours.  Urinalysis No results found for: COLORURINE  STUDIES: No results found.  ASSESSMENT: 39 y.o. BRCA negative [place], [place] woman status post biopsy of 2 separate right breast masses 08/11/2014, both positive for an invasive ductal carcinoma, grade 1, estrogen and progesterone receptor strongly positive, with an MIB-1 of 19%, and HER-2 nonamplified  (1) genetics testing sent 08/26/2014, showing no mutations in the BRCA genes; there were also no mutations in ATM, BARD 1, BRIP1, CDH 1, CHE K2, MR E11A, M UT YH, N BN, NF1, PA L B2, PTE N, RAD 50, RAD 50 1C, RAD 50 1D, and T p53.  (2) status post right mastectomy with sentinel lymph node sampling 09/28/2014 for an mpT2 pN1, stage IIB invasive breast cancer (with both ductal and lobular features), grade 2, with negative margins  (3) adjuvant chemotherapy to consist of doxorubicin and cyclophosphamide in dose dense fashion 4, completed 12/30/2014, followed by paclitaxel weekly x 12 started 01/21/2015.  (4) radiation to follow chemotherapy.  (5) anti-estrogens to follow radiation  (6) history of celiac disease  PLAN: Aarika is tolerating the weekly paclitaxel much better than she tolerated the every other week doxorubicin/cyclophosphamide. She does not feel she needs any fluids on day 2 so we are canceling that intervention. She will proceed with her fourth of 12 planned weekly treatments today.  The only concern is peripheral neuropathy. She had minimal tingling in her fingertips days 3 and 4 only after her third dose of Taxol. Those symptoms have completely resolved so we can safely proceed to treatment  today. However if the symptoms were to persist to the time before the next treatment we would have to hold at least for one week and consider canceling.  She has a good understanding of the overall plan. She agrees with it. She knows the goal of treatment in her case is cure. She will call with any problems that may develop before her next visit here.  Chauncey Cruel, MD   02/11/2015 2:14 PM

## 2015-02-11 NOTE — Patient Instructions (Signed)
[facility] Discharge Instructions for Patients Receiving Chemotherapy  Today you received the following chemotherapy agents: Taxol.  To help prevent nausea and vomiting after your treatment, we encourage you to take your nausea medication as prescribed.   If you develop nausea and vomiting that is not controlled by your nausea medication, call the clinic.   BELOW ARE SYMPTOMS THAT SHOULD BE REPORTED IMMEDIATELY:  *FEVER GREATER THAN 100.5 F  *CHILLS WITH OR WITHOUT FEVER  NAUSEA AND VOMITING THAT IS NOT CONTROLLED WITH YOUR NAUSEA MEDICATION  *UNUSUAL SHORTNESS OF BREATH  *UNUSUAL BRUISING OR BLEEDING  TENDERNESS IN MOUTH AND THROAT WITH OR WITHOUT PRESENCE OF ULCERS  *URINARY PROBLEMS  *BOWEL PROBLEMS  UNUSUAL RASH Items with * indicate a potential emergency and should be followed up as soon as possible.  Feel free to call the clinic you have any questions or concerns. The clinic phone number is [phone].    

## 2015-02-11 NOTE — Telephone Encounter (Signed)
per pof to CX All ivf appts-CX ivf-pt aware

## 2015-02-12 ENCOUNTER — Ambulatory Visit: Payer: BLUE CROSS/BLUE SHIELD

## 2015-02-18 ENCOUNTER — Encounter: Payer: Self-pay | Admitting: Nurse Practitioner

## 2015-02-18 ENCOUNTER — Other Ambulatory Visit (HOSPITAL_BASED_OUTPATIENT_CLINIC_OR_DEPARTMENT_OTHER): Payer: BLUE CROSS/BLUE SHIELD

## 2015-02-18 ENCOUNTER — Ambulatory Visit (HOSPITAL_BASED_OUTPATIENT_CLINIC_OR_DEPARTMENT_OTHER): Payer: BLUE CROSS/BLUE SHIELD | Admitting: Nurse Practitioner

## 2015-02-18 ENCOUNTER — Ambulatory Visit (HOSPITAL_BASED_OUTPATIENT_CLINIC_OR_DEPARTMENT_OTHER): Payer: BLUE CROSS/BLUE SHIELD

## 2015-02-18 ENCOUNTER — Telehealth: Payer: Self-pay | Admitting: *Deleted

## 2015-02-18 ENCOUNTER — Telehealth: Payer: Self-pay | Admitting: Nurse Practitioner

## 2015-02-18 VITALS — BP 112/56 | HR 76 | Temp 98.5°F | Resp 18 | Ht 61.0 in | Wt 131.5 lb

## 2015-02-18 DIAGNOSIS — C50511 Malignant neoplasm of lower-outer quadrant of right female breast: Secondary | ICD-10-CM

## 2015-02-18 DIAGNOSIS — T451X5A Adverse effect of antineoplastic and immunosuppressive drugs, initial encounter: Secondary | ICD-10-CM

## 2015-02-18 DIAGNOSIS — C50311 Malignant neoplasm of lower-inner quadrant of right female breast: Secondary | ICD-10-CM

## 2015-02-18 DIAGNOSIS — Z803 Family history of malignant neoplasm of breast: Secondary | ICD-10-CM

## 2015-02-18 DIAGNOSIS — Z5111 Encounter for antineoplastic chemotherapy: Secondary | ICD-10-CM | POA: Diagnosis not present

## 2015-02-18 DIAGNOSIS — G620 Drug-induced polyneuropathy: Secondary | ICD-10-CM | POA: Insufficient documentation

## 2015-02-18 DIAGNOSIS — K900 Celiac disease: Secondary | ICD-10-CM

## 2015-02-18 DIAGNOSIS — Z170 Estrogen receptor positive status [ER+]: Secondary | ICD-10-CM

## 2015-02-18 LAB — CBC WITH DIFFERENTIAL/PLATELET
BASO%: 1.2 % (ref 0.0–2.0)
BASOS ABS: 0.1 10*3/uL (ref 0.0–0.1)
EOS ABS: 0.2 10*3/uL (ref 0.0–0.5)
EOS%: 3.9 % (ref 0.0–7.0)
HEMATOCRIT: 30.7 % — AB (ref 34.8–46.6)
HEMOGLOBIN: 10.3 g/dL — AB (ref 11.6–15.9)
LYMPH%: 10.8 % — AB (ref 14.0–49.7)
MCH: 32.7 pg (ref 25.1–34.0)
MCHC: 33.5 g/dL (ref 31.5–36.0)
MCV: 97.5 fL (ref 79.5–101.0)
MONO#: 0.3 10*3/uL (ref 0.1–0.9)
MONO%: 7.1 % (ref 0.0–14.0)
NEUT%: 77 % — ABNORMAL HIGH (ref 38.4–76.8)
NEUTROS ABS: 3.2 10*3/uL (ref 1.5–6.5)
PLATELETS: 212 10*3/uL (ref 145–400)
RBC: 3.15 10*6/uL — ABNORMAL LOW (ref 3.70–5.45)
RDW: 14.8 % — ABNORMAL HIGH (ref 11.2–14.5)
WBC: 4.1 10*3/uL (ref 3.9–10.3)
lymph#: 0.4 10*3/uL — ABNORMAL LOW (ref 0.9–3.3)

## 2015-02-18 LAB — COMPREHENSIVE METABOLIC PANEL (CC13)
ALBUMIN: 3.7 g/dL (ref 3.5–5.0)
ALT: 26 U/L (ref 0–55)
ANION GAP: 9 meq/L (ref 3–11)
AST: 28 U/L (ref 5–34)
Alkaline Phosphatase: 88 U/L (ref 40–150)
BUN: 11.1 mg/dL (ref 7.0–26.0)
CHLORIDE: 108 meq/L (ref 98–109)
CO2: 27 meq/L (ref 22–29)
CREATININE: 0.7 mg/dL (ref 0.6–1.1)
Calcium: 9.7 mg/dL (ref 8.4–10.4)
EGFR: >90 mL/min/{1.73_m2} (ref >90)
Glucose: 95 mg/dl (ref 70–140)
Potassium: 4.5 mEq/L (ref 3.5–5.1)
Sodium: 144 mEq/L (ref 136–145)
TOTAL PROTEIN: 6.1 g/dL — AB (ref 6.4–8.3)
Total Bilirubin: 0.39 mg/dL (ref 0.20–1.20)

## 2015-02-18 MED ORDER — FAMOTIDINE IN NACL 20-0.9 MG/50ML-% IV SOLN
INTRAVENOUS | Status: AC
  Filled 2015-02-18: qty 50

## 2015-02-18 MED ORDER — PACLITAXEL CHEMO INJECTION 300 MG/50ML
80.0000 mg/m2 | Freq: Once | INTRAVENOUS | Status: AC
  Administered 2015-02-18: 126 mg via INTRAVENOUS
  Filled 2015-02-18: qty 21

## 2015-02-18 MED ORDER — DIPHENHYDRAMINE HCL 50 MG/ML IJ SOLN
INTRAMUSCULAR | Status: AC
  Filled 2015-02-18: qty 1

## 2015-02-18 MED ORDER — SODIUM CHLORIDE 0.9 % IJ SOLN
10.0000 mL | INTRAMUSCULAR | Status: DC | PRN
  Administered 2015-02-18: 10 mL
  Filled 2015-02-18: qty 10

## 2015-02-18 MED ORDER — DEXAMETHASONE SODIUM PHOSPHATE 10 MG/ML IJ SOLN
INTRAMUSCULAR | Status: AC
  Filled 2015-02-18: qty 1

## 2015-02-18 MED ORDER — HEPARIN SOD (PORK) LOCK FLUSH 100 UNIT/ML IV SOLN
500.0000 [IU] | Freq: Once | INTRAVENOUS | Status: AC | PRN
  Administered 2015-02-18: 500 [IU]
  Filled 2015-02-18: qty 5

## 2015-02-18 MED ORDER — LORAZEPAM 2 MG/ML IJ SOLN
0.5000 mg | Freq: Once | INTRAMUSCULAR | Status: AC
  Administered 2015-02-18: 0.5 mg via INTRAVENOUS

## 2015-02-18 MED ORDER — LORAZEPAM 2 MG/ML IJ SOLN
INTRAMUSCULAR | Status: AC
  Filled 2015-02-18: qty 1

## 2015-02-18 MED ORDER — ONDANSETRON 8 MG/50ML IVPB (CHCC)
8.0000 mg | Freq: Once | INTRAVENOUS | Status: AC
  Administered 2015-02-18: 8 mg via INTRAVENOUS

## 2015-02-18 MED ORDER — DIPHENHYDRAMINE HCL 50 MG/ML IJ SOLN
25.0000 mg | Freq: Once | INTRAMUSCULAR | Status: AC
  Administered 2015-02-18: 25 mg via INTRAVENOUS

## 2015-02-18 MED ORDER — FAMOTIDINE IN NACL 20-0.9 MG/50ML-% IV SOLN
20.0000 mg | Freq: Once | INTRAVENOUS | Status: AC
  Administered 2015-02-18: 20 mg via INTRAVENOUS

## 2015-02-18 MED ORDER — ONDANSETRON 8 MG/NS 50 ML IVPB
INTRAVENOUS | Status: AC
  Filled 2015-02-18: qty 8

## 2015-02-18 MED ORDER — DEXAMETHASONE SODIUM PHOSPHATE 10 MG/ML IJ SOLN
4.0000 mg | Freq: Once | INTRAMUSCULAR | Status: AC
  Administered 2015-02-18: 4 mg via INTRAVENOUS

## 2015-02-18 MED ORDER — SODIUM CHLORIDE 0.9 % IV SOLN
Freq: Once | INTRAVENOUS | Status: AC
  Administered 2015-02-18: 11:00:00 via INTRAVENOUS

## 2015-02-18 NOTE — Patient Instructions (Signed)
[facility] Discharge Instructions for Patients Receiving Chemotherapy  Today you received the following chemotherapy agent: Taxol   To help prevent nausea and vomiting after your treatment, we encourage you to take your nausea medication as prescribed.    If you develop nausea and vomiting that is not controlled by your nausea medication, call the clinic.   BELOW ARE SYMPTOMS THAT SHOULD BE REPORTED IMMEDIATELY:  *FEVER GREATER THAN 100.5 F  *CHILLS WITH OR WITHOUT FEVER  NAUSEA AND VOMITING THAT IS NOT CONTROLLED WITH YOUR NAUSEA MEDICATION  *UNUSUAL SHORTNESS OF BREATH  *UNUSUAL BRUISING OR BLEEDING  TENDERNESS IN MOUTH AND THROAT WITH OR WITHOUT PRESENCE OF ULCERS  *URINARY PROBLEMS  *BOWEL PROBLEMS  UNUSUAL RASH Items with * indicate a potential emergency and should be followed up as soon as possible.  Feel free to call the clinic you have any questions or concerns. The clinic phone number is [phone].

## 2015-02-18 NOTE — Telephone Encounter (Signed)
Per staff message and POF I have scheduled appts. Advised scheduler of appts. JMW  

## 2015-02-18 NOTE — Progress Notes (Signed)
[place]  Telephone:(336) [phone] Fax:(336) [phone]     ID: Misty [name] DOB: 06/28/1976  MR#: [id number]  TDV#:[id number]  Patient Care Team: Glenda Chroman, MD as PCP - General (Internal Medicine) Fanny Skates, MD as Consulting Physician (General Surgery) Chauncey Cruel, MD as Consulting Physician (Oncology) PCP: Glenda Chroman., MD Santo Held, MD (GYN) Crissie Reese M.D. (Plastics)  CHIEF COMPLAINT: Estrogen receptor positive breast cancer  CURRENT TREATMENT: adjuvant chemotherapy    BREAST CANCER HISTORY: From the original intake note:  Misty [name] was closely followed for some right breast changes between 20 11/12/2012. Everything seemed stable at that time. In 2014 she was promoted in her job, was working very hard, and actually missed having a mammogram. Late in January 2015 she noted some pain in her right breast and by February when she lifted her arms she noted that the nipple was tilting sideways and seemed a little bit of gathered. She didn't think much of this however. It was not until July that she noted a palpable mass in the lateral right breast. She brought this to her gynecologist's attention and she was set up for diagnostic mammography at [facility] 08/04/2014.   There was an area of distortion in the lateral aspect of the right breast noted on mammography,  corresponding to a palpable firm mass at the 8:00 position of the right breast. Ultrasound confirmed a 2.4 cm irregular hypoechoic spiculated mass in the right breast at the 8:00 position and in addition at the 5:00 position 1 cm from the nipple there was a taller than wide irregular hypoechoic mass measuring 0.8 cm. There was no right axillary adenopathy. In the left breast upper-outer quadrant there was a minimally complicated cyst measuring 0.7 cm.  On 08/11/2014 the patient underwent biopsy of the 2 right breast masses noted on ultrasound. Both showed invasive ductal carcinoma, grade 1,  estrogen receptor 90% positive, progesterone receptor 90% positive, both with strong staining intensity, HER-2 equivocal at 2+ but negative by FISH, with the signals ratio being 1.20 and the number per nucleus 2.1. The proliferation fraction by MIB-1 was 17% ([id number]; this report was reviewed by our pathologist, who concurred, [name] [phone]).  On 08/25/2014 the patient underwent bilateral breast MRI at [facility]. This showed, in the right breast, an irregular enhancing mass at the 8:00 position measuring 2.7 cm. Also in the right breast at the 6:00 position there was another enhancing mass measuring 0.6 cm, located 2 cm away from the larger mass. There were also masses in the retroareolar position measuring 0.7 cm, in the posterior central right breast measuring 0.5 cm, and another one just inferior to the midline measuring 5.5 cm. There were no masses or findings of concern in the left breast and no abnormal appearing lymph nodes.  The patient's subsequent history is as detailed below  INTERVAL HISTORY: Misty [name] returns today for follow up of her breast cancer, accompanied by her mother. Today is day 1 cycle 5 of 12 planned weekly doses of paclitaxel.    REVIEW OF SYSTEMS: Misty [name] denies fevers, chills, or changes in bowel or bladder habits. She takes a stool softener PRN to stay regular. She has some mild nausea managed with zofran PRN. Her appetite is healthy and she is gaining weight. She has some mild fatigue but can work through it. She has some brief tinging to her bilateral index finger and thumbs but this comes and goes. She had blisters to her great toes, but these have popped,  peeled, and have healed now. A detailed review of systems is otherwise stable.  PAST MEDICAL HISTORY: Past Medical History  Diagnosis Date  . Thyroid disease   . Celiac disease   . PONV (postoperative nausea and vomiting)   . Hypothyroidism   . Anxiety   . Headache(784.0)   . Cancer     breast cancer     PAST SURGICAL HISTORY: Past Surgical History  Procedure Laterality Date  . Cesarean section    . Gallbladder surgery    . Cholecystectomy    . Simple mastectomy with axillary sentinel node biopsy Right 09/28/2014    Procedure: RIGHT TOTAL MASTECTOMY, RIGHT  AXILLARY SENTINEL NODE BIOPSY;  Surgeon: Fanny Skates, MD;  Location: [place];  Service: General;  Laterality: Right;  . Portacath placement Right 10/22/2014    Procedure: INSERTION PORT-A-CATH;  Surgeon: Fanny Skates, MD;  Location: [place];  Service: General;  Laterality: Right;    FAMILY HISTORY Family History  Problem Relation Age of Onset  . Skin cancer Father   . Breast cancer Maternal Grandmother     dx unknown age  . Breast cancer Other 53    mat great aunt through [facility] with breast cancer   the patient's parents are both living, in their late 62s. The patient had one brother, no sisters. The patient's maternal grandmother was diagnosed with breast cancer in her late 42s. One of that grandmother's sisters was also diagnosed with breast cancer, in her 73s. There is no other history of breast or ovarian cancer in the family  GYNECOLOGIC HISTORY:  No LMP recorded. Menarche age 16, first live birth age 2, the patient is GX P1. 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 Her husband, Misty [name], works as a Engineer, structural in [place]. Their son Misty [name] is 71. The patient attends a local [place]: Not in place   HEALTH MAINTENANCE: History  Substance Use Topics  . Smoking status: Never Smoker   . Smokeless tobacco: Not on file  . Alcohol Use: No     Colonoscopy:  PAP: 07/29/2014  Bone density:  Lipid  panel:  Allergies  Allergen Reactions  . Gluten Meal   . Penicillins Nausea And Vomiting    Current Outpatient Prescriptions  Medication Sig Dispense Refill  . levothyroxine (SYNTHROID) 100 MCG tablet Take 1 tablet (100 mcg total) by mouth daily before breakfast. 30 tablet 6  . HYDROcodone-acetaminophen (NORCO) 5-325 MG per tablet Take 1-2 tablets by mouth every 6 (six) hours as needed for moderate pain or severe pain. (Patient not taking: Reported on 12/08/2014) 30 tablet 0  . LORazepam (ATIVAN) 0.5 MG tablet Take 1 tablet (0.5 mg total) by mouth every 6 (six) hours as needed (Nausea or vomiting). (Patient not taking: Reported on 01/21/2015) 30 tablet 0  . ondansetron (ZOFRAN) 8 MG tablet Take 1 tablet (8 mg total) by mouth 2 (two) times daily. Start the day after chemo for 2 days. Then take as needed for nausea or vomiting. (Patient not taking: Reported on 01/06/2015) 30 tablet 1  . prochlorperazine (COMPAZINE) 10 MG tablet Take 1 tablet (10 mg total) by mouth every 6 (six) hours as needed (Nausea or vomiting). (Patient not taking: Reported on  02/18/2015) 30 tablet 1   No current facility-administered medications for this visit.   Facility-Administered Medications Ordered in Other Visits  Medication Dose Route Frequency Provider Last Rate Last Dose  . dexamethasone (DECADRON) injection 4 mg  4 mg Intravenous Once Chauncey Cruel, MD      . diphenhydrAMINE (BENADRYL) injection 25 mg  25 mg Intravenous Once Chauncey Cruel, MD      . famotidine (PEPCID) IVPB 20 mg  20 mg Intravenous Once Chauncey Cruel, MD      . heparin lock flush 100 unit/mL  500 Units Intracatheter Once PRN Chauncey Cruel, MD      . ondansetron (ZOFRAN) IVPB 8 mg  8 mg Intravenous Once Chauncey Cruel, MD      . PACLitaxel (TAXOL) 126 mg in dextrose 5 % 250 mL chemo infusion (</= 65m/m2)  80 mg/m2 (Treatment Plan Actual) Intravenous Once GChauncey Cruel MD      . sodium chloride 0.9 % injection 10 mL  10 mL  Intracatheter PRN GChauncey Cruel MD        OBJECTIVE: Middle-aged white woman who appears stated age F65Vitals:   02/18/15 0937  BP: 112/56  Pulse: 76  Temp: 98.5 F (36.9 C)  Resp: 18     Body mass index is 24.86 kg/(m^2).    ECOG FS:0 - Asymptomatic  Skin: warm, dry  HEENT: sclerae anicteric, conjunctivae pink, oropharynx clear. No thrush or mucositis.  Lymph Nodes: No cervical or supraclavicular lymphadenopathy  Lungs: clear to auscultation bilaterally, no rales, wheezes, or rhonci  Heart: regular rate and rhythm  Abdomen: round, soft, non tender, positive bowel sounds  Musculoskeletal: No focal spinal tenderness, no peripheral edema  Neuro: non focal, well oriented, positive affect  Breasts: deferred   LAB RESULTS:  CMP     Component Value Date/Time   NA 144 02/18/2015 0916   NA 142 09/23/2014 1456   K 4.5 02/18/2015 0916   K 3.9 09/23/2014 1456   CL 104 09/23/2014 1456   CO2 27 02/18/2015 0916   CO2 27 09/23/2014 1456   GLUCOSE 95 02/18/2015 0916   GLUCOSE 71 09/23/2014 1456   BUN 11.1 02/18/2015 0916   BUN 11 09/23/2014 1456   CREATININE 0.7 02/18/2015 0916   CREATININE 0.70 09/23/2014 1456   CALCIUM 9.7 02/18/2015 0916   CALCIUM 9.8 09/23/2014 1456   PROT 6.1* 02/18/2015 0916   PROT 7.7 09/23/2014 1456   ALBUMIN 3.7 02/18/2015 0916   ALBUMIN 4.0 09/23/2014 1456   AST 28 02/18/2015 0916   AST 19 09/23/2014 1456   ALT 26 02/18/2015 0916   ALT 12 09/23/2014 1456   ALKPHOS 88 02/18/2015 0916   ALKPHOS 60 09/23/2014 1456   BILITOT 0.39 02/18/2015 0916   BILITOT 0.5 09/23/2014 1456   GFRNONAA >90 09/23/2014 1456   GFRAA >90 09/23/2014 1456    I No results found for: SPEP  Lab Results  Component Value Date   WBC 4.1 02/18/2015   NEUTROABS 3.2 02/18/2015   HGB 10.3* 02/18/2015   HCT 30.7* 02/18/2015   MCV 97.5 02/18/2015   PLT 212 02/18/2015      Chemistry      Component Value Date/Time   NA 144 02/18/2015 0916   NA 142 09/23/2014  1456   K 4.5 02/18/2015 0916   K 3.9 09/23/2014 1456   CL 104 09/23/2014 1456   CO2 27 02/18/2015 0916   CO2 27 09/23/2014 1456   BUN 11.1  02/18/2015 0916   BUN 11 09/23/2014 1456   CREATININE 0.7 02/18/2015 0916   CREATININE 0.70 09/23/2014 1456      Component Value Date/Time   CALCIUM 9.7 02/18/2015 0916   CALCIUM 9.8 09/23/2014 1456   ALKPHOS 88 02/18/2015 0916   ALKPHOS 60 09/23/2014 1456   AST 28 02/18/2015 0916   AST 19 09/23/2014 1456   ALT 26 02/18/2015 0916   ALT 12 09/23/2014 1456   BILITOT 0.39 02/18/2015 0916   BILITOT 0.5 09/23/2014 1456       No results found for: LABCA2  No components found for: LABCA125  No results for input(s): INR in the last 168 hours.  Urinalysis No results found for: COLORURINE  STUDIES: No results found.  ASSESSMENT: 39 y.o. BRCA negative [place], [place] woman status post biopsy of 2 separate right breast masses 08/11/2014, both positive for an invasive ductal carcinoma, grade 1, estrogen and progesterone receptor strongly positive, with an MIB-1 of 19%, and HER-2 nonamplified  (1) genetics testing sent 08/26/2014, showing no mutations in the BRCA genes; there were also no mutations in ATM, BARD 1, BRIP1, CDH 1, CHE K2, MR E11A, M UT YH, N BN, NF1, PA L B2, PTE N, RAD 50, RAD 50 1C, RAD 50 1D, and T p53.  (2) status post right mastectomy with sentinel lymph node sampling 09/28/2014 for an mpT2 pN1, stage IIB invasive breast cancer (with both ductal and lobular features), grade 2, with negative margins  (3) adjuvant chemotherapy to consist of doxorubicin and cyclophosphamide in dose dense fashion 4, completed 12/30/2014, followed by paclitaxel weekly x 12 started 01/21/2015.  (4) radiation to follow chemotherapy.  (5) anti-estrogens to follow radiation  (6) history of celiac disease  PLAN: Misty [name] is doing well today. She is demonstrating some neuropathy symptoms, but at this time they are light and brief. She will continue  with cycle 5 of paclitaxel as planned. If the symptoms continue we may need to take a week off of treatment to see if they resolve.   Misty [name] will return next week for labs, an office visit, and cycle 6 of paclitaxel. She understands agrees with this plan. She knows the goal of treatment in her case is cure. She has been encouraged to call with any issues that might arise before her next visit here.  Frutoso Chase, Sharlette Dense, NP   02/18/2015 10:52 AM

## 2015-02-18 NOTE — Telephone Encounter (Signed)
per pof to sch pt appt-sent MW email to sch trmt-gave pt copy of sch

## 2015-02-19 ENCOUNTER — Ambulatory Visit: Payer: BLUE CROSS/BLUE SHIELD

## 2015-02-25 ENCOUNTER — Ambulatory Visit (HOSPITAL_BASED_OUTPATIENT_CLINIC_OR_DEPARTMENT_OTHER): Payer: BLUE CROSS/BLUE SHIELD | Admitting: Nurse Practitioner

## 2015-02-25 ENCOUNTER — Other Ambulatory Visit (HOSPITAL_BASED_OUTPATIENT_CLINIC_OR_DEPARTMENT_OTHER): Payer: BLUE CROSS/BLUE SHIELD

## 2015-02-25 ENCOUNTER — Encounter: Payer: Self-pay | Admitting: Nurse Practitioner

## 2015-02-25 ENCOUNTER — Ambulatory Visit: Payer: BLUE CROSS/BLUE SHIELD

## 2015-02-25 VITALS — BP 114/69 | HR 83 | Temp 98.0°F | Resp 18 | Ht 61.0 in | Wt 131.8 lb

## 2015-02-25 DIAGNOSIS — J3489 Other specified disorders of nose and nasal sinuses: Secondary | ICD-10-CM | POA: Diagnosis not present

## 2015-02-25 DIAGNOSIS — G620 Drug-induced polyneuropathy: Secondary | ICD-10-CM | POA: Diagnosis not present

## 2015-02-25 DIAGNOSIS — C50311 Malignant neoplasm of lower-inner quadrant of right female breast: Secondary | ICD-10-CM | POA: Diagnosis not present

## 2015-02-25 DIAGNOSIS — C50511 Malignant neoplasm of lower-outer quadrant of right female breast: Secondary | ICD-10-CM

## 2015-02-25 DIAGNOSIS — K900 Celiac disease: Secondary | ICD-10-CM

## 2015-02-25 DIAGNOSIS — Z803 Family history of malignant neoplasm of breast: Secondary | ICD-10-CM

## 2015-02-25 LAB — CBC WITH DIFFERENTIAL/PLATELET
BASO%: 0.7 % (ref 0.0–2.0)
BASOS ABS: 0 10*3/uL (ref 0.0–0.1)
EOS%: 4 % (ref 0.0–7.0)
Eosinophils Absolute: 0.2 10*3/uL (ref 0.0–0.5)
HEMATOCRIT: 30.8 % — AB (ref 34.8–46.6)
HGB: 10.6 g/dL — ABNORMAL LOW (ref 11.6–15.9)
LYMPH%: 14.8 % (ref 14.0–49.7)
MCH: 32.7 pg (ref 25.1–34.0)
MCHC: 34.4 g/dL (ref 31.5–36.0)
MCV: 95.1 fL (ref 79.5–101.0)
MONO#: 0.4 10*3/uL (ref 0.1–0.9)
MONO%: 8.2 % (ref 0.0–14.0)
NEUT#: 3.3 10*3/uL (ref 1.5–6.5)
NEUT%: 72.3 % (ref 38.4–76.8)
Platelets: 212 10*3/uL (ref 145–400)
RBC: 3.24 10*6/uL — ABNORMAL LOW (ref 3.70–5.45)
RDW: 14.1 % (ref 11.2–14.5)
WBC: 4.5 10*3/uL (ref 3.9–10.3)
lymph#: 0.7 10*3/uL — ABNORMAL LOW (ref 0.9–3.3)

## 2015-02-25 LAB — COMPREHENSIVE METABOLIC PANEL (CC13)
ALK PHOS: 86 U/L (ref 40–150)
ALT: 19 U/L (ref 0–55)
AST: 20 U/L (ref 5–34)
Albumin: 3.8 g/dL (ref 3.5–5.0)
Anion Gap: 11 mEq/L (ref 3–11)
BUN: 14 mg/dL (ref 7.0–26.0)
CALCIUM: 9.4 mg/dL (ref 8.4–10.4)
CHLORIDE: 107 meq/L (ref 98–109)
CO2: 24 mEq/L (ref 22–29)
CREATININE: 0.7 mg/dL (ref 0.6–1.1)
EGFR: >90 mL/min/{1.73_m2} (ref >90)
Glucose: 102 mg/dl (ref 70–140)
POTASSIUM: 4.1 meq/L (ref 3.5–5.1)
Sodium: 141 mEq/L (ref 136–145)
Total Bilirubin: 0.42 mg/dL (ref 0.20–1.20)
Total Protein: 6.2 g/dL — ABNORMAL LOW (ref 6.4–8.3)

## 2015-02-25 NOTE — Progress Notes (Signed)
[place]  Telephone:(336) [phone] Fax:(336) [phone]     ID: Misty [name] DOB: January 20, 1976  MR#: [id number]  MWN#:[id number]  Patient Care Team: Glenda Chroman, MD as PCP - General (Internal Medicine) Fanny Skates, MD as Consulting Physician (General Surgery) Chauncey Cruel, MD as Consulting Physician (Oncology) PCP: Glenda Chroman., MD Santo Held, MD (GYN) Crissie Reese M.D. (Plastics)  CHIEF COMPLAINT: Estrogen receptor positive breast cancer  CURRENT TREATMENT: adjuvant chemotherapy    BREAST CANCER HISTORY: From the original intake note:  [name] was closely followed for some right breast changes between 20 11/12/2012. Everything seemed stable at that time. In 2014 she was promoted in her job, was working very hard, and actually missed having a mammogram. Late in January 2015 she noted some pain in her right breast and by February when she lifted her arms she noted that the nipple was tilting sideways and seemed a little bit of gathered. She didn't think much of this however. It was not until July that she noted a palpable mass in the lateral right breast. She brought this to her gynecologist's attention and she was set up for diagnostic mammography at [facility] 08/04/2014.   There was an area of distortion in the lateral aspect of the right breast noted on mammography,  corresponding to a palpable firm mass at the 8:00 position of the right breast. Ultrasound confirmed a 2.4 cm irregular hypoechoic spiculated mass in the right breast at the 8:00 position and in addition at the 5:00 position 1 cm from the nipple there was a taller than wide irregular hypoechoic mass measuring 0.8 cm. There was no right axillary adenopathy. In the left breast upper-outer quadrant there was a minimally complicated cyst measuring 0.7 cm.  On 08/11/2014 the patient underwent biopsy of the 2 right breast masses noted on ultrasound. Both showed invasive ductal carcinoma, grade 1,  estrogen receptor 90% positive, progesterone receptor 90% positive, both with strong staining intensity, HER-2 equivocal at 2+ but negative by FISH, with the signals ratio being 1.20 and the number per nucleus 2.1. The proliferation fraction by MIB-1 was 17% ([id number]; this report was reviewed by our pathologist, who concurred, [name] [phone]).  On 08/25/2014 the patient underwent bilateral breast MRI at [facility]. This showed, in the right breast, an irregular enhancing mass at the 8:00 position measuring 2.7 cm. Also in the right breast at the 6:00 position there was another enhancing mass measuring 0.6 cm, located 2 cm away from the larger mass. There were also masses in the retroareolar position measuring 0.7 cm, in the posterior central right breast measuring 0.5 cm, and another one just inferior to the midline measuring 5.5 cm. There were no masses or findings of concern in the left breast and no abnormal appearing lymph nodes.  The patient's subsequent history is as detailed below  INTERVAL HISTORY: Misty [name] returns today for follow up of her breast cancer, accompanied by her mother. Today is day 1 cycle 6 of 12 planned weekly doses of paclitaxel. 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 The numbness to her fingertips is still present today. She also complains of blood tinged drainage from her nose, left cheek numbness, right ear ringing, and right eye twitching.   REVIEW OF SYSTEMS: Gennesis denies fevers, chills, or changes in bowel or bladder habits. She takes a stool softener PRN to stay regular. She has some mild  nausea managed with zofran PRN. Her appetite is healthy and she is gaining weight. She has some mild fatigue but can work through it. She complains of low back pain and takes tylenol sometimes for this. She has some achyness to her legs and swelling at the beginning of the week but is resolving  now. A detailed review of systems is otherwise stable.  PAST MEDICAL HISTORY: Past Medical History  Diagnosis Date  . Thyroid disease   . Celiac disease   . PONV (postoperative nausea and vomiting)   . Hypothyroidism   . Anxiety   . Headache(784.0)   . Cancer     breast cancer    PAST SURGICAL HISTORY: Past Surgical History  Procedure Laterality Date  . Cesarean section    . Gallbladder surgery    . Cholecystectomy    . Simple mastectomy with axillary sentinel node biopsy Right 09/28/2014    Procedure: RIGHT TOTAL MASTECTOMY, RIGHT  AXILLARY SENTINEL NODE BIOPSY;  Surgeon: Fanny Skates, MD;  Location: [place];  Service: General;  Laterality: Right;  . Portacath placement Right 10/22/2014    Procedure: INSERTION PORT-A-CATH;  Surgeon: Fanny Skates, MD;  Location: [place];  Service: General;  Laterality: Right;    FAMILY HISTORY Family History  Problem Relation Age of Onset  . Skin cancer Father   . Breast cancer Maternal Grandmother     dx unknown age  . Breast cancer Other 92    mat great aunt through [facility] with breast cancer   the patient's parents are both living, in their late 10s. The patient had one brother, no sisters. The patient's maternal grandmother was diagnosed with breast cancer in her late 32s. One of that grandmother's sisters was also diagnosed with breast cancer, in her 38s. There is no other history of breast or ovarian cancer in the family  GYNECOLOGIC HISTORY:  No LMP recorded. Menarche age 20, first live birth age 29, the patient is GX P1. 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 Her husband, Misty [name], works as a Engineer, structural in [place]. Their son Misty [name] is 69.  The patient attends a local [place]: Not in place   HEALTH MAINTENANCE: History  Substance Use Topics  . Smoking status: Never Smoker   . Smokeless tobacco: Not on file  . Alcohol Use: No     Colonoscopy:  PAP: 07/29/2014  Bone density:  Lipid panel:  Allergies  Allergen Reactions  . Gluten Meal   . Penicillins Nausea And Vomiting    Current Outpatient Prescriptions  Medication Sig Dispense Refill  . levothyroxine (SYNTHROID) 100 MCG tablet Take 1 tablet (100 mcg total) by mouth daily before breakfast. 30 tablet 6  . HYDROcodone-acetaminophen (NORCO) 5-325 MG per tablet Take 1-2 tablets by mouth every 6 (six) hours as needed for moderate pain or severe pain. (Patient not taking: Reported on 12/08/2014) 30 tablet 0  . LORazepam (ATIVAN) 0.5 MG tablet Take 1 tablet (0.5 mg total) by mouth every 6 (six) hours as needed (Nausea or vomiting). (Patient not taking: Reported on 01/21/2015) 30 tablet 0  . ondansetron (ZOFRAN) 8 MG tablet Take 1 tablet (8 mg total) by mouth 2 (two) times daily. Start the day after  chemo for 2 days. Then take as needed for nausea or vomiting. (Patient not taking: Reported on 02/25/2015) 30 tablet 1  . prochlorperazine (COMPAZINE) 10 MG tablet Take 1 tablet (10 mg total) by mouth every 6 (six) hours as needed (Nausea or vomiting). (Patient not taking: Reported on 02/18/2015) 30 tablet 1   No current facility-administered medications for this visit.    OBJECTIVE: Middle-aged white woman who appears stated age 22 Vitals:   02/25/15 1400  BP: 114/69  Pulse: 83  Temp: 98 F (36.7 C)  Resp: 18     Body mass index is 24.92 kg/(m^2).    ECOG FS:0 - Asymptomatic  Sclerae unicteric, pupils equal and reactive Oropharynx clear and moist-- no thrush No cervical or supraclavicular adenopathy Lungs no rales or rhonchi Heart regular rate and rhythm Abd soft, nontender, positive bowel sounds MSK no focal spinal tenderness, no upper  extremity lymphedema Neuro: nonfocal, well oriented, appropriate affect Breasts: deferred  LAB RESULTS:  CMP     Component Value Date/Time   NA 141 02/25/2015 1332   NA 142 09/23/2014 1456   K 4.1 02/25/2015 1332   K 3.9 09/23/2014 1456   CL 104 09/23/2014 1456   CO2 24 02/25/2015 1332   CO2 27 09/23/2014 1456   GLUCOSE 102 02/25/2015 1332   GLUCOSE 71 09/23/2014 1456   BUN 14.0 02/25/2015 1332   BUN 11 09/23/2014 1456   CREATININE 0.7 02/25/2015 1332   CREATININE 0.70 09/23/2014 1456   CALCIUM 9.4 02/25/2015 1332   CALCIUM 9.8 09/23/2014 1456   PROT 6.2* 02/25/2015 1332   PROT 7.7 09/23/2014 1456   ALBUMIN 3.8 02/25/2015 1332   ALBUMIN 4.0 09/23/2014 1456   AST 20 02/25/2015 1332   AST 19 09/23/2014 1456   ALT 19 02/25/2015 1332   ALT 12 09/23/2014 1456   ALKPHOS 86 02/25/2015 1332   ALKPHOS 60 09/23/2014 1456   BILITOT 0.42 02/25/2015 1332   BILITOT 0.5 09/23/2014 1456   GFRNONAA >90 09/23/2014 1456   GFRAA >90 09/23/2014 1456    I No results found for: SPEP  Lab Results  Component Value Date   WBC 4.5 02/25/2015   NEUTROABS 3.3 02/25/2015   HGB 10.6* 02/25/2015   HCT 30.8* 02/25/2015   MCV 95.1 02/25/2015   PLT 212 02/25/2015      Chemistry      Component Value Date/Time   NA 141 02/25/2015 1332   NA 142 09/23/2014 1456   K 4.1 02/25/2015 1332   K 3.9 09/23/2014 1456   CL 104 09/23/2014 1456   CO2 24 02/25/2015 1332   CO2 27 09/23/2014 1456   BUN 14.0 02/25/2015 1332   BUN 11 09/23/2014 1456   CREATININE 0.7 02/25/2015 1332   CREATININE 0.70 09/23/2014 1456      Component Value Date/Time   CALCIUM 9.4 02/25/2015 1332   CALCIUM 9.8 09/23/2014 1456   ALKPHOS 86 02/25/2015 1332   ALKPHOS 60 09/23/2014 1456   AST 20 02/25/2015 1332   AST 19 09/23/2014 1456   ALT 19 02/25/2015 1332   ALT 12 09/23/2014 1456   BILITOT 0.42 02/25/2015 1332   BILITOT 0.5 09/23/2014 1456       No results found for: LABCA2  No components found for:  LABCA125  No results for input(s): INR in the last 168 hours.  Urinalysis No results found for: COLORURINE  STUDIES: No results found.  ASSESSMENT: 39 y.o. BRCA negative [place], [place] woman status post biopsy of 2 separate  right breast masses 08/11/2014, both positive for an invasive ductal carcinoma, grade 1, estrogen and progesterone receptor strongly positive, with an MIB-1 of 19%, and HER-2 nonamplified  (1) genetics testing sent 08/26/2014, showing no mutations in the BRCA genes; there were also no mutations in ATM, BARD 1, BRIP1, CDH 1, CHE K2, MR E11A, M UT YH, N BN, NF1, PA L B2, PTE N, RAD 50, RAD 50 1C, RAD 50 1D, and T p53.  (2) status post right mastectomy with sentinel lymph node sampling 09/28/2014 for an mpT2 pN1, stage IIB invasive breast cancer (with both ductal and lobular features), grade 2, with negative margins  (3) adjuvant chemotherapy to consist of doxorubicin and cyclophosphamide in dose dense fashion 4, completed 12/30/2014, followed by paclitaxel weekly x 12 started 01/21/2015.  (4) radiation to follow chemotherapy.  (5) anti-estrogens to follow radiation  (6) history of celiac disease  PLAN: Due to the progression of her neuropathy, we will hold treatment today. I consulted with Dr. Jana Hakim, and should symptoms continue at this level next week we will stop chemotherapy altogether. I consulted with the patient and she would like a dose or two more if possible, but understands the risks of permanent nerve damage. She will be ok with stopping paclitaxel next week if necessary.   I encouraged her to use saline nasal spray for her dry nose. she will start claratin daily x 1 week to see if this helps reduce any sinus pressure that may be causing the left cheek numbness and right ear ringing.   Marnita will return next week for labs and a follow up visit. She understands and agrees with this plan. She knows the goal of treatment in her case is cure. She has been  encouraged to call with any issues that might arise before her next visit here.  Marcelino Duster, NP   02/25/2015 2:35 PM

## 2015-02-26 ENCOUNTER — Ambulatory Visit: Payer: BLUE CROSS/BLUE SHIELD

## 2015-03-04 ENCOUNTER — Telehealth: Payer: Self-pay | Admitting: Nurse Practitioner

## 2015-03-04 ENCOUNTER — Ambulatory Visit: Payer: BLUE CROSS/BLUE SHIELD

## 2015-03-04 ENCOUNTER — Encounter: Payer: Self-pay | Admitting: Nurse Practitioner

## 2015-03-04 ENCOUNTER — Ambulatory Visit (HOSPITAL_BASED_OUTPATIENT_CLINIC_OR_DEPARTMENT_OTHER): Payer: BLUE CROSS/BLUE SHIELD | Admitting: Nurse Practitioner

## 2015-03-04 ENCOUNTER — Other Ambulatory Visit (HOSPITAL_BASED_OUTPATIENT_CLINIC_OR_DEPARTMENT_OTHER): Payer: BLUE CROSS/BLUE SHIELD

## 2015-03-04 VITALS — BP 113/57 | HR 80 | Temp 98.2°F | Resp 18 | Ht 61.0 in | Wt 133.0 lb

## 2015-03-04 DIAGNOSIS — C50511 Malignant neoplasm of lower-outer quadrant of right female breast: Secondary | ICD-10-CM

## 2015-03-04 DIAGNOSIS — T451X5A Adverse effect of antineoplastic and immunosuppressive drugs, initial encounter: Secondary | ICD-10-CM

## 2015-03-04 DIAGNOSIS — C50311 Malignant neoplasm of lower-inner quadrant of right female breast: Secondary | ICD-10-CM

## 2015-03-04 DIAGNOSIS — G620 Drug-induced polyneuropathy: Secondary | ICD-10-CM

## 2015-03-04 DIAGNOSIS — Z803 Family history of malignant neoplasm of breast: Secondary | ICD-10-CM

## 2015-03-04 DIAGNOSIS — Z170 Estrogen receptor positive status [ER+]: Secondary | ICD-10-CM

## 2015-03-04 DIAGNOSIS — K900 Celiac disease: Secondary | ICD-10-CM

## 2015-03-04 LAB — COMPREHENSIVE METABOLIC PANEL (CC13)
ALBUMIN: 3.7 g/dL (ref 3.5–5.0)
ALT: 22 U/L (ref 0–55)
AST: 22 U/L (ref 5–34)
Alkaline Phosphatase: 87 U/L (ref 40–150)
Anion Gap: 8 mEq/L (ref 3–11)
BUN: 8.9 mg/dL (ref 7.0–26.0)
CO2: 26 mEq/L (ref 22–29)
Calcium: 9.6 mg/dL (ref 8.4–10.4)
Chloride: 108 mEq/L (ref 98–109)
Creatinine: 0.7 mg/dL (ref 0.6–1.1)
EGFR: >90 mL/min/{1.73_m2} (ref >90)
Glucose: 91 mg/dl (ref 70–140)
Potassium: 4.4 mEq/L (ref 3.5–5.1)
Sodium: 142 mEq/L (ref 136–145)
Total Bilirubin: 0.35 mg/dL (ref 0.20–1.20)
Total Protein: 6.2 g/dL — ABNORMAL LOW (ref 6.4–8.3)

## 2015-03-04 LAB — CBC WITH DIFFERENTIAL/PLATELET
BASO%: 0.9 % (ref 0.0–2.0)
BASOS ABS: 0.1 10*3/uL (ref 0.0–0.1)
EOS ABS: 0.2 10*3/uL (ref 0.0–0.5)
EOS%: 2.9 % (ref 0.0–7.0)
HCT: 33.4 % — ABNORMAL LOW (ref 34.8–46.6)
HEMOGLOBIN: 10.8 g/dL — AB (ref 11.6–15.9)
LYMPH%: 12.1 % — ABNORMAL LOW (ref 14.0–49.7)
MCH: 31.1 pg (ref 25.1–34.0)
MCHC: 32.5 g/dL (ref 31.5–36.0)
MCV: 95.7 fL (ref 79.5–101.0)
MONO#: 0.6 10*3/uL (ref 0.1–0.9)
MONO%: 9.9 % (ref 0.0–14.0)
NEUT%: 74.2 % (ref 38.4–76.8)
NEUTROS ABS: 4.4 10*3/uL (ref 1.5–6.5)
PLATELETS: 234 10*3/uL (ref 145–400)
RBC: 3.49 10*6/uL — ABNORMAL LOW (ref 3.70–5.45)
RDW: 13.9 % (ref 11.2–14.5)
WBC: 5.9 10*3/uL (ref 3.9–10.3)
lymph#: 0.7 10*3/uL — ABNORMAL LOW (ref 0.9–3.3)

## 2015-03-04 NOTE — Progress Notes (Signed)
Misty [name]  [phone] Fax:([phone]     ID: [name] DOB: 09/12/1976  MR#: [id number]  YBW#:[id number]  Patient Care Team: Glenda Chroman, MD as PCP - General (Internal Medicine) Fanny Skates, MD as Consulting Physician (General Surgery) Chauncey Cruel, MD as Consulting Physician (Oncology) PCP: Glenda Chroman., MD Santo Held, MD (GYN) Crissie Reese M.D. (Plastics)  CHIEF COMPLAINT: Estrogen receptor positive breast cancer  CURRENT TREATMENT: adjuvant chemotherapy    BREAST CANCER HISTORY: From the original intake note:  Marixa was closely followed for some right breast changes between 20 11/12/2012. Everything seemed stable at that time. In 2014 she was promoted in her job, was working very hard, and actually missed having a mammogram. Late in January 2015 she noted some pain in her right breast and by February when she lifted her arms she noted that the nipple was tilting sideways and seemed a little bit of gathered. She didn't think much of this however. It was not until July that she noted a palpable mass in the lateral right breast. She brought this to her gynecologist's attention and she was set up for diagnostic mammography at [facility] 08/04/2014.   There was an area of distortion in the lateral aspect of the right breast noted on mammography,  corresponding to a palpable firm mass at the 8:00 position of the right breast. Ultrasound confirmed a 2.4 cm irregular hypoechoic spiculated mass in the right breast at the 8:00 position and in addition at the 5:00 position 1 cm from the nipple there was a taller than wide irregular hypoechoic mass measuring 0.8 cm. There was no right axillary adenopathy. In the left breast upper-outer quadrant there was a minimally complicated cyst measuring 0.7 cm.  On 08/11/2014 the patient underwent biopsy of the 2 right breast masses noted on ultrasound. Both showed invasive ductal carcinoma, grade 1,  estrogen receptor 90% positive, progesterone receptor 90% positive, both with strong staining intensity, HER-2 equivocal at 2+ but negative by FISH, with the signals ratio being 1.20 and the number per nucleus 2.1. The proliferation fraction by MIB-1 was 17% ([id number]; this report was reviewed by our pathologist, who concurred, [name] [phone]).  On 08/25/2014 the patient underwent bilateral breast MRI at [place]. This showed, in the right breast, an irregular enhancing mass at the 8:00 position measuring 2.7 cm. Also in the right breast at the 6:00 position there was another enhancing mass measuring 0.6 cm, located 2 cm away from the larger mass. There were also masses in the retroareolar position measuring 0.7 cm, in the posterior central right breast measuring 0.5 cm, and another one just inferior to the midline measuring 5.5 cm. There were no masses or findings of concern in the left breast and no abnormal appearing lymph nodes.  The patient's subsequent history is as detailed below  INTERVAL HISTORY: Misty [name] returns today for follow up of her breast cancer, accompanied by her husband. Today is day 1 cycle 6 of 12 planned weekly doses of paclitaxel. Last week the dose was held because of progressive neuropathy. This week the numbness comes and goes in her feet, but it is still persistently present in her fingertips.  REVIEW OF SYSTEMS: Madalena denies fevers, chills, nausea, or vomiting. She takes a stool softener PRN to stay regular. Her appetite is healthy. She continues to gain weight and her legs and ankles have been increasingly swollen. She worked a few 10 hr shifts this week and has been on her feet  or at a desk with her legs in the dependent position. She has been fatigued as a result. She complains of low back pain and takes tylenol sometimes for this. A detailed review of systems is otherwise stable.    PAST MEDICAL HISTORY: Past Medical History  Diagnosis Date  . Thyroid disease     . Celiac disease   . PONV (postoperative nausea and vomiting)   . Hypothyroidism   . Anxiety   . Headache(784.0)   . Cancer     breast cancer    PAST SURGICAL HISTORY: Past Surgical History  Procedure Laterality Date  . Cesarean section    . Gallbladder surgery    . Cholecystectomy    . Simple mastectomy with axillary sentinel node biopsy Right 09/28/2014    Procedure: RIGHT TOTAL MASTECTOMY, RIGHT  AXILLARY SENTINEL NODE BIOPSY;  Surgeon: Fanny Skates, MD;  Location: [place];  Service: General;  Laterality: Right;  . Portacath placement Right 10/22/2014    Procedure: INSERTION PORT-A-CATH;  Surgeon: Fanny Skates, MD;  Location: [place];  Service: General;  Laterality: Right;    FAMILY HISTORY Family History  Problem Relation Age of Onset  . Skin cancer Father   . Breast cancer Maternal Grandmother     dx unknown age  . Breast cancer Other 29    mat great aunt through [facility] with breast cancer   the patient's parents are both living, in their late 44s. The patient had one brother, no sisters. The patient's maternal grandmother was diagnosed with breast cancer in her late 2s. One of that grandmother's sisters was also diagnosed with breast cancer, in her 69s. There is no other history of breast or ovarian cancer in the family  GYNECOLOGIC HISTORY:  No LMP recorded. Menarche age 13, first live birth age 44, the patient is GX P1. 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 Her husband, [name], works as a Engineer, structural in [place]. Their son [name] is 19. The patient attends a local [place]: Not in place   HEALTH MAINTENANCE: History  Substance Use Topics   . Smoking status: Never Smoker   . Smokeless tobacco: Not on file  . Alcohol Use: No     Colonoscopy:  PAP: 07/29/2014  Bone density:  Lipid panel:  Allergies  Allergen Reactions  . Gluten Meal   . Penicillins Nausea And Vomiting    Current Outpatient Prescriptions  Medication Sig Dispense Refill  . levothyroxine (SYNTHROID) 100 MCG tablet Take 1 tablet (100 mcg total) by mouth daily before breakfast. 30 tablet 6  . HYDROcodone-acetaminophen (NORCO) 5-325 MG per tablet Take 1-2 tablets by mouth every 6 (six) hours as needed for moderate pain or severe pain. (Patient not taking: Reported on 12/08/2014) 30 tablet 0  . LORazepam (ATIVAN) 0.5 MG tablet Take 1 tablet (0.5 mg total) by mouth every 6 (six) hours as needed (Nausea or vomiting). (Patient not taking: Reported on 01/21/2015) 30 tablet 0  . ondansetron (ZOFRAN) 8 MG tablet Take 1 tablet (8 mg total) by mouth 2 (two) times daily. Start the day after chemo for 2 days. Then take as needed for nausea or vomiting. (Patient not taking: Reported on 02/25/2015) 30 tablet 1  .  prochlorperazine (COMPAZINE) 10 MG tablet Take 1 tablet (10 mg total) by mouth every 6 (six) hours as needed (Nausea or vomiting). (Patient not taking: Reported on 02/18/2015) 30 tablet 1   No current facility-administered medications for this visit.    OBJECTIVE: Middle-aged white woman who appears stated age 39 Vitals:   03/04/15 1321  BP: 113/57  Pulse: 80  Temp: 98.2 F (36.8 C)  Resp: 18     Body mass index is 25.14 kg/(m^2).    ECOG FS:0 - Asymptomatic  Skin: warm, dry  HEENT: sclerae anicteric, conjunctivae pink, oropharynx clear. No thrush or mucositis.  Lymph Nodes: No cervical or supraclavicular lymphadenopathy  Lungs: clear to auscultation bilaterally, no rales, wheezes, or rhonci  Heart: regular rate and rhythm  Abdomen: round, soft, non tender, positive bowel sounds  Musculoskeletal: No focal spinal tenderness, +2 edema to bilateral lower  extremities  Neuro: non focal, well oriented, positive affect  Breasts: deferred  LAB RESULTS:  CMP     Component Value Date/Time   NA 142 03/04/2015 1235   NA 142 09/23/2014 1456   K 4.4 03/04/2015 1235   K 3.9 09/23/2014 1456   CL 104 09/23/2014 1456   CO2 26 03/04/2015 1235   CO2 27 09/23/2014 1456   GLUCOSE 91 03/04/2015 1235   GLUCOSE 71 09/23/2014 1456   BUN 8.9 03/04/2015 1235   BUN 11 09/23/2014 1456   CREATININE 0.7 03/04/2015 1235   CREATININE 0.70 09/23/2014 1456   CALCIUM 9.6 03/04/2015 1235   CALCIUM 9.8 09/23/2014 1456   PROT 6.2* 03/04/2015 1235   PROT 7.7 09/23/2014 1456   ALBUMIN 3.7 03/04/2015 1235   ALBUMIN 4.0 09/23/2014 1456   AST 22 03/04/2015 1235   AST 19 09/23/2014 1456   ALT 22 03/04/2015 1235   ALT 12 09/23/2014 1456   ALKPHOS 87 03/04/2015 1235   ALKPHOS 60 09/23/2014 1456   BILITOT 0.35 03/04/2015 1235   BILITOT 0.5 09/23/2014 1456   GFRNONAA >90 09/23/2014 1456   GFRAA >90 09/23/2014 1456    I No results found for: SPEP  Lab Results  Component Value Date   WBC 5.9 03/04/2015   NEUTROABS 4.4 03/04/2015   HGB 10.8* 03/04/2015   HCT 33.4* 03/04/2015   MCV 95.7 03/04/2015   PLT 234 03/04/2015      Chemistry      Component Value Date/Time   NA 142 03/04/2015 1235   NA 142 09/23/2014 1456   K 4.4 03/04/2015 1235   K 3.9 09/23/2014 1456   CL 104 09/23/2014 1456   CO2 26 03/04/2015 1235   CO2 27 09/23/2014 1456   BUN 8.9 03/04/2015 1235   BUN 11 09/23/2014 1456   CREATININE 0.7 03/04/2015 1235   CREATININE 0.70 09/23/2014 1456      Component Value Date/Time   CALCIUM 9.6 03/04/2015 1235   CALCIUM 9.8 09/23/2014 1456   ALKPHOS 87 03/04/2015 1235   ALKPHOS 60 09/23/2014 1456   AST 22 03/04/2015 1235   AST 19 09/23/2014 1456   ALT 22 03/04/2015 1235   ALT 12 09/23/2014 1456   BILITOT 0.35 03/04/2015 1235   BILITOT 0.5 09/23/2014 1456       No results found for: LABCA2  No components found for: LABCA125  No  results for input(s): INR in the last 168 hours.  Urinalysis No results found for: COLORURINE  STUDIES: No results found.  ASSESSMENT: 39 y.o. BRCA negative [place], [place] woman status post biopsy of 2 separate right  breast masses 08/11/2014, both positive for an invasive ductal carcinoma, grade 1, estrogen and progesterone receptor strongly positive, with an MIB-1 of 19%, and HER-2 nonamplified  (1) genetics testing sent 08/26/2014, showing no mutations in the BRCA genes; there were also no mutations in ATM, BARD 1, BRIP1, CDH 1, CHE K2, MR E11A, M UT YH, N BN, NF1, PA L B2, PTE N, RAD 50, RAD 50 1C, RAD 50 1D, and T p53.  (2) status post right mastectomy with sentinel lymph node sampling 09/28/2014 for an mpT2 pN1, stage IIB invasive breast cancer (with both ductal and lobular features), grade 2, with negative margins  (3) adjuvant chemotherapy to consist of doxorubicin and cyclophosphamide in dose dense fashion 4, completed 12/30/2014, followed by paclitaxel weekly x 12 started 01/21/2015.  (a) paclitaxel stopped after 5 treatments due to persistent neuropathy   (4) radiation to follow chemotherapy.  (5) anti-estrogens to follow radiation  (6) history of celiac disease  PLAN: Joliene and I discussed her neuropathy symptoms, and decided it was for the best to discontinue the paclitaxel. She has finished the bulk of her prescribed treatments, and last week Dr. Jana Hakim was comfortable with stopping at this point.   I have placed a referral for a consultation with Dr. Pablo Ledger to discuss radiation therapy.   Shamila will avoid salt and elevate her legs as much as possible. I believe her weight gain is tied to her harboring fluids.   Keir will return in April for labs and a follow up visit with Dr. Jana Hakim. She understands and agrees with this plan. She knows the goal of treatment in her case is cure. She has been encouraged to call with any issues that might arise before her next  visit here.  Laurie Panda, NP   03/04/2015 2:00 PM

## 2015-03-04 NOTE — Telephone Encounter (Signed)
per pof to CX all appt up until 4/14-per referral to [facility] not available-will call Lysle Rubens 3/14 tosch pt rad on appt-pt aware

## 2015-03-05 ENCOUNTER — Ambulatory Visit: Payer: BLUE CROSS/BLUE SHIELD

## 2015-03-11 ENCOUNTER — Other Ambulatory Visit: Payer: BLUE CROSS/BLUE SHIELD

## 2015-03-11 ENCOUNTER — Ambulatory Visit: Payer: BLUE CROSS/BLUE SHIELD | Admitting: Nurse Practitioner

## 2015-03-11 ENCOUNTER — Ambulatory Visit: Payer: BLUE CROSS/BLUE SHIELD

## 2015-03-12 ENCOUNTER — Ambulatory Visit: Payer: BLUE CROSS/BLUE SHIELD

## 2015-03-18 ENCOUNTER — Other Ambulatory Visit: Payer: BLUE CROSS/BLUE SHIELD

## 2015-03-18 ENCOUNTER — Ambulatory Visit: Payer: BLUE CROSS/BLUE SHIELD | Admitting: Nurse Practitioner

## 2015-03-18 ENCOUNTER — Ambulatory Visit: Payer: BLUE CROSS/BLUE SHIELD

## 2015-03-19 ENCOUNTER — Ambulatory Visit: Payer: BLUE CROSS/BLUE SHIELD

## 2015-03-21 NOTE — Progress Notes (Signed)
Location of Breast Cancer:Right breast lower-outer invasive ductal carcinoma , grade 1  Histology per Pathology Report: 09/28/2014 Diagnosis 1. Lymph node, sentinel, biopsy, Right axillary #1 - BENIGN BREAST TISSUE, SEE COMMENT. - NEGATIVE FOR ATYPIA OR MALIGNANCY. - NEGATIVE FOR LYMPH NODE. 2. Breast, simple mastectomy, Right - MULTIFOCAL INVASIVE MAMMARY CARCINOMA WITH DUCTAL AND LOBULAR FEATURES, SEE COMMENT. - POSITIVE FOR LYMPH VASCULAR INVASION. - POSITIVE FOR PERINEURAL INVASION. - ONE OF THREE LYMPH NODES, POSITIVE FOR METASTATIC MAMMARY CARCINOMA (1/3). - INTRANODAL TUMOR DEPOSIT IS 1 MM - NEGATIVE FOR EXTRACAPSULAR TUMOR EXTENSION. - INVASIVE TUMOR IS 0.7 CM FROM NEAREST MARGIN (DEEP). - PREVIOUS BIOPSY SITE IDENTIFIED. - SEE TUMOR SYNOPTIC TEMPLATE BELOW. 3. Lymph node, sentinel, biopsy, Right axillary #2 - ONE LYMPH NODE, NEGATIVE FOR TUMOR (0/1). SEE COMMENT. 4. Lymph node, sentinel, biopsy, Right axillary #3 - ONE LYMPH NODE, NEGATIVE FOR TUMOR (0/1), SEE COMMENT. 5. Lymph node, sentinel, biopsy, Right #4 - ONE LYMPH NODE, POSITIVE FOR METASTATIC MAMMARY CARCINOMA (1/1). - INTRANODAL TUMOR DEPOSIT IS 5 MM - POSITIVE FOR EXTRACAPSULAR TUMOR EXTENSION. Microscopic  Receptor Status: ER(+), PR (+), Her2-neu (2+ but negative by [facility])  Did patient present with symptoms (if so, please note symptoms) or was this found on screening mammography?:Patient noted pain in breast in late January 2015, by February noted that nipple was tilted sideways.In July 2015 noted a palpable  Mass. Diagnostic mammogram done  On 08/04/14 a [facility].Confirmed by ultrasound .  Past/Anticipated interventions by surgeon, if:09/28/14 Right total mastectomy, axillary sentinel node biopsy by Dr.Haywood Dalbert Batman.   Past/Anticipated interventions by medical oncology, if any: Chemotherapy:Completed 6 of 12 weekly doses of paclitaxel.  Lymphedema issues, if any:  Pain issues, if ZJQ:[id number] of  tips of toes and fingers.  SAFETY ISSUES:  Prior radiation?No   Pacemaker/ICD? No  Possible current pregnancy?lat menstrual cycle early December 2015.  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  No smoking or alcohol Allergies:gluten meal and penicillin    Arlyss Repress, RN 03/21/2015,10:13 AM

## 2015-03-23 ENCOUNTER — Encounter: Payer: Self-pay | Admitting: Radiation Oncology

## 2015-03-24 ENCOUNTER — Encounter: Payer: Self-pay | Admitting: Radiation Oncology

## 2015-03-24 ENCOUNTER — Ambulatory Visit
Admission: RE | Admit: 2015-03-24 | Discharge: 2015-03-24 | Disposition: A | Discharge status: Home / Self Care | Payer: BLUE CROSS/BLUE SHIELD | Source: Ambulatory Visit | Attending: Radiation Oncology | Admitting: Radiation Oncology

## 2015-03-24 ENCOUNTER — Telehealth: Payer: Self-pay | Admitting: *Deleted

## 2015-03-24 VITALS — BP 105/72 | HR 79 | Temp 97.9°F | Wt 131.6 lb

## 2015-03-24 DIAGNOSIS — Z170 Estrogen receptor positive status [ER+]: Secondary | ICD-10-CM | POA: Insufficient documentation

## 2015-03-24 DIAGNOSIS — L599 Disorder of the skin and subcutaneous tissue related to radiation, unspecified: Secondary | ICD-10-CM | POA: Insufficient documentation

## 2015-03-24 DIAGNOSIS — Z9221 Personal history of antineoplastic chemotherapy: Secondary | ICD-10-CM | POA: Insufficient documentation

## 2015-03-24 DIAGNOSIS — N6459 Other signs and symptoms in breast: Secondary | ICD-10-CM | POA: Insufficient documentation

## 2015-03-24 DIAGNOSIS — Z510 Encounter for antineoplastic radiation therapy: Secondary | ICD-10-CM | POA: Diagnosis not present

## 2015-03-24 DIAGNOSIS — C50511 Malignant neoplasm of lower-outer quadrant of right female breast: Secondary | ICD-10-CM

## 2015-03-24 DIAGNOSIS — Z9011 Acquired absence of right breast and nipple: Secondary | ICD-10-CM | POA: Diagnosis not present

## 2015-03-24 NOTE — Telephone Encounter (Signed)
Misty [name] TO HAVE THIS APPT. WITH DR. Lyndee Leo SANGER ON 04-05-15 - ARRIVAL TIME @ 8:30 AM @ THE [place] LOCATION, Misty [name] IS AWARE OF THIS APPT.

## 2015-03-24 NOTE — Progress Notes (Signed)
Radiation Oncology         [phone]) [phone] ________________________________  Initial outpatient Consultation - Date: 03/24/2015   Name: Misty [name] MRN: [id number]   DOB: Apr 23, 1976  REFERRING PHYSICIAN: Laurie Panda, NP  DIAGNOSIS:    ICD-9-CM ICD-10-CM   1. Breast cancer of lower-outer quadrant of right female breast 174.5 C50.511 Ambulatory referral to Plastic Surgery    STAGE: Breast cancer of lower-outer quadrant of right female breast   Staging form: Breast, AJCC 7th Edition     Clinical: Stage IIB (T2, N1, M0) - Signed by Chauncey Cruel, MD on 11/17/2014   HISTORY OF PRESENT ILLNESS::Misty [name] is a 39 y.o. female  Who was following up on some left breast abnormalities when an area of calcifications in the right breast was noted to be enlarged. She noted right breast pain, a palpable mass and nipple eversion and presented for a mammogram. This was performed in 07/2014 and showed an area of distortion as well as a 2.4 cm mass in the right breast as well as a 0.8 cm mass near the nipple. A biopsy on 8/19 of both masses showed a grade 1 IDC which was ER and PR positive, HER2 negative. Bilateral breast MRI showed the 2 masses in the right breast as well as addition masses. No abnormalities were seen in the left breast and no abnormal appearing lymph nodes. She had a mastectomy on 09/28/14 which showed Multifocal invasive mammary and lobular features with LVI and PNI. She had 1 sentinel and 1 non sentinel node positive for tumor with ECE in the sentinel node. Margins were negative. 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 She is accompanied by her husband. She met with Dr. Harlow Mares regarding reconstruction and would like to see another plastic surgeon to discuss this as well. She lives near [facility] but would like to have her radiation here. She is GxP 1 with her first birth at 68. She had her last period in December as she  started chemotherapy.   PREVIOUS RADIATION THERAPY: No  Past medical, social and family history were reviewed in the electronic chart. Review of symptoms was reviewed in the electronic chart. Medications were reviewed in the electronic chart.   PHYSICAL EXAM:  Filed Vitals:   03/24/15 0920  BP: 105/72  Pulse: 79  Temp: 97.9 F (36.6 C)  .131 lb 9.6 oz (59.693 kg). Pleasant female. No distress. Well healed scar along the right chest wall. Alert and oriented x 3.   IMPRESSION: T2N1 ID/ILC of the right breast s/p mastectomy  PLAN :I spoke to the patient today regarding her diagnosis and options for treatment. We discussed the equivalence in terms of survival and local failure between mastectomy and breast conservation. We discussed the role of radiation in decreasing local failures in patients who undergo mastectomy and have risk factors for recurrence including positive lymph nodes and/or tumors over 5 cm and/or positive margins. We discussed the process of simulation and the placement tattoos. We discussed 6 weeks of treatment as an outpatient. We discussed the possibility of asymptomatic lung damage. We discussed the low likelihood of secondary malignancies. We discussed the possible side effects including but not limited to skin redness, fatigue, permanent skin darkening, and chest wall swelling. We discussed increased complications that can occur with reconstruction after radiation.    I plugged her risk for additional nodes into the [facility] nomogram and she has at least a 40% chance of further  nodal involvement. I recommended treatment to the SCLV/axilla.   We spent a lot of time discussing reconstruction and that implant based reconstruction after radiation would not be recommended. I have referred her to Dr. Eda Paschal for discussions of options prior to beginning radiation. I don't think it is prudent to delay the start of her treatment to start the spacer/implant now.   I will  schedule her for simulation next week. She signed informed consent.   I spent 40 minutes  face to face with the patient and more than 50% of that time was spent in counseling and/or coordination of care.   ------------------------------------------------  Thea Silversmith, MD

## 2015-03-24 NOTE — Progress Notes (Signed)
Please see the Nurse Progress Note in the MD Initial Consult Encounter for this patient. 

## 2015-03-25 ENCOUNTER — Other Ambulatory Visit: Payer: BLUE CROSS/BLUE SHIELD

## 2015-03-25 ENCOUNTER — Ambulatory Visit: Payer: BLUE CROSS/BLUE SHIELD

## 2015-03-26 ENCOUNTER — Ambulatory Visit: Payer: BLUE CROSS/BLUE SHIELD

## 2015-04-01 ENCOUNTER — Ambulatory Visit: Payer: BLUE CROSS/BLUE SHIELD

## 2015-04-02 ENCOUNTER — Ambulatory Visit: Payer: BLUE CROSS/BLUE SHIELD

## 2015-04-07 ENCOUNTER — Ambulatory Visit (HOSPITAL_BASED_OUTPATIENT_CLINIC_OR_DEPARTMENT_OTHER): Payer: BLUE CROSS/BLUE SHIELD | Admitting: Oncology

## 2015-04-07 ENCOUNTER — Telehealth: Payer: Self-pay | Admitting: Oncology

## 2015-04-07 ENCOUNTER — Other Ambulatory Visit (HOSPITAL_BASED_OUTPATIENT_CLINIC_OR_DEPARTMENT_OTHER): Payer: BLUE CROSS/BLUE SHIELD

## 2015-04-07 ENCOUNTER — Ambulatory Visit
Admission: RE | Admit: 2015-04-07 | Discharge: 2015-04-07 | Disposition: A | Discharge status: Home / Self Care | Payer: BLUE CROSS/BLUE SHIELD | Source: Ambulatory Visit | Attending: Radiation Oncology | Admitting: Radiation Oncology

## 2015-04-07 VITALS — BP 110/68 | HR 71 | Temp 97.8°F | Resp 18 | Ht 61.0 in | Wt 130.0 lb

## 2015-04-07 DIAGNOSIS — E039 Hypothyroidism, unspecified: Secondary | ICD-10-CM

## 2015-04-07 DIAGNOSIS — K900 Celiac disease: Secondary | ICD-10-CM

## 2015-04-07 DIAGNOSIS — C50511 Malignant neoplasm of lower-outer quadrant of right female breast: Secondary | ICD-10-CM

## 2015-04-07 DIAGNOSIS — Z510 Encounter for antineoplastic radiation therapy: Secondary | ICD-10-CM | POA: Diagnosis not present

## 2015-04-07 DIAGNOSIS — G620 Drug-induced polyneuropathy: Secondary | ICD-10-CM | POA: Diagnosis not present

## 2015-04-07 DIAGNOSIS — T451X5A Adverse effect of antineoplastic and immunosuppressive drugs, initial encounter: Secondary | ICD-10-CM

## 2015-04-07 DIAGNOSIS — Z803 Family history of malignant neoplasm of breast: Secondary | ICD-10-CM

## 2015-04-07 LAB — CBC WITH DIFFERENTIAL/PLATELET
BASO%: 0.8 % (ref 0.0–2.0)
Basophils Absolute: 0 10*3/uL (ref 0.0–0.1)
EOS%: 5.8 % (ref 0.0–7.0)
Eosinophils Absolute: 0.3 10*3/uL (ref 0.0–0.5)
HCT: 39.3 % (ref 34.8–46.6)
HGB: 13 g/dL (ref 11.6–15.9)
LYMPH#: 0.9 10*3/uL (ref 0.9–3.3)
LYMPH%: 16.6 % (ref 14.0–49.7)
MCH: 30.2 pg (ref 25.1–34.0)
MCHC: 33 g/dL (ref 31.5–36.0)
MCV: 91.6 fL (ref 79.5–101.0)
MONO#: 0.5 10*3/uL (ref 0.1–0.9)
MONO%: 8.8 % (ref 0.0–14.0)
NEUT#: 3.5 10*3/uL (ref 1.5–6.5)
NEUT%: 68 % (ref 38.4–76.8)
PLATELETS: 176 10*3/uL (ref 145–400)
RBC: 4.29 10*6/uL (ref 3.70–5.45)
RDW: 13.7 % (ref 11.2–14.5)
WBC: 5.2 10*3/uL (ref 3.9–10.3)

## 2015-04-07 LAB — COMPREHENSIVE METABOLIC PANEL (CC13)
ALT: 26 U/L (ref 0–55)
ANION GAP: 8 meq/L (ref 3–11)
AST: 23 U/L (ref 5–34)
Albumin: 4 g/dL (ref 3.5–5.0)
Alkaline Phosphatase: 81 U/L (ref 40–150)
BUN: 15.6 mg/dL (ref 7.0–26.0)
CALCIUM: 9.7 mg/dL (ref 8.4–10.4)
CHLORIDE: 105 meq/L (ref 98–109)
CO2: 29 mEq/L (ref 22–29)
CREATININE: 0.7 mg/dL (ref 0.6–1.1)
GLUCOSE: 91 mg/dL (ref 70–140)
POTASSIUM: 4.6 meq/L (ref 3.5–5.1)
Sodium: 142 mEq/L (ref 136–145)
TOTAL PROTEIN: 6.6 g/dL (ref 6.4–8.3)
Total Bilirubin: 0.35 mg/dL (ref 0.20–1.20)

## 2015-04-07 NOTE — Progress Notes (Signed)
[place]  [phone] [phone]) [phone]     ID: Misty [name] DOB: 10-12-1976  MR#: [id number]  CWC#:[id number]  Patient Care Team: Glenda Chroman, MD as PCP - General (Internal Medicine) Fanny Skates, MD as Consulting Physician (General Surgery) Chauncey Cruel, MD as Consulting Physician (Oncology) PCP: Glenda Chroman., MD Santo Held, MD (GYN) Theodoro Kos M.D. (Plastics)  CHIEF COMPLAINT: Estrogen receptor positive breast cancer  CURRENT TREATMENT: adjuvant radiation therapy   BREAST CANCER HISTORY: From the original intake note:  Misty [name] was closely followed for some right breast changes between 20 11/12/2012. Everything seemed stable at that time. In 2014 she was promoted in her job, was working very hard, and actually missed having a mammogram. Late in January 2015 she noted some pain in her right breast and by February when she lifted her arms she noted that the nipple was tilting sideways and seemed a little bit of gathered. She didn't think much of this however. It was not until July that she noted a palpable mass in the lateral right breast. She brought this to her gynecologist's attention and she was set up for diagnostic mammography at [facility] 08/04/2014.   There was an area of distortion in the lateral aspect of the right breast noted on mammography,  corresponding to a palpable firm mass at the 8:00 position of the right breast. Ultrasound confirmed a 2.4 cm irregular hypoechoic spiculated mass in the right breast at the 8:00 position and in addition at the 5:00 position 1 cm from the nipple there was a taller than wide irregular hypoechoic mass measuring 0.8 cm. There was no right axillary adenopathy. In the left breast upper-outer quadrant there was a minimally complicated cyst measuring 0.7 cm.  On 08/11/2014 the patient underwent biopsy of the 2 right breast masses noted on ultrasound. Both showed invasive ductal carcinoma, grade  1, estrogen receptor 90% positive, progesterone receptor 90% positive, both with strong staining intensity, HER-2 equivocal at 2+ but negative by FISH, with the signals ratio being 1.20 and the number per nucleus 2.1. The proliferation fraction by MIB-1 was 17% ([id number]; this report was reviewed by our pathologist, who concurred, [name] [phone]).  On 08/25/2014 the patient underwent bilateral breast MRI at [place]. This showed, in the right breast, an irregular enhancing mass at the 8:00 position measuring 2.7 cm. Also in the right breast at the 6:00 position there was another enhancing mass measuring 0.6 cm, located 2 cm away from the larger mass. There were also masses in the retroareolar position measuring 0.7 cm, in the posterior central right breast measuring 0.5 cm, and another one just inferior to the midline measuring 5.5 cm. There were no masses or findings of concern in the left breast and no abnormal appearing lymph nodes.  The patient's subsequent history is as detailed below  INTERVAL HISTORY: Rox returns today for follow up of her breast cancer, accompanied by her husband. She has recovered nicely from her chemotherapy treatments and is ready to start radiation, with simulation planned for today.  REVIEW OF SYSTEMS: Misty [name] has very minimal residual peripheral neuropathy symptoms. These do not interfere with her daily activities. There are clearly grade 1. Her hair is coming back. It does have a little bit of salt-and-pepper and it. He hasn't yet begun to curl. She tells me her 30 year old son is doing well in school, with excellent grades and appropriate interest in her situation but not excessive concern, or at least that is the  way it seems to her. She has not had a period since her chemotherapy and is having hot flashes. There has been a 10 pound weight gain but that has stabilized. She does not have significant problems with insomnia. She does have some sinus symptoms and  she is going to be starting some loratadine for that. Otherwise a detailed review of systems today was noncontributory   PAST MEDICAL HISTORY: Past Medical History  Diagnosis Date  . Thyroid disease   . Celiac disease   . PONV (postoperative nausea and vomiting)   . Hypothyroidism   . Anxiety   . Headache(784.0)   . Cancer     breast cancer    PAST SURGICAL HISTORY: Past Surgical History  Procedure Laterality Date  . Cesarean section    . Gallbladder surgery    . Cholecystectomy    . Simple mastectomy with axillary sentinel node biopsy Right 09/28/2014    Procedure: RIGHT TOTAL MASTECTOMY, RIGHT  AXILLARY SENTINEL NODE BIOPSY;  Surgeon: Fanny Skates, MD;  Location: [place];  Service: General;  Laterality: Right;  . Portacath placement Right 10/22/2014    Procedure: INSERTION PORT-A-CATH;  Surgeon: Fanny Skates, MD;  Location: [place];  Service: General;  Laterality: Right;    FAMILY HISTORY Family History  Problem Relation Age of Onset  . Skin cancer Father   . Breast cancer Maternal Grandmother     dx unknown age  . Breast cancer Other 53    mat great aunt through [facility] with breast cancer   the patient's parents are both living, in their late 41s. The patient had one brother, no sisters. The patient's maternal grandmother was diagnosed with breast cancer in her late 83s. One of that grandmother's sisters was also diagnosed with breast cancer, in her 57s. There is no other history of breast or ovarian cancer in the family  GYNECOLOGIC HISTORY:  No LMP recorded. Menarche age 39, first live birth age 39, the patient is GX P1. 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 Her husband, Misty [name]  Junior, works as a Engineer, structural in [place]. Their son Misty [name] is 21. The patient attends a local [employer]: Not in place   HEALTH MAINTENANCE: History  Substance Use Topics  . Smoking status: Never Smoker   . Smokeless tobacco: Not on file  . Alcohol Use: No     Colonoscopy:  PAP: 07/29/2014  Bone density:  Lipid panel:  Allergies  Allergen Reactions  . Gluten Meal   . Penicillins Nausea And Vomiting    Current Outpatient Prescriptions  Medication Sig Dispense Refill  . levothyroxine (SYNTHROID) 100 MCG tablet Take 1 tablet (100 mcg total) by mouth daily before breakfast. 30 tablet 6   No current facility-administered medications for this visit.    OBJECTIVE: Middle-aged white woman in no acute distress Filed Vitals:   04/07/15 0854  BP: 110/68  Pulse: 71  Temp: 97.8 F (36.6 C)  Resp: 18     Body mass index is 24.58 kg/(m^2).    ECOG FS:1 - Symptomatic but completely ambulatory  Sclerae unicteric, pupils round and equal Oropharynx clear and moist-- no thrush or other lesions No cervical or supraclavicular adenopathy Lungs no rales or rhonchi  Heart regular rate and rhythm Abd soft, nontender, positive bowel sounds MSK no focal spinal tenderness, no upper extremity lymphedema Neuro: nonfocal, well oriented, appropriate affect Breasts: The right breast is status post mastectomy. There is no evidence of chest wall recurrence. The right axilla is benign. The left breast is unremarkable   LAB RESULTS:  CMP     Component Value Date/Time   NA 142 03/04/2015 1235   NA 142 09/23/2014 1456   K 4.4 03/04/2015 1235   K 3.9 09/23/2014 1456   CL 104 09/23/2014 1456   CO2 26 03/04/2015 1235   CO2 27 09/23/2014 1456   GLUCOSE 91 03/04/2015 1235   GLUCOSE 71 09/23/2014 1456   BUN 8.9 03/04/2015 1235   BUN 11 09/23/2014 1456   CREATININE 0.7 03/04/2015 1235   CREATININE 0.70 09/23/2014 1456   CALCIUM 9.6 03/04/2015 1235   CALCIUM 9.8  09/23/2014 1456   PROT 6.2* 03/04/2015 1235   PROT 7.7 09/23/2014 1456   ALBUMIN 3.7 03/04/2015 1235   ALBUMIN 4.0 09/23/2014 1456   AST 22 03/04/2015 1235   AST 19 09/23/2014 1456   ALT 22 03/04/2015 1235   ALT 12 09/23/2014 1456   ALKPHOS 87 03/04/2015 1235   ALKPHOS 60 09/23/2014 1456   BILITOT 0.35 03/04/2015 1235   BILITOT 0.5 09/23/2014 1456   GFRNONAA >90 09/23/2014 1456   GFRAA >90 09/23/2014 1456    I No results found for: SPEP  Lab Results  Component Value Date   WBC 5.2 04/07/2015   NEUTROABS 3.5 04/07/2015   HGB 13.0 04/07/2015   HCT 39.3 04/07/2015   MCV 91.6 04/07/2015   PLT 176 04/07/2015      Chemistry      Component Value Date/Time   NA 142 03/04/2015 1235   NA 142 09/23/2014 1456   K 4.4 03/04/2015 1235   K 3.9 09/23/2014 1456   CL 104 09/23/2014 1456   CO2 26 03/04/2015 1235   CO2 27 09/23/2014 1456   BUN 8.9 03/04/2015 1235   BUN 11 09/23/2014 1456   CREATININE 0.7 03/04/2015 1235   CREATININE 0.70 09/23/2014 1456      Component Value Date/Time   CALCIUM 9.6 03/04/2015 1235   CALCIUM 9.8 09/23/2014 1456   ALKPHOS 87 03/04/2015 1235   ALKPHOS 60 09/23/2014 1456   AST 22 03/04/2015 1235   AST 19 09/23/2014 1456   ALT 22 03/04/2015 1235   ALT 12 09/23/2014 1456   BILITOT 0.35 03/04/2015 1235   BILITOT 0.5 09/23/2014 1456       No results found for: LABCA2  No components found for: LABCA125  No results for input(s): INR in the last 168 hours.  Urinalysis No results found for: COLORURINE  STUDIES: No results found.  ASSESSMENT: 39 y.o. BRCA negative [place], [place] woman status post biopsy of 2 separate right breast masses 08/11/2014, both positive for an invasive ductal carcinoma, grade 1, estrogen and progesterone receptor strongly positive, with an MIB-1 of 19%, and HER-2 nonamplified  (1) genetics testing sent 08/26/2014, showing no mutations in the BRCA genes; there were also no mutations in ATM, BARD 1, BRIP1, CDH 1, CHE  K2, MR E11A, M UT YH, N BN, NF1, PA L B2, PTE N, RAD 50, RAD 50 1C, RAD 50 1D, and T p53.  (2) status post right mastectomy with sentinel lymph node sampling 09/28/2014 for an mpT2 pN1, stage IIB invasive breast cancer (with both ductal and lobular features), grade 2, with negative margins  (  3) adjuvant chemotherapy consisted of doxorubicin and cyclophosphamide in dose dense fashion 4, completed 12/30/2014, followed by paclitaxel weekly x 12 started 01/21/2015.  (a) paclitaxel stopped after 5 treatments due to persistent neuropathy, last dose 02/18/2015  (4) radiation to follow chemotherapy.  (5) anti-estrogens to follow radiation  (6) history of celiac disease  PLAN: Alayla has recovered nicely from her chemotherapy and is now ready to start radiation. She has not had her port flushed since late February. I have asked her to get that done late next week once she knows what time she will be receiving her radiation treatments. We can of course get the port removed once she completes her radiation  At this point she is not sure if she will want reconstruction. She did meet with Dr. Migdalia Dk and liked her quite a bit. She understands if she has reconstruction she will require a latissimus flap.  I'm making her a return appointment with me in about 2 months. By then she should be pretty much done with radiation and we will discuss starting with tamoxifen. If her periods resume we will have to consider goserelin.  She has a good understanding of the overall plan. She agrees with it. She knows a goal of treatment in her case is cure. She will call with any problems that may develop before her next visit here.  Chauncey Cruel, MD   04/07/2015 9:05 AM

## 2015-04-07 NOTE — Progress Notes (Addendum)
Name: Misty [name]   MRN: [id number]  Date:  04/07/2015  DOB: 08-12-1976  Status:outpatient    DIAGNOSIS: Breast cancer.  CONSENT VERIFIED: yes   SET UP: Patient is setup supine   IMMOBILIZATION:  The following immobilization was used:Custom Moldable Pillow, breast board.   NARRATIVE: [name] was brought to the [facility].  Identity was confirmed.  All relevant records and images related to the planned course of therapy were reviewed.  Then, the patient was positioned in a stable reproducible clinical set-up for radiation therapy.  Wires were placed to delineate the clinical extent of breast tissue. A wire was placed on the scar as well.  CT images were obtained.  An isocenter was placed. Skin markings were placed.  The CT images were loaded into the planning software where the target and avoidance structures were contoured.  The radiation prescription was entered and confirmed. The patient was discharged in stable condition and tolerated simulation well.    TREATMENT PLANNING NOTE:  Treatment planning then occurred. I have requested : MLC's, isodose plan, basic dose calculation  I personally designed and supervised the construction of 5 medically necessary complex treatment devices for the protection of critical normal structures including the lungs and contralateral breast as well as the immobilization device which is necessary for set up certainty.   TREATMENT PLANNING NOTE/3D Simulation Note Treatment planning then occurred. I have requested : MLC's, isodose plan, basic dose calculation  3D simulation was performed.  I personally constructed 7 complex treatment devices in the form of MLCs which will be used for beam modification and to protect critical structures including the heart and lung.  I have requested a dose volume histogram of the heart lung and tumor cavity.

## 2015-04-07 NOTE — Telephone Encounter (Signed)
Confirmed appointment for April/June. Will get calendar through [facility].

## 2015-04-12 ENCOUNTER — Ambulatory Visit (HOSPITAL_BASED_OUTPATIENT_CLINIC_OR_DEPARTMENT_OTHER): Payer: BLUE CROSS/BLUE SHIELD | Admitting: Nurse Practitioner

## 2015-04-12 ENCOUNTER — Ambulatory Visit (HOSPITAL_BASED_OUTPATIENT_CLINIC_OR_DEPARTMENT_OTHER): Payer: BLUE CROSS/BLUE SHIELD

## 2015-04-12 ENCOUNTER — Ambulatory Visit (HOSPITAL_COMMUNITY)
Admission: RE | Admit: 2015-04-12 | Discharge: 2015-04-12 | Disposition: A | Discharge status: Home / Self Care | Payer: BLUE CROSS/BLUE SHIELD | Source: Ambulatory Visit | Attending: Nurse Practitioner | Admitting: Nurse Practitioner

## 2015-04-12 ENCOUNTER — Encounter: Payer: Self-pay | Admitting: Nurse Practitioner

## 2015-04-12 ENCOUNTER — Telehealth: Payer: Self-pay | Admitting: *Deleted

## 2015-04-12 VITALS — BP 114/52 | HR 74 | Temp 98.5°F

## 2015-04-12 DIAGNOSIS — Z95828 Presence of other vascular implants and grafts: Secondary | ICD-10-CM

## 2015-04-12 DIAGNOSIS — C50511 Malignant neoplasm of lower-outer quadrant of right female breast: Secondary | ICD-10-CM

## 2015-04-12 DIAGNOSIS — R609 Edema, unspecified: Secondary | ICD-10-CM | POA: Diagnosis present

## 2015-04-12 DIAGNOSIS — Z853 Personal history of malignant neoplasm of breast: Secondary | ICD-10-CM

## 2015-04-12 DIAGNOSIS — R600 Localized edema: Secondary | ICD-10-CM

## 2015-04-12 MED ORDER — HEPARIN SOD (PORK) LOCK FLUSH 100 UNIT/ML IV SOLN
500.0000 [IU] | Freq: Once | INTRAVENOUS | Status: AC
  Administered 2015-04-12: 500 [IU] via INTRAVENOUS
  Filled 2015-04-12: qty 5

## 2015-04-12 MED ORDER — SODIUM CHLORIDE 0.9 % IJ SOLN
10.0000 mL | INTRAMUSCULAR | Status: DC | PRN
  Administered 2015-04-12: 10 mL via INTRAVENOUS
  Filled 2015-04-12: qty 10

## 2015-04-12 NOTE — Assessment & Plan Note (Addendum)
Patient complaining of new onset left upper extremity edema and pain since this past weekend.  She denies any known injury or trauma to her arm.  She denies any chest pain, chest pressure, shortness breath, or pain with inspiration.  On exam-patient with edema to entire left upper extremity; with increased edema from the left elbow up.  Also, left upper arm slightly tender with palpation.  Patient still with full range of motion.  Doppler ultrasound obtained today was negative for either DVT or superficial thrombosis.  Patient was advised to elevate her arm above the level of for heart when possible.  Also, reviewed patient's most recent blood counts-and advised that patient could take some ibuprofen on an intermittent basis as well.  Advised patient to call/return to go directly to the emergency department for any persistent or worsening symptoms whatsoever.

## 2015-04-12 NOTE — Patient Instructions (Addendum)
Please go to [place] for your appointment at [place].  We will contact you as soon as the results of your doppler are available.

## 2015-04-12 NOTE — Progress Notes (Signed)
*  PRELIMINARY RESULTS* Vascular Ultrasound Left upper extremity venous duplex has been completed.  Preliminary findings: No DVT or superficial thrombosis noted.  Called results to Drue Second, NP.  Landry Mellow, RDMS, RVT  04/12/2015, 11:22 AM

## 2015-04-12 NOTE — Patient Instructions (Signed)

## 2015-04-12 NOTE — Assessment & Plan Note (Signed)
Patient is status post right mastectomy in October 2015; and completed chemotherapy on 01/21/2015.  She is scheduled to initiate radiation treatments this coming Monday, 04/18/2015.  She is scheduled for labs and a follow-up visit with Dr. Jana Hakim on 06/02/2015.

## 2015-04-12 NOTE — Telephone Encounter (Signed)
Spoke to pt in room. Pain started at inner elbow last week. Today the pain has progressively gotten worse. Pt states the swelling started around her elbow over the weekend. The swelling has spread to her forearm and upper arm. The pain is now radiating to her left scapula.

## 2015-04-12 NOTE — Telephone Encounter (Signed)
Received call from flush nurse- pt is complaining of left arm pain, swelling and redness. Port last flushed 8 weeks ago. Pt is waiting to be seen by [place] to evaluate need for doppler. Pt completed chemo on Dec 30, 2014.

## 2015-04-12 NOTE — Progress Notes (Signed)
Pt in for a PAC flush.  Pt complains that left arm is swollen and tender.  States her left arm has been swollen for about a week.  No swelling noted to right arm.  PAC to right side accessed without difficulty and blood return noted.  Dr Jana Hakim informed of pt concerns.  Clarise Cruz, LPN (Selena Lesser, NP nurse for today) informed of pt concerns.  Pt instructed to wait in lobby until she can be assessed by Selena Lesser, NP. Pt verbalizes understanding.

## 2015-04-12 NOTE — Progress Notes (Signed)
SYMPTOM MANAGEMENT CLINIC   HPI: Misty [name] 39 y.o. female diagnosed with breast cancer.  Patient is status post right mastectomy; and completed chemotherapy on 01/21/2015.  Patient is scheduled to initiate radiation treatments this coming Monday, 04/18/2015.  Patient complaining of new onset left upper extremity edema and pain since this past weekend.  She denies any known injury or trauma to her arm.  She denies any chest pain, chest pressure, shortness breath, or pain with inspiration.  She denies any recent fevers or chills.  HPI  ROS  Past Medical History  Diagnosis Date  . Thyroid disease   . Celiac disease   . PONV (postoperative nausea and vomiting)   . Hypothyroidism   . Anxiety   . Headache(784.0)   . Cancer     breast cancer    Past Surgical History  Procedure Laterality Date  . Cesarean section    . Gallbladder surgery    . Cholecystectomy    . Simple mastectomy with axillary sentinel node biopsy Right 09/28/2014    Procedure: RIGHT TOTAL MASTECTOMY, RIGHT  AXILLARY SENTINEL NODE BIOPSY;  Surgeon: Fanny Skates, MD;  Location: [place];  Service: General;  Laterality: Right;  . Portacath placement Right 10/22/2014    Procedure: INSERTION PORT-A-CATH;  Surgeon: Fanny Skates, MD;  Location: [place];  Service: General;  Laterality: Right;    has Breast cancer of lower-outer quadrant of right female breast; Family history of malignant neoplasm of breast; Celiac disease; Dehydration; Nausea; Anorexia; Weight loss; Constipation; Fatigue; Hypotension; Urinary frequency; Chemotherapy-induced neuropathy; and Edema of upper extremity on her problem list.    is allergic to gluten meal and penicillins.    Medication List       This list is accurate as of: 04/12/15 11:58 AM.  Always use your most recent med list.               levothyroxine 100 MCG tablet  Commonly known as:  SYNTHROID  Take 1 tablet (100 mcg total) by mouth  daily before breakfast.         PHYSICAL EXAMINATION  Oncology Vitals 04/12/2015 04/07/2015 03/24/2015 03/04/2015 02/25/2015 02/18/2015 02/11/2015  Height - 155 cm - 155 cm 155 cm 155 cm 155 cm  Weight - 58.968 kg 59.693 kg 60.328 kg 59.784 kg 59.648 kg 59.512 kg  Weight (lbs) - 130 lbs 131 lbs 10 oz 133 lbs 131 lbs 13 oz 131 lbs 8 oz 131 lbs 3 oz  BMI (kg/m2) - 24.56 kg/m2 - 25.13 kg/m2 24.9 kg/m2 24.85 kg/m2 24.79 kg/m2  Temp 98.5 97.8 97.9 98.2 98 98.5 98.1  Pulse 74 71 79 80 83 76 84  Resp - 18 - 18 18 18 18   SpO2 100 - - 100 - - -  BSA (m2) - 1.59 m2 - 1.61 m2 1.6 m2 1.6 m2 1.6 m2   BP Readings from Last 3 Encounters:  04/12/15 114/52  04/07/15 110/68  03/04/15 113/57    Physical Exam  Constitutional: She is oriented to person, place, and time and well-developed, well-nourished, and in no distress.  HENT:  Head: Normocephalic and atraumatic.  Mouth/Throat: Oropharynx is clear and moist.  Eyes: Conjunctivae and EOM are normal. Pupils are equal, round, and reactive to light. Right eye exhibits no discharge. Left eye exhibits no discharge. Scleral icterus is present.  Neck: Normal range of motion.  Pulmonary/Chest: No respiratory distress.  Musculoskeletal: Normal range of motion. She exhibits edema and tenderness.  Patient with  mild edema to his entire left upper extremity; with increased edema from the left elbow up.  Also, patient is noted to have some tenderness to left upper extremity from the elbow up to her shoulder as well.  Patient does work with full range of motion.  There is no erythema, warmth, or red streaks to arm.  Neurological: She is alert and oriented to person, place, and time. Gait normal.  Skin: Skin is warm and dry. No rash noted. No erythema. No pallor.  Psychiatric: Affect normal.  Nursing note and vitals reviewed.   LABORATORY DATA:. No visits with results within 3 Day(s) from this visit. Latest known visit with results is:  Appointment on 04/07/2015    Component Date Value Ref Range Status  . WBC 04/07/2015 5.2  3.9 - 10.3 10e3/uL Final  . NEUT# 04/07/2015 3.5  1.5 - 6.5 10e3/uL Final  . HGB 04/07/2015 13.0  11.6 - 15.9 g/dL Final  . HCT 04/07/2015 39.3  34.8 - 46.6 % Final  . Platelets 04/07/2015 176  145 - 400 10e3/uL Final  . MCV 04/07/2015 91.6  79.5 - 101.0 fL Final  . MCH 04/07/2015 30.2  25.1 - 34.0 pg Final  . MCHC 04/07/2015 33.0  31.5 - 36.0 g/dL Final  . RBC 04/07/2015 4.29  3.70 - 5.45 10e6/uL Final  . RDW 04/07/2015 13.7  11.2 - 14.5 % Final  . lymph# 04/07/2015 0.9  0.9 - 3.3 10e3/uL Final  . MONO# 04/07/2015 0.5  0.1 - 0.9 10e3/uL Final  . Eosinophils Absolute 04/07/2015 0.3  0.0 - 0.5 10e3/uL Final  . Basophils Absolute 04/07/2015 0.0  0.0 - 0.1 10e3/uL Final  . NEUT% 04/07/2015 68.0  38.4 - 76.8 % Final  . LYMPH% 04/07/2015 16.6  14.0 - 49.7 % Final  . MONO% 04/07/2015 8.8  0.0 - 14.0 % Final  . EOS% 04/07/2015 5.8  0.0 - 7.0 % Final  . BASO% 04/07/2015 0.8  0.0 - 2.0 % Final  . Sodium 04/07/2015 142  136 - 145 mEq/L Final  . Potassium 04/07/2015 4.6  3.5 - 5.1 mEq/L Final  . Chloride 04/07/2015 105  98 - 109 mEq/L Final  . CO2 04/07/2015 29  22 - 29 mEq/L Final  . Glucose 04/07/2015 91  70 - 140 mg/dl Final  . BUN 04/07/2015 15.6  7.0 - 26.0 mg/dL Final  . Creatinine 04/07/2015 0.7  0.6 - 1.1 mg/dL Final  . Total Bilirubin 04/07/2015 0.35  0.20 - 1.20 mg/dL Final  . Alkaline Phosphatase 04/07/2015 81  40 - 150 U/L Final  . AST 04/07/2015 23  5 - 34 U/L Final  . ALT 04/07/2015 26  0 - 55 U/L Final  . Total Protein 04/07/2015 6.6  6.4 - 8.3 g/dL Final  . Albumin 04/07/2015 4.0  3.5 - 5.0 g/dL Final  . Calcium 04/07/2015 9.7  8.4 - 10.4 mg/dL Final  . Anion Gap 04/07/2015 8  3 - 11 mEq/L Final  . EGFR 04/07/2015 >90  >90 ml/min/1.73 m2 Final   eGFR is calculated using the CKD-EPI Creatinine Equation (2009)   Doppler US:   RADIOGRAPHIC STUDIES: No results found.  ASSESSMENT/PLAN:    Breast cancer of  lower-outer quadrant of right female breast Patient is status post right mastectomy in October 2015; and completed chemotherapy on 01/21/2015.  She is scheduled to initiate radiation treatments this coming Monday, 04/18/2015.  She is scheduled for labs and a follow-up visit with Dr. Jana Hakim on 06/02/2015.   Edema  of upper extremity Patient complaining of new onset left upper extremity edema and pain since this past weekend.  She denies any known injury or trauma to her arm.  She denies any chest pain, chest pressure, shortness breath, or pain with inspiration.  On exam-patient with edema to entire left upper extremity; with increased edema from the left elbow up.  Also, left upper arm slightly tender with palpation.  Patient still with full range of motion.  Doppler ultrasound obtained today was negative for either DVT or superficial thrombosis.  Patient was advised to elevate her arm above the level of for heart when possible.  Also, reviewed patient's most recent blood counts-and advised that patient could take some ibuprofen on an intermittent basis as well.  Advised patient to call/return to go directly to the emergency department for any persistent or worsening symptoms whatsoever.    Patient stated understanding of all instructions; and was in agreement with this plan of care. The patient knows to call the clinic with any problems, questions or concerns.   Review/collaboration with Dr. Jana Hakim regarding all aspects of patient's visit today.   Total time spent with patient was 40 minutes;  with greater than 75 percent of that time spent in face to face counseling regarding patient's symptoms,  and coordination of care and follow up.  Disclaimer: This note was dictated with voice recognition software. Similar sounding words can inadvertently be transcribed and may not be corrected upon review.   Drue Second, NP 04/12/2015

## 2015-04-13 DIAGNOSIS — Z510 Encounter for antineoplastic radiation therapy: Secondary | ICD-10-CM | POA: Diagnosis not present

## 2015-04-14 ENCOUNTER — Telehealth: Payer: Self-pay | Admitting: *Deleted

## 2015-04-14 ENCOUNTER — Ambulatory Visit
Admission: RE | Admit: 2015-04-14 | Discharge: 2015-04-14 | Disposition: A | Discharge status: Home / Self Care | Payer: BLUE CROSS/BLUE SHIELD | Source: Ambulatory Visit | Attending: Radiation Oncology | Admitting: Radiation Oncology

## 2015-04-14 DIAGNOSIS — Z510 Encounter for antineoplastic radiation therapy: Secondary | ICD-10-CM | POA: Diagnosis not present

## 2015-04-14 NOTE — Telephone Encounter (Signed)
Lm for pt to rtn call- check status how her arm is doing. Find out if the pain and swelling have decreased.  Doppler was negative for DVT.

## 2015-04-15 ENCOUNTER — Encounter: Payer: Self-pay | Admitting: Radiation Oncology

## 2015-04-15 NOTE — Progress Notes (Signed)
  Radiation Oncology         [phone] ________________________________  Name: Misty [name] MRN: [id number]  Date: 04/14/15  DOB: [birth year]  Simulation Verification Note   Status: outpatient  NARRATIVE: The patient was brought to the treatment unit and placed in the planned treatment position. The clinical setup was verified. Then port films were obtained and uploaded to the radiation oncology medical record software.  The treatment beams were carefully compared against the planned radiation fields. The position location and shape of the radiation fields was reviewed. They targeted volume of tissue appears to be appropriately covered by the radiation beams. Organs at risk appear to be excluded as planned.  Based on my personal review, I approved the simulation verification. The patient's treatment will proceed as planned.  -----------------------------------  Blair Promise, PhD, MD

## 2015-04-18 ENCOUNTER — Ambulatory Visit
Admission: RE | Admit: 2015-04-18 | Discharge: 2015-04-18 | Disposition: A | Discharge status: Home / Self Care | Payer: BLUE CROSS/BLUE SHIELD | Source: Ambulatory Visit | Attending: Radiation Oncology | Admitting: Radiation Oncology

## 2015-04-18 DIAGNOSIS — Z510 Encounter for antineoplastic radiation therapy: Secondary | ICD-10-CM | POA: Diagnosis not present

## 2015-04-19 ENCOUNTER — Ambulatory Visit
Admission: RE | Admit: 2015-04-19 | Discharge: 2015-04-19 | Disposition: A | Discharge status: Home / Self Care | Payer: BLUE CROSS/BLUE SHIELD | Source: Ambulatory Visit | Attending: Radiation Oncology | Admitting: Radiation Oncology

## 2015-04-19 VITALS — BP 109/44 | HR 67 | Resp 16 | Wt 130.1 lb

## 2015-04-19 DIAGNOSIS — Z510 Encounter for antineoplastic radiation therapy: Secondary | ICD-10-CM | POA: Diagnosis not present

## 2015-04-19 DIAGNOSIS — C50511 Malignant neoplasm of lower-outer quadrant of right female breast: Secondary | ICD-10-CM

## 2015-04-19 MED ORDER — RADIAPLEXRX EX GEL: Freq: Once | CUTANEOUS | Status: DC

## 2015-04-19 MED ORDER — ALRA NON-METALLIC DEODORANT (RAD-ONC): 1.0000 "application " | Freq: Once | TOPICAL | Status: DC

## 2015-04-19 NOTE — Progress Notes (Signed)
Weekly Management Note Current Dose:  3.6 Gy  Projected Dose:60.4  Gy   Narrative:  The patient presents for routine under treatment assessment.  CBCT/MVCT images/Port film x-rays were reviewed.  The chart was checked. Doing well. No complaints.   Physical Findings: Weight: 130 lb 1.6 oz (59.013 kg). Unchanged  Impression:  The patient is tolerating radiation.  Plan:  Continue treatment as planned. RN education performed.

## 2015-04-19 NOTE — Progress Notes (Addendum)
BP low. Encouraged patient to force fluids tonight and allow a nurse to check BP again tomorrow following radiation treatment. Patient verbalized understanding. Patient without complaints following second treatment. No skin changes or fatigue reported. Post sim education complete.  Oriented patient to staff and routine of the clinic. Provided patient with RADIATION THERAPY AND YOU handbook then, reviewed pertinent information. Provided patient with radiaplex and alra then, directed upon use. Educated patient reference potential side effects and management such as fatigue and skin changes. Answered all patient questions to the best of my ability. Patient verbalized understanding of all reviewed.

## 2015-04-19 NOTE — Addendum Note (Signed)
Encounter addended by: Heywood Footman, RN on: 04/19/2015  5:59 PM<BR>     Documentation filed: Inpatient Patient Education, Notes Section, Medications, Chief Complaint Section

## 2015-04-20 ENCOUNTER — Ambulatory Visit
Admission: RE | Admit: 2015-04-20 | Discharge: 2015-04-20 | Disposition: A | Discharge status: Home / Self Care | Payer: BLUE CROSS/BLUE SHIELD | Source: Ambulatory Visit | Attending: Radiation Oncology | Admitting: Radiation Oncology

## 2015-04-20 DIAGNOSIS — Z510 Encounter for antineoplastic radiation therapy: Secondary | ICD-10-CM | POA: Diagnosis not present

## 2015-04-21 ENCOUNTER — Ambulatory Visit
Admission: RE | Admit: 2015-04-21 | Discharge: 2015-04-21 | Disposition: A | Discharge status: Home / Self Care | Payer: BLUE CROSS/BLUE SHIELD | Source: Ambulatory Visit | Attending: Radiation Oncology | Admitting: Radiation Oncology

## 2015-04-21 DIAGNOSIS — Z510 Encounter for antineoplastic radiation therapy: Secondary | ICD-10-CM | POA: Diagnosis not present

## 2015-04-22 ENCOUNTER — Ambulatory Visit
Admission: RE | Admit: 2015-04-22 | Discharge: 2015-04-22 | Disposition: A | Discharge status: Home / Self Care | Payer: BLUE CROSS/BLUE SHIELD | Source: Ambulatory Visit | Attending: Radiation Oncology | Admitting: Radiation Oncology

## 2015-04-22 DIAGNOSIS — Z510 Encounter for antineoplastic radiation therapy: Secondary | ICD-10-CM | POA: Diagnosis not present

## 2015-04-25 ENCOUNTER — Ambulatory Visit
Admission: RE | Admit: 2015-04-25 | Discharge: 2015-04-25 | Disposition: A | Discharge status: Home / Self Care | Payer: BLUE CROSS/BLUE SHIELD | Source: Ambulatory Visit | Attending: Radiation Oncology | Admitting: Radiation Oncology

## 2015-04-25 DIAGNOSIS — Z510 Encounter for antineoplastic radiation therapy: Secondary | ICD-10-CM | POA: Diagnosis not present

## 2015-04-26 ENCOUNTER — Ambulatory Visit
Admission: RE | Admit: 2015-04-26 | Discharge: 2015-04-26 | Disposition: A | Discharge status: Home / Self Care | Payer: BLUE CROSS/BLUE SHIELD | Source: Ambulatory Visit | Attending: Radiation Oncology | Admitting: Radiation Oncology

## 2015-04-26 ENCOUNTER — Other Ambulatory Visit: Payer: Self-pay | Admitting: Radiation Oncology

## 2015-04-26 ENCOUNTER — Encounter: Payer: Self-pay | Admitting: Radiation Oncology

## 2015-04-26 VITALS — BP 112/58 | HR 97 | Temp 98.0°F | Resp 16 | Ht 61.0 in | Wt 130.6 lb

## 2015-04-26 DIAGNOSIS — C50511 Malignant neoplasm of lower-outer quadrant of right female breast: Secondary | ICD-10-CM

## 2015-04-26 DIAGNOSIS — Z510 Encounter for antineoplastic radiation therapy: Secondary | ICD-10-CM | POA: Diagnosis not present

## 2015-04-26 DIAGNOSIS — N6459 Other signs and symptoms in breast: Secondary | ICD-10-CM

## 2015-04-26 NOTE — Progress Notes (Signed)
Weekly Management Note Current Dose:  12.6 Gy  Projected Dose: 60.4 Gy   Narrative:  The patient presents for routine under treatment assessment.  CBCT/MVCT images/Port film x-rays were reviewed.  The chart was checked. Noticed nipple eversion on the left with no palpable masses over the past few months. Concerned this is due to cancer as this is what happened before.  No pain. Using radiaplex.   Physical Findings: Weight: 130 lb 9.6 oz (59.24 kg). Tight left breast. No obvious palpable masses. No palpable axillary adenopathy bilaterally. No visual signs of tumor recurrence.   Impression:  The patient is tolerating radiation.  Plan:  Continue treatment as planned. Left mammo and ultrasound.

## 2015-04-26 NOTE — Progress Notes (Signed)
Misty [name] has completed 7 fractions to her right chestwall and subclavian area.  She denies pain.  She reports fatigue.  She has slight hyperpigmentation on her right chest wall.  She is using radiaplex.  BP 112/58 mmHg  Pulse 97  Temp(Src) 98 F (36.7 C) (Oral)  Resp 16  Ht 5\' 1"  (1.549 m)  Wt 130 lb 9.6 oz (59.24 kg)  BMI 24.69 kg/m2

## 2015-04-27 ENCOUNTER — Ambulatory Visit
Admission: RE | Admit: 2015-04-27 | Discharge: 2015-04-27 | Disposition: A | Discharge status: Home / Self Care | Payer: BLUE CROSS/BLUE SHIELD | Source: Ambulatory Visit | Attending: Radiation Oncology | Admitting: Radiation Oncology

## 2015-04-27 DIAGNOSIS — Z510 Encounter for antineoplastic radiation therapy: Secondary | ICD-10-CM | POA: Diagnosis not present

## 2015-04-28 ENCOUNTER — Ambulatory Visit
Admission: RE | Admit: 2015-04-28 | Discharge: 2015-04-28 | Disposition: A | Discharge status: Home / Self Care | Payer: BLUE CROSS/BLUE SHIELD | Source: Ambulatory Visit | Attending: Radiation Oncology | Admitting: Radiation Oncology

## 2015-04-28 DIAGNOSIS — Z510 Encounter for antineoplastic radiation therapy: Secondary | ICD-10-CM | POA: Diagnosis not present

## 2015-04-29 ENCOUNTER — Ambulatory Visit
Admission: RE | Admit: 2015-04-29 | Discharge: 2015-04-29 | Disposition: A | Discharge status: Home / Self Care | Payer: BLUE CROSS/BLUE SHIELD | Source: Ambulatory Visit | Attending: Radiation Oncology | Admitting: Radiation Oncology

## 2015-04-29 DIAGNOSIS — Z510 Encounter for antineoplastic radiation therapy: Secondary | ICD-10-CM | POA: Diagnosis not present

## 2015-05-02 ENCOUNTER — Ambulatory Visit
Admission: RE | Admit: 2015-05-02 | Discharge: 2015-05-02 | Disposition: A | Discharge status: Home / Self Care | Payer: BLUE CROSS/BLUE SHIELD | Source: Ambulatory Visit | Attending: Radiation Oncology | Admitting: Radiation Oncology

## 2015-05-02 DIAGNOSIS — N6459 Other signs and symptoms in breast: Secondary | ICD-10-CM

## 2015-05-02 DIAGNOSIS — Z510 Encounter for antineoplastic radiation therapy: Secondary | ICD-10-CM | POA: Diagnosis not present

## 2015-05-03 ENCOUNTER — Ambulatory Visit
Admission: RE | Admit: 2015-05-03 | Discharge: 2015-05-03 | Disposition: A | Discharge status: Home / Self Care | Payer: BLUE CROSS/BLUE SHIELD | Source: Ambulatory Visit | Attending: Radiation Oncology | Admitting: Radiation Oncology

## 2015-05-03 VITALS — BP 102/67 | HR 73 | Temp 98.1°F | Wt 129.2 lb

## 2015-05-03 DIAGNOSIS — C50511 Malignant neoplasm of lower-outer quadrant of right female breast: Secondary | ICD-10-CM

## 2015-05-03 DIAGNOSIS — Z510 Encounter for antineoplastic radiation therapy: Secondary | ICD-10-CM | POA: Diagnosis not present

## 2015-05-03 NOTE — Progress Notes (Signed)
Weekly Management Note Current Dose: 21.6  Gy  Projected Dose: 61 Gy   Narrative:  The patient presents for routine under treatment assessment.  CBCT/MVCT images/Port film x-rays were reviewed.  The chart was checked. Doing well. Mammogram/ultrasound showed no abnormalities in the left breast.   Physical Findings: Weight: 129 lb 3.2 oz (58.605 kg). Unchanged chest wall.   Impression:  The patient is tolerating radiation.  Plan:  Continue treatment as planned.

## 2015-05-03 NOTE — Progress Notes (Signed)
Weekly assessment of radiation to right chest wall.completed 12 of 28 fractions.Mild redness.Denies pain.Continue application of radiaplex.

## 2015-05-04 ENCOUNTER — Ambulatory Visit
Admission: RE | Admit: 2015-05-04 | Discharge: 2015-05-04 | Disposition: A | Discharge status: Home / Self Care | Payer: BLUE CROSS/BLUE SHIELD | Source: Ambulatory Visit | Attending: Radiation Oncology | Admitting: Radiation Oncology

## 2015-05-04 DIAGNOSIS — Z510 Encounter for antineoplastic radiation therapy: Secondary | ICD-10-CM | POA: Diagnosis not present

## 2015-05-05 ENCOUNTER — Ambulatory Visit
Admission: RE | Admit: 2015-05-05 | Discharge: 2015-05-05 | Disposition: A | Discharge status: Home / Self Care | Payer: BLUE CROSS/BLUE SHIELD | Source: Ambulatory Visit | Attending: Radiation Oncology | Admitting: Radiation Oncology

## 2015-05-05 DIAGNOSIS — Z510 Encounter for antineoplastic radiation therapy: Secondary | ICD-10-CM | POA: Diagnosis not present

## 2015-05-06 ENCOUNTER — Ambulatory Visit
Admission: RE | Admit: 2015-05-06 | Discharge: 2015-05-06 | Disposition: A | Discharge status: Home / Self Care | Payer: BLUE CROSS/BLUE SHIELD | Source: Ambulatory Visit | Attending: Radiation Oncology | Admitting: Radiation Oncology

## 2015-05-06 DIAGNOSIS — Z510 Encounter for antineoplastic radiation therapy: Secondary | ICD-10-CM | POA: Diagnosis not present

## 2015-05-09 ENCOUNTER — Ambulatory Visit
Admission: RE | Admit: 2015-05-09 | Discharge: 2015-05-09 | Disposition: A | Discharge status: Home / Self Care | Payer: BLUE CROSS/BLUE SHIELD | Source: Ambulatory Visit | Attending: Radiation Oncology | Admitting: Radiation Oncology

## 2015-05-09 DIAGNOSIS — Z510 Encounter for antineoplastic radiation therapy: Secondary | ICD-10-CM | POA: Diagnosis not present

## 2015-05-10 ENCOUNTER — Ambulatory Visit
Admission: RE | Admit: 2015-05-10 | Discharge: 2015-05-10 | Disposition: A | Discharge status: Home / Self Care | Payer: BLUE CROSS/BLUE SHIELD | Source: Ambulatory Visit | Attending: Radiation Oncology | Admitting: Radiation Oncology

## 2015-05-10 VITALS — BP 97/44 | HR 77 | Temp 98.2°F | Wt 130.1 lb

## 2015-05-10 DIAGNOSIS — Z510 Encounter for antineoplastic radiation therapy: Secondary | ICD-10-CM | POA: Diagnosis not present

## 2015-05-10 DIAGNOSIS — C50511 Malignant neoplasm of lower-outer quadrant of right female breast: Secondary | ICD-10-CM

## 2015-05-10 NOTE — Progress Notes (Signed)
Weekly assessment of radiation to right chest wall.Day 17 of 28 treatments.Denies pain.Skin mild pink/red.No questions or concerns.

## 2015-05-10 NOTE — Progress Notes (Signed)
Weekly Management Note Current Dose: 30.6  Gy  Projected Dose:50.4 Gy   Narrative:  The patient presents for routine under treatment assessment.  CBCT/MVCT images/Port film x-rays were reviewed.  The chart was checked. Doing well. Questions about port films.   Physical Findings: Weight: 130 lb 1.6 oz (59.013 kg). Unchanged  Impression:  The patient is tolerating radiation.  Plan:  Continue treatment as planned. RN education performed.

## 2015-05-11 ENCOUNTER — Ambulatory Visit
Admission: RE | Admit: 2015-05-11 | Discharge: 2015-05-11 | Disposition: A | Discharge status: Home / Self Care | Payer: BLUE CROSS/BLUE SHIELD | Source: Ambulatory Visit | Attending: Radiation Oncology | Admitting: Radiation Oncology

## 2015-05-11 DIAGNOSIS — Z510 Encounter for antineoplastic radiation therapy: Secondary | ICD-10-CM | POA: Diagnosis not present

## 2015-05-12 ENCOUNTER — Ambulatory Visit
Admission: RE | Admit: 2015-05-12 | Discharge: 2015-05-12 | Disposition: A | Discharge status: Home / Self Care | Payer: BLUE CROSS/BLUE SHIELD | Source: Ambulatory Visit | Attending: Radiation Oncology | Admitting: Radiation Oncology

## 2015-05-12 DIAGNOSIS — Z510 Encounter for antineoplastic radiation therapy: Secondary | ICD-10-CM | POA: Diagnosis not present

## 2015-05-13 ENCOUNTER — Ambulatory Visit
Admission: RE | Admit: 2015-05-13 | Discharge: 2015-05-13 | Disposition: A | Discharge status: Home / Self Care | Payer: BLUE CROSS/BLUE SHIELD | Source: Ambulatory Visit | Attending: Radiation Oncology | Admitting: Radiation Oncology

## 2015-05-13 DIAGNOSIS — Z510 Encounter for antineoplastic radiation therapy: Secondary | ICD-10-CM | POA: Diagnosis not present

## 2015-05-16 ENCOUNTER — Ambulatory Visit
Admission: RE | Admit: 2015-05-16 | Discharge: 2015-05-16 | Disposition: A | Discharge status: Home / Self Care | Payer: BLUE CROSS/BLUE SHIELD | Source: Ambulatory Visit | Attending: Radiation Oncology | Admitting: Radiation Oncology

## 2015-05-16 DIAGNOSIS — Z510 Encounter for antineoplastic radiation therapy: Secondary | ICD-10-CM | POA: Diagnosis not present

## 2015-05-17 ENCOUNTER — Ambulatory Visit
Admission: RE | Admit: 2015-05-17 | Discharge: 2015-05-17 | Disposition: A | Discharge status: Home / Self Care | Payer: BLUE CROSS/BLUE SHIELD | Source: Ambulatory Visit | Attending: Radiation Oncology | Admitting: Radiation Oncology

## 2015-05-17 ENCOUNTER — Ambulatory Visit: Payer: BLUE CROSS/BLUE SHIELD | Admitting: Radiation Oncology

## 2015-05-17 VITALS — BP 103/48 | HR 72 | Temp 98.1°F | Wt 130.5 lb

## 2015-05-17 DIAGNOSIS — Z510 Encounter for antineoplastic radiation therapy: Secondary | ICD-10-CM | POA: Diagnosis not present

## 2015-05-17 DIAGNOSIS — C50511 Malignant neoplasm of lower-outer quadrant of right female breast: Secondary | ICD-10-CM

## 2015-05-17 NOTE — Progress Notes (Signed)
Weekly Management Note Current Dose:  39.6 Gy  Projected Dose: 61 Gy   Narrative:  The patient presents for routine under treatment assessment.  CBCT/MVCT images/Port film x-rays were reviewed.  The chart was checked. Doing well. No complaints. Left breast is stable.   Physical Findings: Weight: 130 lb 8 oz (59.194 kg). Unchanged chest wall.  Slightly pink.   Impression:  The patient is tolerating radiation.  Plan:  Continue treatment as planned. Continue radiaplex.

## 2015-05-18 ENCOUNTER — Ambulatory Visit
Admission: RE | Admit: 2015-05-18 | Discharge: 2015-05-18 | Disposition: A | Discharge status: Home / Self Care | Payer: BLUE CROSS/BLUE SHIELD | Source: Ambulatory Visit | Attending: Radiation Oncology | Admitting: Radiation Oncology

## 2015-05-18 DIAGNOSIS — Z510 Encounter for antineoplastic radiation therapy: Secondary | ICD-10-CM | POA: Diagnosis not present

## 2015-05-18 NOTE — Progress Notes (Signed)
Called back to [facility] for b/p check, patient stated she felt a little dizzy ness coming into cancer center and also started getting a head ache, took vitals, patient can take tylenol or ibuprofen , ambulated steady gait, encouraged patient to drink fluids, not work today if dizzy ness and h/a gets worse if not better after taking medication, eating, call back,patient agreed 8:39 AM  BP 126/70 mmHg  Pulse 85  Temp(Src) 98.2 F (36.8 C) (Oral)  Resp 16  SpO2 100%

## 2015-05-19 ENCOUNTER — Ambulatory Visit
Admission: RE | Admit: 2015-05-19 | Discharge: 2015-05-19 | Disposition: A | Discharge status: Home / Self Care | Payer: BLUE CROSS/BLUE SHIELD | Source: Ambulatory Visit | Attending: Radiation Oncology | Admitting: Radiation Oncology

## 2015-05-19 DIAGNOSIS — Z510 Encounter for antineoplastic radiation therapy: Secondary | ICD-10-CM | POA: Diagnosis not present

## 2015-05-20 ENCOUNTER — Ambulatory Visit
Admission: RE | Admit: 2015-05-20 | Discharge: 2015-05-20 | Disposition: A | Discharge status: Home / Self Care | Payer: BLUE CROSS/BLUE SHIELD | Source: Ambulatory Visit | Attending: Radiation Oncology | Admitting: Radiation Oncology

## 2015-05-20 DIAGNOSIS — Z510 Encounter for antineoplastic radiation therapy: Secondary | ICD-10-CM | POA: Diagnosis not present

## 2015-05-21 NOTE — Addendum Note (Signed)
Encounter addended by: Benn Moulder, RN on: 05/21/2015 10:28 AM<BR>     Documentation filed: Inpatient Patient Education

## 2015-05-24 ENCOUNTER — Ambulatory Visit
Admission: RE | Admit: 2015-05-24 | Discharge: 2015-05-24 | Disposition: A | Discharge status: Home / Self Care | Payer: BLUE CROSS/BLUE SHIELD | Source: Ambulatory Visit | Attending: Radiation Oncology | Admitting: Radiation Oncology

## 2015-05-24 DIAGNOSIS — C50511 Malignant neoplasm of lower-outer quadrant of right female breast: Secondary | ICD-10-CM

## 2015-05-24 DIAGNOSIS — Z510 Encounter for antineoplastic radiation therapy: Secondary | ICD-10-CM | POA: Diagnosis not present

## 2015-05-24 NOTE — Progress Notes (Signed)
Weekly Management Note Current Dose: 46.8  Gy  Projected Dose: 50.4 Gy   Narrative:  The patient presents for routine under treatment assessment.  CBCT/MVCT images/Port film x-rays were reviewed.  The chart was checked. Doing well. No complaints. Ready to be done.   Physical Findings: Weight:  . Unchanged. Chest wall skin is pink.   Impression:  The patient is tolerating radiation.  Plan:  Continue treatment as planned. Continue radiaplex. Discussed post rt skin care.

## 2015-05-25 ENCOUNTER — Ambulatory Visit
Admission: RE | Admit: 2015-05-25 | Discharge: 2015-05-25 | Disposition: A | Discharge status: Home / Self Care | Payer: BLUE CROSS/BLUE SHIELD | Source: Ambulatory Visit | Attending: Radiation Oncology | Admitting: Radiation Oncology

## 2015-05-25 DIAGNOSIS — Z510 Encounter for antineoplastic radiation therapy: Secondary | ICD-10-CM | POA: Diagnosis not present

## 2015-05-26 ENCOUNTER — Encounter: Payer: Self-pay | Admitting: Radiation Oncology

## 2015-05-26 ENCOUNTER — Ambulatory Visit: Payer: BLUE CROSS/BLUE SHIELD | Admitting: Radiation Oncology

## 2015-05-26 ENCOUNTER — Ambulatory Visit
Admission: RE | Admit: 2015-05-26 | Discharge: 2015-05-26 | Disposition: A | Discharge status: Home / Self Care | Payer: BLUE CROSS/BLUE SHIELD | Source: Ambulatory Visit | Attending: Radiation Oncology | Admitting: Radiation Oncology

## 2015-05-26 DIAGNOSIS — Z510 Encounter for antineoplastic radiation therapy: Secondary | ICD-10-CM | POA: Diagnosis not present

## 2015-05-27 ENCOUNTER — Ambulatory Visit: Payer: BLUE CROSS/BLUE SHIELD

## 2015-05-29 NOTE — Progress Notes (Signed)
  Radiation Oncology         [phone] ________________________________  Name: Misty [name] MRN: [id number]  Date: 05/26/2015  DOB: 31-Oct-1976  End of Treatment Note  Diagnosis:  Breast cancer of lower-outer quadrant of right female breast   Staging form: Breast, AJCC 7th Edition     Clinical: Stage IIB (T2, N1, M0) - Signed by Chauncey Cruel, MD on 11/17/2014    Indication for treatment:  Curative      Radiation treatment dates:   04/18/2015-05/26/2015  Site/dose:     Right chest wall/50.4 Pearline Cables @ 1.8 Pearline Cables per fraction x 28 fractions Right supraclavicular fossa/PAB 45 Gy @1 .8 Gy per fraction x 25 fractions  Beams/energy:  Opposed Tangents / 6 MV photons LAO/PA  6 and 10 MV photons  Narrative: The patient tolerated radiation treatment relatively well.   She had some dry desquamation over the chest wall and supraclavicular fossa which was treated with radiaplex.   Plan: The patient has completed radiation treatment. The patient will return to radiation oncology clinic for routine followup in one month. I advised them to call or return sooner if they have any questions or concerns related to their recovery or treatment.  ------------------------------------------------  Thea Silversmith, MD

## 2015-05-30 ENCOUNTER — Ambulatory Visit: Payer: BLUE CROSS/BLUE SHIELD

## 2015-05-31 ENCOUNTER — Ambulatory Visit: Payer: BLUE CROSS/BLUE SHIELD

## 2015-06-01 ENCOUNTER — Ambulatory Visit: Payer: BLUE CROSS/BLUE SHIELD

## 2015-06-01 ENCOUNTER — Telehealth: Payer: Self-pay | Admitting: *Deleted

## 2015-06-01 NOTE — Telephone Encounter (Signed)
Patient called and needs appt for [facility] flush tomorrow along with her visit with Dr. Jana Hakim. Will put in urgent POF for tomorrow's am visit.  Call back is [phone]

## 2015-06-02 ENCOUNTER — Ambulatory Visit (HOSPITAL_BASED_OUTPATIENT_CLINIC_OR_DEPARTMENT_OTHER): Payer: BLUE CROSS/BLUE SHIELD | Admitting: Oncology

## 2015-06-02 ENCOUNTER — Telehealth: Payer: Self-pay | Admitting: Oncology

## 2015-06-02 ENCOUNTER — Other Ambulatory Visit (HOSPITAL_BASED_OUTPATIENT_CLINIC_OR_DEPARTMENT_OTHER): Payer: BLUE CROSS/BLUE SHIELD

## 2015-06-02 ENCOUNTER — Ambulatory Visit (HOSPITAL_BASED_OUTPATIENT_CLINIC_OR_DEPARTMENT_OTHER): Payer: BLUE CROSS/BLUE SHIELD

## 2015-06-02 ENCOUNTER — Ambulatory Visit: Payer: BLUE CROSS/BLUE SHIELD

## 2015-06-02 VITALS — BP 105/52 | HR 76 | Temp 98.0°F | Resp 18 | Ht 61.0 in | Wt 131.0 lb

## 2015-06-02 DIAGNOSIS — K900 Celiac disease: Secondary | ICD-10-CM | POA: Diagnosis not present

## 2015-06-02 DIAGNOSIS — Z803 Family history of malignant neoplasm of breast: Secondary | ICD-10-CM

## 2015-06-02 DIAGNOSIS — C50511 Malignant neoplasm of lower-outer quadrant of right female breast: Secondary | ICD-10-CM

## 2015-06-02 DIAGNOSIS — Z853 Personal history of malignant neoplasm of breast: Secondary | ICD-10-CM | POA: Diagnosis not present

## 2015-06-02 DIAGNOSIS — Z95828 Presence of other vascular implants and grafts: Secondary | ICD-10-CM

## 2015-06-02 LAB — COMPREHENSIVE METABOLIC PANEL (CC13)
ALBUMIN: 3.7 g/dL (ref 3.5–5.0)
ALT: 25 U/L (ref 0–55)
AST: 26 U/L (ref 5–34)
Alkaline Phosphatase: 88 U/L (ref 40–150)
Anion Gap: 6 mEq/L (ref 3–11)
BUN: 11.6 mg/dL (ref 7.0–26.0)
CALCIUM: 9.1 mg/dL (ref 8.4–10.4)
CHLORIDE: 109 meq/L (ref 98–109)
CO2: 24 mEq/L (ref 22–29)
CREATININE: 0.6 mg/dL (ref 0.6–1.1)
EGFR: >90 mL/min/{1.73_m2} (ref >90)
Glucose: 95 mg/dl (ref 70–140)
Potassium: 4.1 mEq/L (ref 3.5–5.1)
SODIUM: 139 meq/L (ref 136–145)
Total Bilirubin: 0.52 mg/dL (ref 0.20–1.20)
Total Protein: 6.2 g/dL — ABNORMAL LOW (ref 6.4–8.3)

## 2015-06-02 LAB — CBC WITH DIFFERENTIAL/PLATELET
BASO%: 0.6 % (ref 0.0–2.0)
BASOS ABS: 0 10*3/uL (ref 0.0–0.1)
EOS%: 6.2 % (ref 0.0–7.0)
Eosinophils Absolute: 0.3 10*3/uL (ref 0.0–0.5)
HCT: 37.9 % (ref 34.8–46.6)
HGB: 13.2 g/dL (ref 11.6–15.9)
LYMPH#: 0.6 10*3/uL — AB (ref 0.9–3.3)
LYMPH%: 11 % — ABNORMAL LOW (ref 14.0–49.7)
MCH: 30.8 pg (ref 25.1–34.0)
MCHC: 34.7 g/dL (ref 31.5–36.0)
MCV: 88.6 fL (ref 79.5–101.0)
MONO#: 0.4 10*3/uL (ref 0.1–0.9)
MONO%: 8.5 % (ref 0.0–14.0)
NEUT%: 73.7 % (ref 38.4–76.8)
NEUTROS ABS: 3.7 10*3/uL (ref 1.5–6.5)
Platelets: 140 10*3/uL — ABNORMAL LOW (ref 145–400)
RBC: 4.27 10*6/uL (ref 3.70–5.45)
RDW: 14.3 % (ref 11.2–14.5)
WBC: 5.1 10*3/uL (ref 3.9–10.3)

## 2015-06-02 MED ORDER — HEPARIN SOD (PORK) LOCK FLUSH 100 UNIT/ML IV SOLN
500.0000 [IU] | Freq: Once | INTRAVENOUS | Status: AC
  Administered 2015-06-02: 500 [IU] via INTRAVENOUS
  Filled 2015-06-02: qty 5

## 2015-06-02 MED ORDER — SODIUM CHLORIDE 0.9 % IJ SOLN
10.0000 mL | INTRAMUSCULAR | Status: DC | PRN
  Administered 2015-06-02: 10 mL via INTRAVENOUS
  Filled 2015-06-02: qty 10

## 2015-06-02 NOTE — Progress Notes (Signed)
Misty [name]  [phone] Fax:(336) [phone]     ID: Misty [name] DOB: 1976-03-31  MR#: [id number]  LGX#:[id number]  Patient Care Team: Glenda Chroman, MD as PCP - General (Internal Medicine) Fanny Skates, MD as Consulting Physician (General Surgery) Chauncey Cruel, MD as Consulting Physician (Oncology) PCP: Glenda Chroman., MD Santo Held, MD (GYN) Theodoro Kos M.D. (Plastics)  CHIEF COMPLAINT: Estrogen receptor positive breast cancer  CURRENT TREATMENT: Tamoxifen  BREAST CANCER HISTORY: From the original intake note:  Misty [name] was closely followed for some right breast changes between 20 11/12/2012. Everything seemed stable at that time. In 2014 she was promoted in her job, was working very hard, and actually missed having a mammogram. Late in January 2015 she noted some pain in her right breast and by February when she lifted her arms she noted that the nipple was tilting sideways and seemed a little bit of gathered. She didn't think much of this however. It was not until July that she noted a palpable mass in the lateral right breast. She brought this to her gynecologist's attention and she was set up for diagnostic mammography at [facility] 08/04/2014.   There was an area of distortion in the lateral aspect of the right breast noted on mammography,  corresponding to a palpable firm mass at the 8:00 position of the right breast. Ultrasound confirmed a 2.4 cm irregular hypoechoic spiculated mass in the right breast at the 8:00 position and in addition at the 5:00 position 1 cm from the nipple there was a taller than wide irregular hypoechoic mass measuring 0.8 cm. There was no right axillary adenopathy. In the left breast upper-outer quadrant there was a minimally complicated cyst measuring 0.7 cm.  On 08/11/2014 the patient underwent biopsy of the 2 right breast masses noted on ultrasound. Both showed invasive ductal carcinoma, grade 1, estrogen  receptor 90% positive, progesterone receptor 90% positive, both with strong staining intensity, HER-2 equivocal at 2+ but negative by FISH, with the signals ratio being 1.20 and the number per nucleus 2.1. The proliferation fraction by MIB-1 was 17% ([id number]; this report was reviewed by our pathologist, who concurred, [name] [phone]).  On 08/25/2014 the patient underwent bilateral breast MRI at [place] [facility]. This showed, in the right breast, an irregular enhancing mass at the 8:00 position measuring 2.7 cm. Also in the right breast at the 6:00 position there was another enhancing mass measuring 0.6 cm, located 2 cm away from the larger mass. There were also masses in the retroareolar position measuring 0.7 cm, in the posterior central right breast measuring 0.5 cm, and another one just inferior to the midline measuring 5.5 cm. There were no masses or findings of concern in the left breast and no abnormal appearing lymph nodes.  The patient's subsequent history is as detailed below  INTERVAL HISTORY: Misty [name] returns today for follow up of her breast cancer, accompanied by her mother. Since her last visit here she completed her radiation treatments. She did have some skin changes but these are resolving. She felt fatigued. For her while she was taking naps in the afternoon. She still going to bed a little earlier than she used to and she wakes up feeling a little tired, but she is back to work full-time. She is not otherwise exercising. "I don't have time".  REVIEW OF SYSTEMS: Misty [name] is having some headaches perhaps 2 or 3 times a week, which are easily controlled on Tylenol. There has been no visual change,  nausea, vomiting, dizziness, gait imbalance, or nuchal stiffness. I suggested she consider Claritin for those. Her periods have not resumed. She does have some abdominal cramps which are very different from her prior premenstrual cramps. She does have a history of celiac disease of course. There  have been no dietary indiscretions. A detailed review of systems today was otherwise stable.  PAST MEDICAL HISTORY: Past Medical History  Diagnosis Date  . Thyroid disease   . Celiac disease   . PONV (postoperative nausea and vomiting)   . Hypothyroidism   . Anxiety   . Headache(784.0)   . Cancer     breast cancer    PAST SURGICAL HISTORY: Past Surgical History  Procedure Laterality Date  . Cesarean section    . Gallbladder surgery    . Cholecystectomy    . Simple mastectomy with axillary sentinel node biopsy Right 09/28/2014    Procedure: RIGHT TOTAL MASTECTOMY, RIGHT  AXILLARY SENTINEL NODE BIOPSY;  Surgeon: Fanny Skates, MD;  Location: [facility];  Service: General;  Laterality: Right;  . Portacath placement Right 10/22/2014    Procedure: INSERTION PORT-A-CATH;  Surgeon: Fanny Skates, MD;  Location: [facility];  Service: General;  Laterality: Right;    FAMILY HISTORY Family History  Problem Relation Age of Onset  . Skin cancer Father   . Breast cancer Maternal Grandmother     dx unknown age  . Breast cancer Other 1    mat great aunt through [facility] with breast cancer   the patient's parents are both living, in their late 66s. The patient had one brother, no sisters. The patient's maternal grandmother was diagnosed with breast cancer in her late 62s. One of that grandmother's sisters was also diagnosed with breast cancer, in her 29s. There is no other history of breast or ovarian cancer in the family  GYNECOLOGIC HISTORY:  No LMP recorded. Patient is not currently having periods (Reason: Chemotherapy). Menarche age 89, first live birth age 20, the patient is GX P1. 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 Her  husband, Misty [name], works as a Engineer, structural in [place]. Their son Misty [name] is 32. The patient attends a local [place]: Not in place   HEALTH MAINTENANCE: History  Substance Use Topics  . Smoking status: Never Smoker   . Smokeless tobacco: Not on file  . Alcohol Use: No     Colonoscopy:  PAP: 07/29/2014  Bone density:  Lipid panel:  Allergies  Allergen Reactions  . Gluten Meal   . Penicillins Nausea And Vomiting    Current Outpatient Prescriptions  Medication Sig Dispense Refill  . levothyroxine (SYNTHROID) 100 MCG tablet Take 1 tablet (100 mcg total) by mouth daily before breakfast. 30 tablet 6  . non-metallic deodorant (ALRA) MISC Apply 1 application topically daily as needed.    . Wound Cleansers (RADIAPLEX EX) Apply topically.     No current facility-administered medications for this visit.    OBJECTIVE: Middle-aged white woman who appears stated age 39 Vitals:   06/02/15 0928  BP: 105/52  Pulse: 76  Temp: 98 F (36.7 C)  Resp: 18     Body mass index is 24.76 kg/(m^2).    ECOG FS:1 - Symptomatic but completely  ambulatory  Sclerae unicteric, EOMs intact Oropharynx clear, dentition in good repair No cervical or supraclavicular adenopathy Lungs no rales or rhonchi Heart regular rate and rhythm Abd soft, nontender, positive bowel sounds MSK no focal spinal tenderness, no upper extremity lymphedema Neuro: nonfocal, well oriented, appropriate affect Breasts: The right breast is status post mastectomy and radiation. There is significant hyperpigmentation over the radiation port and there is dry desquamation in the right axilla. There is no evidence of local recurrence. The left breast is unremarkable   LAB RESULTS:  CMP     Component Value Date/Time   NA 139 06/02/2015 0853   NA 142 09/23/2014 1456   K 4.1 06/02/2015 0853   K 3.9 09/23/2014 1456   CL 104 09/23/2014 1456   CO2 24 06/02/2015 0853   CO2  27 09/23/2014 1456   GLUCOSE 95 06/02/2015 0853   GLUCOSE 71 09/23/2014 1456   BUN 11.6 06/02/2015 0853   BUN 11 09/23/2014 1456   CREATININE 0.6 06/02/2015 0853   CREATININE 0.70 09/23/2014 1456   CALCIUM 9.1 06/02/2015 0853   CALCIUM 9.8 09/23/2014 1456   PROT 6.2* 06/02/2015 0853   PROT 7.7 09/23/2014 1456   ALBUMIN 3.7 06/02/2015 0853   ALBUMIN 4.0 09/23/2014 1456   AST 26 06/02/2015 0853   AST 19 09/23/2014 1456   ALT 25 06/02/2015 0853   ALT 12 09/23/2014 1456   ALKPHOS 88 06/02/2015 0853   ALKPHOS 60 09/23/2014 1456   BILITOT 0.52 06/02/2015 0853   BILITOT 0.5 09/23/2014 1456   GFRNONAA >90 09/23/2014 1456   GFRAA >90 09/23/2014 1456    I No results found for: SPEP  Lab Results  Component Value Date   WBC 5.1 06/02/2015   NEUTROABS 3.7 06/02/2015   HGB 13.2 06/02/2015   HCT 37.9 06/02/2015   MCV 88.6 06/02/2015   PLT 140* 06/02/2015      Chemistry      Component Value Date/Time   NA 139 06/02/2015 0853   NA 142 09/23/2014 1456   K 4.1 06/02/2015 0853   K 3.9 09/23/2014 1456   CL 104 09/23/2014 1456   CO2 24 06/02/2015 0853   CO2 27 09/23/2014 1456   BUN 11.6 06/02/2015 0853   BUN 11 09/23/2014 1456   CREATININE 0.6 06/02/2015 0853   CREATININE 0.70 09/23/2014 1456      Component Value Date/Time   CALCIUM 9.1 06/02/2015 0853   CALCIUM 9.8 09/23/2014 1456   ALKPHOS 88 06/02/2015 0853   ALKPHOS 60 09/23/2014 1456   AST 26 06/02/2015 0853   AST 19 09/23/2014 1456   ALT 25 06/02/2015 0853   ALT 12 09/23/2014 1456   BILITOT 0.52 06/02/2015 0853   BILITOT 0.5 09/23/2014 1456       No results found for: LABCA2  No components found for: LABCA125  No results for input(s): INR in the last 168 hours.  Urinalysis No results found for: COLORURINE  STUDIES: No results found.  ASSESSMENT: 39 y.o. BRCA negative [place], [place] woman status post biopsy of 2 separate right breast masses 08/11/2014, both positive for an invasive ductal carcinoma,  grade 1, estrogen and progesterone receptor strongly positive, with an MIB-1 of 19%, and HER-2 nonamplified  (1) genetics testing sent 08/26/2014, showing no mutations in the BRCA genes; there were also no mutations in ATM, BARD 1, BRIP1, CDH 1, CHE K2, MR E11A, M UT YH, N BN, NF1, PA L B2, PTE N, RAD 50, RAD 50 1C, RAD 50 1D,  and T p53.  (2) status post right mastectomy with sentinel lymph node sampling 09/28/2014 for an mpT2 pN1, stage IIB invasive breast cancer (with both ductal and lobular features), grade 2, with negative margins  (3) adjuvant chemotherapy consisted of doxorubicin and cyclophosphamide in dose dense fashion 4, completed 12/30/2014, followed by paclitaxel weekly x 12 started 01/21/2015.  (a) paclitaxel stopped after 5 treatments due to persistent neuropathy, last dose 02/18/2015  (4) adjuvant radiation given 04/18/2015-05/26/2015: Site/dose:  Right chest wall/50.4 Pearline Cables @ 1.8 Pearline Cables per fraction x 28 fractions Right supraclavicular fossa/PAB 45 Gy @1 .8 Gy per fraction x 25 fractions Right scar boost / 10 [employer] per fraction x 5 fractions  (5) to start tamoxifen 07/07/2015  (6) history of celiac disease  PLAN: Kavya has completed her local treatment. She is now ready to consider anti-estrogens. We reviewed the physiology of breast cancer and she understands her tumor is estrogen dependent and that she is still making plenty estrogen even though she is currently postmenopausal. Tamoxifen "turns off" the estrogen receptor in breast cancer cells. Ultimately its effect is to reduce the risk of recurrence by half.  We discussed the possible side effects, toxicities and complications of this agent. Because she is still recovering from radiation I think a good date for her to consider starting would be 07/07/2015. We also discussed, and menopausal symptoms so she would not confuse them as being caused by tamoxifen.  She has not resumed periods. She understands if her  periods do resume we will have to do either goserelin or bilateral salpingo-oophorectomy, the latter being preferred. She also understands that tamoxifen is not a contraceptive and that she could have a "silent" ovulation and get pregnant while on tamoxifen. Barrier methods of contraception of course are fine as in my opinion would be either copper IUD or even a Mirena IUD.  At this point she has not decided whether or not she wants reconstruction, which would involve a latissimus flap.  She knows to call for any other problems that may develop before her next visit here.    Marland KitchenChauncey Cruel, MD   06/02/2015 9:48 AM

## 2015-06-02 NOTE — Patient Instructions (Signed)

## 2015-06-02 NOTE — Telephone Encounter (Signed)
Appointments made and avs printed for patient °

## 2015-06-15 NOTE — Addendum Note (Signed)
Encounter addended by: Thea Silversmith, MD on: 06/15/2015  8:07 PM<BR>     Documentation filed: Notes Section

## 2015-07-04 ENCOUNTER — Telehealth: Payer: Self-pay | Admitting: Oncology

## 2015-07-04 NOTE — Telephone Encounter (Signed)
s.w. pt and advised on Sept appt moved to earlier time....pt ok and aware....mailed pt appt sched and letter

## 2015-07-07 ENCOUNTER — Ambulatory Visit: Payer: Self-pay | Admitting: Radiation Oncology

## 2015-07-11 ENCOUNTER — Telehealth: Payer: Self-pay

## 2015-07-11 DIAGNOSIS — C50511 Malignant neoplasm of lower-outer quadrant of right female breast: Secondary | ICD-10-CM

## 2015-07-11 MED ORDER — TAMOXIFEN CITRATE 20 MG PO TABS: 20.0000 mg | ORAL_TABLET | Freq: Every day | ORAL | Status: DC

## 2015-07-11 NOTE — Telephone Encounter (Signed)
Pt called stating Dr Jana Hakim wanted her to start tamoxifen on 7/14 but did not send in an RX. S/w Dr Jana Hakim and sent in Rx per instruction. Called pt back and pt had period starting June 9th for 3-4 days, starting AFTER she saw Dr Jana Hakim. Has not had period this month. Pt is questioning why Dr Jana Hakim mentioned that if her periods started again he would have to consider goserelin or BSO, and then he says to go ahead with the tamoxifen.

## 2015-07-12 ENCOUNTER — Telehealth: Payer: Self-pay | Admitting: *Deleted

## 2015-07-12 ENCOUNTER — Other Ambulatory Visit: Payer: Self-pay | Admitting: Oncology

## 2015-07-12 NOTE — Telephone Encounter (Signed)
TC to patient with ok to take Tamoxifen

## 2015-07-12 NOTE — Telephone Encounter (Signed)
VM message received @ 1:52 pm regarding taking her Tamoxifen since her menstrual cycle started again in June. See note from 07/11/15

## 2015-07-12 NOTE — Telephone Encounter (Signed)
Not sure why pt called back, if this was already discussed yesterdya   No problem w taking TAM while premenopausal.   Thanks!

## 2015-07-13 ENCOUNTER — Other Ambulatory Visit: Payer: Self-pay | Admitting: Oncology

## 2015-07-13 DIAGNOSIS — C50919 Malignant neoplasm of unspecified site of unspecified female breast: Secondary | ICD-10-CM

## 2015-07-14 ENCOUNTER — Ambulatory Visit (HOSPITAL_BASED_OUTPATIENT_CLINIC_OR_DEPARTMENT_OTHER): Payer: BLUE CROSS/BLUE SHIELD

## 2015-07-14 ENCOUNTER — Telehealth: Payer: Self-pay | Admitting: *Deleted

## 2015-07-14 ENCOUNTER — Ambulatory Visit
Admission: RE | Admit: 2015-07-14 | Discharge: 2015-07-14 | Disposition: A | Discharge status: Home / Self Care | Payer: BLUE CROSS/BLUE SHIELD | Source: Ambulatory Visit | Attending: Radiation Oncology | Admitting: Radiation Oncology

## 2015-07-14 VITALS — BP 105/37 | HR 72 | Temp 97.7°F | Resp 20 | Ht 61.0 in | Wt 129.6 lb

## 2015-07-14 VITALS — BP 100/56 | HR 79 | Temp 97.9°F | Resp 16

## 2015-07-14 DIAGNOSIS — Z853 Personal history of malignant neoplasm of breast: Secondary | ICD-10-CM | POA: Diagnosis not present

## 2015-07-14 DIAGNOSIS — Z95828 Presence of other vascular implants and grafts: Secondary | ICD-10-CM

## 2015-07-14 DIAGNOSIS — Z452 Encounter for adjustment and management of vascular access device: Secondary | ICD-10-CM

## 2015-07-14 DIAGNOSIS — C50511 Malignant neoplasm of lower-outer quadrant of right female breast: Secondary | ICD-10-CM

## 2015-07-14 HISTORY — DX: Reserved for inherently not codable concepts without codable children: IMO0001

## 2015-07-14 HISTORY — DX: Reserved for concepts with insufficient information to code with codable children: IMO0002

## 2015-07-14 MED ORDER — HEPARIN SOD (PORK) LOCK FLUSH 100 UNIT/ML IV SOLN
500.0000 [IU] | Freq: Once | INTRAVENOUS | Status: AC
  Administered 2015-07-14: 500 [IU] via INTRAVENOUS
  Filled 2015-07-14: qty 5

## 2015-07-14 MED ORDER — SODIUM CHLORIDE 0.9 % IJ SOLN
10.0000 mL | INTRAMUSCULAR | Status: DC | PRN
  Administered 2015-07-14: 10 mL via INTRAVENOUS
  Filled 2015-07-14: qty 10

## 2015-07-14 NOTE — Telephone Encounter (Signed)
Called Dr. Jana Hakim nurse phone,left vm for Val DoddRN to call back [phone], per Dr. Pablo Ledger asked if Val could see patient today, patient has questions and playing phone tag, patient to get flush today at 2 1:26 PM

## 2015-07-14 NOTE — Progress Notes (Signed)
folow up s/p rad right chest wall  04/18/15-05/26/15, well healed,  Hasn't started tamoxifen as yet is being referred to [facility] MD , Dr. Everitt Amber they will call her since she started her menses again last period June 08/2015, no pain, slight fatigue, appetite good , has scheduled port flush today at 2pm next Med Onc appt 9/1/1/6 BP 105/37 mmHg  Pulse 72  Temp(Src) 97.7 F (36.5 C) (Oral)  Resp 20  Ht 5\' 1"  (1.549 m)  Wt 129 lb 9.6 oz (58.786 kg)  BMI 24.50 kg/m2  LMP 06/02/2015  Wt Readings from Last 3 Encounters:  07/14/15 129 lb 9.6 oz (58.786 kg)  06/02/15 131 lb (59.421 kg)  05/17/15 130 lb 8 oz (59.194 kg)   1:14 PM

## 2015-07-14 NOTE — Progress Notes (Signed)
   Department of Radiation Oncology  Phone:  [phone] Fax:        [phone]   Name: Misty [name] MRN: [id number]  DOB: March 07, 1976  Date: 07/14/2015  Follow Up Visit Note  Diagnosis: Breast cancer of lower-outer quadrant of right female breast   Staging form: Breast, AJCC 7th Edition     Clinical: Stage IIB (T2, N1, M0) - Signed by Chauncey Cruel, MD on 11/17/2014  Summary and Interval since last radiation: Radiation treatment dates extended from 04/18/2015 to 05/26/2015. She received 45 gy to the SCLV and axilla and 50.4 Gy to the chest wall.  Interval History: Misty [name] presents today for routine followup.  She has healed up well. She is using lotion with vitamin E. She has received a prescription for tamoxifen but had questions about waiting until she saw the GYN surgeon on whether she should start taking it or wait until she had her ovaries out.  She is unaccompanied.  She was recently at the beach and used sunscreen.   Physical Exam:  Filed Vitals:   07/14/15 1309  BP: 105/37  Pulse: 72  Temp: 97.7 F (36.5 C)  TempSrc: Oral  Resp: 20  Height: 5\' 1"  (1.549 m)  Weight: 129 lb 9.6 oz (58.786 kg)   Well healed chest wall. No evidence of recurrence. Pleasant.   IMPRESSION: Misty [name] is a 39 y.o. female presenting to clinic in regards to her T2N1M0 cancer of the right female breast with resovling acute effects of treatment.   PLAN: She is doing well. We discussed the need for follow up every 4-6 months which she has scheduled.  We discussed the need for yearly mammogram of the left breast which she can schedule with her OBGYN or with medical oncology. We discussed the need for sun protection in the treated area.  We discussed that she should begin her tamoxifen now and not wait a few months until after her hysterectomy. She is scheduled for port flush today and will clarify with medical oncology while she is up there. She can always call me with questions.  I will follow up  with her on an as needed basis.   This document serves as a record of services personally performed by Thea Silversmith, MD. It was created on her behalf by Darcus Austin, a trained medical scribe. The creation of this record is based on the scribe's personal observations and the provider's statements to them. This document has been checked and approved by the attending provider.   ------------------------------------------------  Thea Silversmith, MD

## 2015-07-14 NOTE — Patient Instructions (Signed)

## 2015-07-14 NOTE — Telephone Encounter (Signed)
This RN attempted to return call to pt per further inquiry regarding her question on " I thought Dr Jana Hakim didn't want me to resume my periods while on the tamoxifen "  Obtained identified VM of pt per work number- this RN's name and return call number left to discuss with her - her inquiry.  Note this RN has data on TEXT/SOFT study regarding premenopausal women with hormone positive breast cancer and possible benefit of inducing menopause.

## 2015-07-15 ENCOUNTER — Telehealth: Payer: Self-pay | Admitting: *Deleted

## 2015-07-15 NOTE — Telephone Encounter (Signed)
NOTIFIED DR.MAGRINAT'S NURSE, VAL DODD,RN THAT PT. WAS RETURNING HER CALL. VAL WILL CALL PT.

## 2015-07-18 ENCOUNTER — Other Ambulatory Visit: Payer: Self-pay | Admitting: Oncology

## 2015-07-20 ENCOUNTER — Telehealth: Payer: Self-pay | Admitting: *Deleted

## 2015-07-20 NOTE — Telephone Encounter (Signed)
Received call from patient inquiring about an appointment with Dr. Denman George. She states that Dr. Jana Hakim told her he was referring her to see Dr. Denman George. Told patient that I have printed off her information for Dr. Denman George to review tomorrow while in clinic and I will call her back once her information has been reviewed by MD.

## 2015-07-21 ENCOUNTER — Other Ambulatory Visit: Payer: Self-pay | Admitting: *Deleted

## 2015-07-21 DIAGNOSIS — N959 Unspecified menopausal and perimenopausal disorder: Secondary | ICD-10-CM

## 2015-07-21 DIAGNOSIS — Z803 Family history of malignant neoplasm of breast: Secondary | ICD-10-CM

## 2015-07-21 DIAGNOSIS — C50511 Malignant neoplasm of lower-outer quadrant of right female breast: Secondary | ICD-10-CM

## 2015-08-02 ENCOUNTER — Other Ambulatory Visit: Payer: Self-pay | Admitting: Obstetrics and Gynecology

## 2015-08-03 LAB — CYTOLOGY - PAP

## 2015-08-16 ENCOUNTER — Telehealth: Payer: Self-pay | Admitting: Adult Health

## 2015-08-16 NOTE — Telephone Encounter (Signed)
I briefly spoke with Ms. Misty [name] regarding her eligibility to come see [place] in survivorship now that she has completed treatment for breast cancer.  I described the purpose and goals of survivorship and we hoped to help her with during a visit in the survivorship clinic.  However, Ms. Misty [name] let me know that at this time she is not interested in coming in for an in-person visit.  I offered to mail her a copy of her survivorship care plan and she was agreeable to that plan.   Therefore, we will plan on mailing a copy of her survivorship care plan to her.  I encouraged her to reach out at any time with any questions or concerns regarding life after cancer/survivorship and we could see her at any time in the future.    Mike Craze, NP [place] [phone]

## 2015-08-25 ENCOUNTER — Telehealth: Payer: Self-pay | Admitting: Nurse Practitioner

## 2015-08-25 ENCOUNTER — Other Ambulatory Visit: Payer: BLUE CROSS/BLUE SHIELD

## 2015-08-25 ENCOUNTER — Ambulatory Visit (HOSPITAL_BASED_OUTPATIENT_CLINIC_OR_DEPARTMENT_OTHER): Payer: BLUE CROSS/BLUE SHIELD

## 2015-08-25 ENCOUNTER — Other Ambulatory Visit (HOSPITAL_BASED_OUTPATIENT_CLINIC_OR_DEPARTMENT_OTHER): Payer: BLUE CROSS/BLUE SHIELD

## 2015-08-25 ENCOUNTER — Encounter: Payer: Self-pay | Admitting: Nurse Practitioner

## 2015-08-25 ENCOUNTER — Encounter: Payer: BLUE CROSS/BLUE SHIELD | Admitting: Oncology

## 2015-08-25 ENCOUNTER — Ambulatory Visit (HOSPITAL_BASED_OUTPATIENT_CLINIC_OR_DEPARTMENT_OTHER): Payer: BLUE CROSS/BLUE SHIELD | Admitting: Nurse Practitioner

## 2015-08-25 VITALS — BP 99/56 | HR 75 | Temp 98.4°F | Resp 18 | Ht 61.0 in | Wt 126.4 lb

## 2015-08-25 DIAGNOSIS — N951 Menopausal and female climacteric states: Secondary | ICD-10-CM | POA: Diagnosis not present

## 2015-08-25 DIAGNOSIS — C50511 Malignant neoplasm of lower-outer quadrant of right female breast: Secondary | ICD-10-CM

## 2015-08-25 DIAGNOSIS — Z79810 Long term (current) use of selective estrogen receptor modulators (SERMs): Secondary | ICD-10-CM | POA: Diagnosis not present

## 2015-08-25 DIAGNOSIS — Z853 Personal history of malignant neoplasm of breast: Secondary | ICD-10-CM | POA: Diagnosis not present

## 2015-08-25 DIAGNOSIS — K900 Celiac disease: Secondary | ICD-10-CM

## 2015-08-25 DIAGNOSIS — Z95828 Presence of other vascular implants and grafts: Secondary | ICD-10-CM

## 2015-08-25 DIAGNOSIS — R232 Flushing: Secondary | ICD-10-CM | POA: Insufficient documentation

## 2015-08-25 DIAGNOSIS — Z803 Family history of malignant neoplasm of breast: Secondary | ICD-10-CM

## 2015-08-25 DIAGNOSIS — N6459 Other signs and symptoms in breast: Secondary | ICD-10-CM

## 2015-08-25 LAB — COMPREHENSIVE METABOLIC PANEL (CC13)
ALBUMIN: 3.5 g/dL (ref 3.5–5.0)
ALK PHOS: 50 U/L (ref 40–150)
ALT: 9 U/L (ref 0–55)
AST: 13 U/L (ref 5–34)
Anion Gap: 5 mEq/L (ref 3–11)
BILIRUBIN TOTAL: 0.37 mg/dL (ref 0.20–1.20)
BUN: 7 mg/dL (ref 7.0–26.0)
CALCIUM: 8.7 mg/dL (ref 8.4–10.4)
CO2: 23 mEq/L (ref 22–29)
CREATININE: 0.7 mg/dL (ref 0.6–1.1)
Chloride: 111 mEq/L — ABNORMAL HIGH (ref 98–109)
EGFR: >90 mL/min/{1.73_m2} (ref >90)
Glucose: 114 mg/dl (ref 70–140)
Potassium: 3.9 mEq/L (ref 3.5–5.1)
Sodium: 140 mEq/L (ref 136–145)
TOTAL PROTEIN: 5.9 g/dL — AB (ref 6.4–8.3)

## 2015-08-25 LAB — CBC WITH DIFFERENTIAL/PLATELET
BASO%: 0.4 % (ref 0.0–2.0)
BASOS ABS: 0 10*3/uL (ref 0.0–0.1)
EOS%: 2.8 % (ref 0.0–7.0)
Eosinophils Absolute: 0.2 10*3/uL (ref 0.0–0.5)
HEMATOCRIT: 35.5 % (ref 34.8–46.6)
HEMOGLOBIN: 12.5 g/dL (ref 11.6–15.9)
LYMPH#: 1.1 10*3/uL (ref 0.9–3.3)
LYMPH%: 19.6 % (ref 14.0–49.7)
MCH: 32.1 pg (ref 25.1–34.0)
MCHC: 35.2 g/dL (ref 31.5–36.0)
MCV: 91 fL (ref 79.5–101.0)
MONO#: 0.3 10*3/uL (ref 0.1–0.9)
MONO%: 5.1 % (ref 0.0–14.0)
NEUT%: 72.1 % (ref 38.4–76.8)
NEUTROS ABS: 4.1 10*3/uL (ref 1.5–6.5)
Platelets: 145 10*3/uL (ref 145–400)
RBC: 3.9 10*6/uL (ref 3.70–5.45)
RDW: 13.4 % (ref 11.2–14.5)
WBC: 5.7 10*3/uL (ref 3.9–10.3)

## 2015-08-25 MED ORDER — SODIUM CHLORIDE 0.9 % IJ SOLN
10.0000 mL | INTRAMUSCULAR | Status: DC | PRN
  Administered 2015-08-25: 10 mL via INTRAVENOUS
  Filled 2015-08-25: qty 10

## 2015-08-25 MED ORDER — HEPARIN SOD (PORK) LOCK FLUSH 100 UNIT/ML IV SOLN
500.0000 [IU] | Freq: Once | INTRAVENOUS | Status: AC
  Administered 2015-08-25: 500 [IU] via INTRAVENOUS
  Filled 2015-08-25: qty 5

## 2015-08-25 NOTE — Telephone Encounter (Signed)
Gave avs & calendar for December.

## 2015-08-25 NOTE — Patient Instructions (Signed)

## 2015-08-25 NOTE — Progress Notes (Signed)
Misty [name]  [phone] [phone]) [phone]     ID: Misty [name] DOB: 1976-05-03  MR#: [id number]  ZLD#:[id number]  Patient Care Team: Misty Chroman, MD as PCP - General (Internal Medicine) Misty Skates, MD as Consulting Physician (General Surgery) Misty Cruel, MD as Consulting Physician (Oncology) PCP: Misty [name]., MD Misty Held, MD (GYN) Misty [name] M.D. (Plastics)  CHIEF COMPLAINT: Estrogen receptor positive breast cancer  CURRENT TREATMENT: Tamoxifen  BREAST CANCER HISTORY: From the original intake note:  Misty [name] was closely followed for some right breast changes between 20 11/12/2012. Everything seemed stable at that time. In 2014 she was promoted in her job, was working very hard, and actually missed having a mammogram. Late in January 2015 she noted some pain in her right breast and by February when she lifted her arms she noted that the nipple was tilting sideways and seemed a little bit of gathered. She didn't think much of this however. It was not until July that she noted a palpable mass in the lateral right breast. She brought this to her gynecologist's attention and she was set up for diagnostic mammography at [facility] 08/04/2014.   There was an area of distortion in the lateral aspect of the right breast noted on mammography,  corresponding to a palpable firm mass at the 8:00 position of the right breast. Ultrasound confirmed a 2.4 cm irregular hypoechoic spiculated mass in the right breast at the 8:00 position and in addition at the 5:00 position 1 cm from the nipple there was a taller than wide irregular hypoechoic mass measuring 0.8 cm. There was no right axillary adenopathy. In the left breast upper-outer quadrant there was a minimally complicated cyst measuring 0.7 cm.  On 08/11/2014 the patient underwent biopsy of the 2 right breast masses noted on ultrasound. Both showed invasive ductal carcinoma, grade 1, estrogen  receptor 90% positive, progesterone receptor 90% positive, both with strong staining intensity, HER-2 equivocal at 2+ but negative by FISH, with the signals ratio being 1.20 and the number per nucleus 2.1. The proliferation fraction by MIB-1 was 17% ([id number]; this report was reviewed by our pathologist, who concurred, Misty [name] [phone]).  On 08/25/2014 the patient underwent bilateral breast MRI at [place]. This showed, in the right breast, an irregular enhancing mass at the 8:00 position measuring 2.7 cm. Also in the right breast at the 6:00 position there was another enhancing mass measuring 0.6 cm, located 2 cm away from the larger mass. There were also masses in the retroareolar position measuring 0.7 cm, in the posterior central right breast measuring 0.5 cm, and another one just inferior to the midline measuring 5.5 cm. There were no masses or findings of concern in the left breast and no abnormal appearing lymph nodes.  The patient's subsequent history is as detailed below  INTERVAL HISTORY: Misty [name] returns today for follow up of her breast cancer, accompanied by her husband. She started on tamoxifen in July of this year and tolerates that well. She has moderate hot flashes and vaginal wetness, but otherwise tolerates this well. She continues to have her periods monthly and they are just as intense as her periods were pre-cancer. She has talked to Dr. Ouida [name] about having her ovaries removed, but she wants to talk more about this. Misty [name] is worried about her left inverted nipple, which is new this year. She states this is how the right breast cancer was found. A mammogram in May returned normal, but it was suggested  she repeat this study in 6 months.   REVIEW OF SYSTEMS: Misty [name] has pain and swelling to random joints. Her right wrist, right hip, and left elbow have all caused discomfort over the past few months, but then resolve on their own. A detailed review of systems is otherwise entirely  negative, except where noted above.  PAST MEDICAL HISTORY: Past Medical History  Diagnosis Date  . Thyroid disease   . Celiac disease   . PONV (postoperative nausea and vomiting)   . Hypothyroidism   . Anxiety   . Headache(784.0)   . Cancer     breast cancer  . Radiation 04/18/15-05/26/15    right breast    PAST SURGICAL HISTORY: Past Surgical History  Procedure Laterality Date  . Cesarean section    . Gallbladder surgery    . Cholecystectomy    . Simple mastectomy with axillary sentinel node biopsy Right 09/28/2014    Procedure: RIGHT TOTAL MASTECTOMY, RIGHT  AXILLARY SENTINEL NODE BIOPSY;  Surgeon: Misty Skates, MD;  Location: [place];  Service: General;  Laterality: Right;  . Portacath placement Right 10/22/2014    Procedure: INSERTION PORT-A-CATH;  Surgeon: Misty Skates, MD;  Location: [place];  Service: General;  Laterality: Right;    FAMILY HISTORY Family History  Problem Relation Age of Onset  . Skin cancer Father   . Breast cancer Maternal Grandmother     dx unknown age  . Breast cancer Other 59    mat great aunt through [facility] with breast cancer   the patient's parents are both living, in their late 75s. The patient had one brother, no sisters. The patient's maternal grandmother was diagnosed with breast cancer in her late 7s. One of that grandmother's sisters was also diagnosed with breast cancer, in her 34s. There is no other history of breast or ovarian cancer in the family  GYNECOLOGIC HISTORY:  No LMP recorded. Patient is not currently having periods (Reason: Chemotherapy). Menarche age 36, first live birth age 58, the patient is GX P1. 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 Her husband,  Misty [name], works as a Engineer, structural in [place]. Their son Misty [name] is 88. The patient attends a local [place]: Not in place   HEALTH MAINTENANCE: Social History  Substance Use Topics  . Smoking status: Never Smoker   . Smokeless tobacco: Not on file  . Alcohol Use: No     Colonoscopy:  PAP: 07/29/2014  Bone density:  Lipid panel:  Allergies  Allergen Reactions  . Gluten Meal   . Penicillins Nausea And Vomiting    Current Outpatient Prescriptions  Medication Sig Dispense Refill  . levothyroxine (SYNTHROID) 100 MCG tablet Take 1 tablet (100 mcg total) by mouth daily before breakfast. 30 tablet 6  . non-metallic deodorant (ALRA) MISC Apply 1 application topically daily as needed.    . tamoxifen (NOLVADEX) 20 MG tablet Take 1 tablet (20 mg total) by mouth daily. (Patient not taking: Reported on 07/14/2015) 90 tablet 4  . Wound Cleansers (RADIAPLEX EX) Apply topically.     No current facility-administered medications for this visit.   Facility-Administered Medications Ordered in Other Visits  Medication Dose Route Frequency Provider Last Rate Last Dose  .  sodium chloride 0.9 % injection 10 mL  10 mL Intravenous PRN Misty Cruel, MD   10 mL at 08/25/15 1408    OBJECTIVE: Middle-aged white woman who appears stated age 39 Vitals:   08/25/15 1420  BP: 99/56  Pulse: 75  Temp: 98.4 F (36.9 C)  Resp: 18     Body mass index is 23.9 kg/(m^2).    ECOG FS:1 - Symptomatic but completely ambulatory  Skin: warm, dry  HEENT: sclerae anicteric, conjunctivae pink, oropharynx clear. No thrush or mucositis.  Lymph Nodes: No cervical or supraclavicular lymphadenopathy  Lungs: clear to auscultation bilaterally, no rales, wheezes, or rhonci  Heart: regular rate and rhythm  Abdomen: round, soft, non tender, positive bowel sounds  Musculoskeletal: No focal spinal tenderness, no peripheral edema  Neuro: non focal, well oriented,  positive affect  Breasts: right breast status post lumpectomy and radiation. No evidence of recurrent disease. Right axilla benign. Left nipple starting to invert at upper left quadrant, but still very much protruded. Left breast otherwise unremarkable. Left axilla benign  LAB RESULTS:  CMP     Component Value Date/Time   NA 140 08/25/2015 1405   NA 142 09/23/2014 1456   K 3.9 08/25/2015 1405   K 3.9 09/23/2014 1456   CL 104 09/23/2014 1456   CO2 23 08/25/2015 1405   CO2 27 09/23/2014 1456   GLUCOSE 114 08/25/2015 1405   GLUCOSE 71 09/23/2014 1456   BUN 7.0 08/25/2015 1405   BUN 11 09/23/2014 1456   CREATININE 0.7 08/25/2015 1405   CREATININE 0.70 09/23/2014 1456   CALCIUM 8.7 08/25/2015 1405   CALCIUM 9.8 09/23/2014 1456   PROT 5.9* 08/25/2015 1405   PROT 7.7 09/23/2014 1456   ALBUMIN 3.5 08/25/2015 1405   ALBUMIN 4.0 09/23/2014 1456   AST 13 08/25/2015 1405   AST 19 09/23/2014 1456   ALT 9 08/25/2015 1405   ALT 12 09/23/2014 1456   ALKPHOS 50 08/25/2015 1405   ALKPHOS 60 09/23/2014 1456   BILITOT 0.37 08/25/2015 1405   BILITOT 0.5 09/23/2014 1456   GFRNONAA >90 09/23/2014 1456   GFRAA >90 09/23/2014 1456    I No results found for: SPEP  Lab Results  Component Value Date   WBC 5.7 08/25/2015   NEUTROABS 4.1 08/25/2015   HGB 12.5 08/25/2015   HCT 35.5 08/25/2015   MCV 91.0 08/25/2015   PLT 145 08/25/2015      Chemistry      Component Value Date/Time   NA 140 08/25/2015 1405   NA 142 09/23/2014 1456   K 3.9 08/25/2015 1405   K 3.9 09/23/2014 1456   CL 104 09/23/2014 1456   CO2 23 08/25/2015 1405   CO2 27 09/23/2014 1456   BUN 7.0 08/25/2015 1405   BUN 11 09/23/2014 1456   CREATININE 0.7 08/25/2015 1405   CREATININE 0.70 09/23/2014 1456      Component Value Date/Time   CALCIUM 8.7 08/25/2015 1405   CALCIUM 9.8 09/23/2014 1456   ALKPHOS 50 08/25/2015 1405   ALKPHOS 60 09/23/2014 1456   AST 13 08/25/2015 1405   AST 19 09/23/2014 1456   ALT 9  08/25/2015 1405   ALT 12 09/23/2014 1456   BILITOT 0.37 08/25/2015 1405   BILITOT 0.5 09/23/2014 1456       No results found for: LABCA2  No components found for: LABCA125  No results for input(s): INR in the last 168 hours.  Urinalysis No results found for: COLORURINE  STUDIES: No  results found.   EXAM: DIGITAL DIAGNOSTIC LEFT MAMMOGRAM WITH 3D TOMOSYNTHESIS WITH CAD  ULTRASOUND LEFT BREAST  COMPARISON: August 04, 2014  ACR Breast Density Category c: The breast tissue is heterogeneously dense, which may obscure small masses.  FINDINGS: Cc and MLO views of the left breast, spot tangential view of left breast are submitted. No suspicious abnormality is identified.  Mammographic images were processed with CAD.  Targeted ultrasound is performed, showing no suspicious abnormality in the retroareolar left breast.  IMPRESSION: Probable benign findings.  RECOMMENDATION: Six month followup mammogram left breast.  I have discussed the findings and recommendations with the patient. Results were also provided in writing at the conclusion of the visit. If applicable, a reminder letter will be sent to the patient regarding the next appointment.  BI-RADS CATEGORY 3: Probably benign.   Electronically Signed  By: Abelardo Diesel M.D.  On: 05/02/2015 11:15  ASSESSMENT: 39 y.o. BRCA negative [place], [place] woman status post biopsy of 2 separate right breast masses 08/11/2014, both positive for an invasive ductal carcinoma, grade 1, estrogen and progesterone receptor strongly positive, with an MIB-1 of 19%, and HER-2 nonamplified  (1) genetics testing sent 08/26/2014, showing no mutations in the BRCA genes; there were also no mutations in ATM, BARD 1, BRIP1, [place] 1, CHE K2, MR E11A, M UT YH, N BN, NF1, PA L B2, PTE N, RAD 50, RAD 50 1C, RAD 50 1D, and T p53.  (2) status post right mastectomy with sentinel lymph node sampling 09/28/2014 for an mpT2 pN1, stage IIB  invasive breast cancer (with both ductal and lobular features), grade 2, with negative margins  (3) adjuvant chemotherapy consisted of doxorubicin and cyclophosphamide in dose dense fashion 4, completed 12/30/2014, followed by paclitaxel weekly x 12 started 01/21/2015.  (a) paclitaxel stopped after 5 treatments due to persistent neuropathy, last dose 02/18/2015  (4) adjuvant radiation given 04/18/2015-05/26/2015: Site/dose:  Right chest wall/50.4 [place] Cables @ 1.8 [place] Cables per fraction x 28 fractions Right supraclavicular fossa/PAB 45 Gy _0 .8 Gy per fraction x 25 fractions Right scar boost / 10 [place] [employer] per fraction x 5 fractions  (5) started tamoxifen 07/07/2015  (6) history of celiac disease  PLAN: Misty [name] is doing well overall today. The labs were reviewed in detail and were stable. She is tolerating the tamoxifen well, besides hot flashes. We discussed gabapentin, but she is more interested in "natural" substances such as black cohosh or vitamin E which are only minimally effective in some patients according to studies. She will let me know if she changes her mind.   We spent time discussing the idea of having her ovaries removed, due to the return of her periods. She has discussed this some with Dr. Ouida [name], but she has not scheduled an official surgery yet. After our discussion, she states that she will call him back and would like to know if she could have her port out at the same time. Otherwise she will need this flushed every 6-8 weeks.   Misty [name] will return in 3 months for labs and a follow up visit. Prior to this visit she is having a 6 month follow up ultrasound since her left nipple became inverted in May of this year. She understands and agrees with this plan. She knows the goal of treatment in her case is cure. She has been encouraged to call with any issues that might arise before her next visit here.   Marland KitchenLaurie Panda, NP   08/25/2015 4:42 PM

## 2015-08-26 ENCOUNTER — Other Ambulatory Visit: Payer: Self-pay | Admitting: Nurse Practitioner

## 2015-08-30 ENCOUNTER — Encounter: Payer: Self-pay | Admitting: Adult Health

## 2015-08-30 NOTE — Telephone Encounter (Signed)
Encounter opened in error. This encounter was created in error - please disregard. 

## 2015-08-30 NOTE — Progress Notes (Signed)
The Survivorship Care Plan was mailed to [name] as she reported not being able to come in to the Survivorship Clinic for an in-person visit at this time. A letter was mailed to her outlining the purpose of the content of the care plan, as well as encouraging her to reach out to me with any questions or concerns.  My business card was included in the correspondence to the patient as well.  A copy of the care plan was also routed/faxed/mailed to VYAS,DHRUV B., MD, the patient's PCP.  I will not be placing any follow-up appointments to the Survivorship Clinic for Misty [name], but I am happy to see her at any time in the future for any survivorship concerns that may arise. Thank you for allowing me to participate in her care!  Mike Craze, NP [place] [phone]

## 2015-08-31 ENCOUNTER — Encounter: Payer: Self-pay | Admitting: Oncology

## 2015-09-02 ENCOUNTER — Telehealth: Payer: Self-pay | Admitting: *Deleted

## 2015-09-02 NOTE — Telephone Encounter (Signed)
PT.'S "CYCLE IS GETTING WORSE". THE CYCLES ARE CLOSER TOGETHER AND HEAVIER. COULD DR.MAGRINAT'S OFFICE CALL DR.ANDERSON'S OFFICE TO SEE IF PT. COULD BE SEEN EARLIER BEFORE HER SCHEDULED APPOINTMENT OF 09/19/15?

## 2015-09-02 NOTE — Telephone Encounter (Signed)
Called Dr Tonette Bihari office and obtained an appointment for 09/07/2015. Called and informed pt with verbalized understanding.

## 2015-09-14 ENCOUNTER — Other Ambulatory Visit: Payer: Self-pay | Admitting: Obstetrics and Gynecology

## 2015-09-15 ENCOUNTER — Other Ambulatory Visit: Payer: Self-pay | Admitting: General Surgery

## 2015-09-15 ENCOUNTER — Encounter (HOSPITAL_COMMUNITY): Payer: Self-pay

## 2015-09-15 ENCOUNTER — Encounter (HOSPITAL_COMMUNITY)
Admission: RE | Admit: 2015-09-15 | Discharge: 2015-09-15 | Disposition: A | Discharge status: Home / Self Care | Payer: BLUE CROSS/BLUE SHIELD | Source: Ambulatory Visit | Attending: Obstetrics and Gynecology | Admitting: Obstetrics and Gynecology

## 2015-09-15 DIAGNOSIS — Z01818 Encounter for other preprocedural examination: Secondary | ICD-10-CM | POA: Insufficient documentation

## 2015-09-15 LAB — ABO/RH: ABO/RH(D): A POS

## 2015-09-15 LAB — CBC
HEMATOCRIT: 38.9 % (ref 36.0–46.0)
Hemoglobin: 13.3 g/dL (ref 12.0–15.0)
MCH: 31.6 pg (ref 26.0–34.0)
MCHC: 34.2 g/dL (ref 30.0–36.0)
MCV: 92.4 fL (ref 78.0–100.0)
PLATELETS: 152 10*3/uL (ref 150–400)
RBC: 4.21 MIL/uL (ref 3.87–5.11)
RDW: 13.6 % (ref 11.5–15.5)
WBC: 5.4 10*3/uL (ref 4.0–10.5)

## 2015-09-15 LAB — TYPE AND SCREEN
ABO/RH(D): A POS
Antibody Screen: NEGATIVE

## 2015-09-15 NOTE — Patient Instructions (Addendum)
Your procedure is scheduled on:09/27/15  Enter through the Main Entrance at :10:30 am  Pick up desk phone and dial [phone] and inform us of your arrival.  Please call [phone] if you have any problems the morning of surgery.  Remember: Do not eat food after midnight:Monday Clear liquids are ok until:8am on Tuesday   You may brush your teeth the morning of surgery.  Take these meds the morning of surgery with a sip of water:synthroid  DO NOT wear jewelry, eye make-up, lipstick,body lotion, or dark fingernail polish.  (Polished toes are ok) You may wear deodorant.  If you are to be admitted after surgery, leave suitcase in car until your room has been assigned. Patients discharged on the day of surgery will not be allowed to drive home. Wear loose fitting, comfortable clothes for your ride home.

## 2015-09-25 NOTE — H&P (Signed)
[name]  Location: [facility] Patient #: [id number] DOB: 1976/08/08 Married / Language: English / Race: White Female       History of Present Illness   Patient words: pre op PAC removal with hysterectomy 10-4.  The patient is a 39 year old female who presents with breast cancer. She returns for long-term follow-up and for a preop consultation regarding Port-A-Cath removal. Her husband is with her throughout the encounter She is 39. On September 28, 2014 she underwent right total mastectomy and sentinel node biopsy. She had multifocal cancer of the right breast, receptor positive, HER-2 negative. There were 2 separate tumors, 2.5 cm tumor and a 9 mm tumor. 2 of the 6 lymph nodes had microscopic metastasis. Stage T2, N1 (IIB) Genetic testing was negative. I placed a Port-A-Cath and she completed her chemotherapy and has now completed her radiation therapy. Dr. Jana Hakim has her on tamoxifen. She states that there is concern about uterine cancer. She states her estrogen levels are still high. They have decided to perform laparoscopic assisted prophylactic hysterectomy and bilateral oophorectomy. That is scheduled for next week. She wants the Port-A-Cath out and were going to do that at the same time Dr. Ouida Sills performed the hysterectomy. She considered reconstruction and talk to Dr. Migdalia Dk but decided against that. Perhaps later. On May 02, 2015 diagnostic left mammogram with 3-D and ultrasound left breast were performed. Breasts were dense, category C. Felt to be probably benign. Because the patient complained of left nipple retraction a six-month follow-up was recommended. I offered to perform breast exam today and she wanted to defer that until she was at the hospital next week. She is regular breast exams with Dr. Jana Hakim I discussed the indications, details, techniques, and numerous risk Port-A-Cath insertion. She is aware the risk of bleeding,  infection, nerve damage with chronic pain, air embolus, catheter embolus and other unforeseen problems. She understands these issues well. This time all of her questions were answered. She agrees with this plan.   Allergies  Penicillins Wheat Gluten --Celiac Disease  Medication History  Synthroid (88MCG Tablet, Oral) Active. Tamoxifen Citrate (20MG Tablet, Oral) Active.  Vitals   Weight: 127 lb Height: 61in Body Surface Area: 1.57 m Body Mass Index: 24 kg/m Temp.: 98.71F  Pulse: 64 (Regular)  BP: 102/64 (Sitting, Left Arm, Standard)    Physical Exam  General Note: Alert. Pleasant. Appears quite fit with good performance status. Husband is present.   Head and Neck Note: No adenopathy or mass   Chest and Lung Exam Note: Clear to auscultation bilaterally. Port palpable right infraclavicular area without wound problems   Breast Note: Breast exam deferred per patient request   Cardiovascular Note: Regular rate and rhythm. No murmur. No ectopy.   Neurologic Note: Alert and oriented 4. Gait normal. No gross motor or sensory deficits.     Assessment & Plan  CANCER OF CENTRAL PORTION OF RIGHT BREAST (C50.111) Current Plans  Follow up in 6 months or as needed You are scheduled for Port-A-Cath removal by me next week at the same time Dr. Ouida Sills performs your hysterectomy. We discussed the techniques and risks of this in detail Be sure to get mammograms in November, as scheduled Continue to take the tamoxifen Keep your appointments with Dr. Jana Hakim Return to see Dr. Dalbert Batman in 6 months, sooner if surgical issues arise.  FAMILY HISTORY OF BREAST CANCER (Z80.3)  HISTORY OF CHEMOTHERAPY (Z92.21)    Pau Banh [name], M.D., [facility]  General and Minimally invasive Surgery Breast and Colorectal Surgery Office:   [phone] Pager:   [phone]

## 2015-09-26 NOTE — Anesthesia Preprocedure Evaluation (Signed)
Anesthesia Evaluation  Patient identified by MRN, date of birth, ID band Patient awake    Reviewed: Allergy & Precautions, H&P , Patient's Chart, lab work & pertinent test results, reviewed documented beta blocker date and time   Airway Mallampati: II  TM Distance: >3 FB Neck ROM: full    Dental no notable dental hx.    Pulmonary    Pulmonary exam normal breath sounds clear to auscultation       Cardiovascular  Rhythm:regular Rate:Normal     Neuro/Psych    GI/Hepatic   Endo/Other  Hypothyroidism   Renal/GU      Musculoskeletal   Abdominal   Peds  Hematology   Anesthesia Other Findings   Reproductive/Obstetrics                             Anesthesia Physical Anesthesia Plan  ASA: II  Anesthesia Plan: General   Post-op Pain Management:    Induction: Intravenous  Airway Management Planned: Oral ETT  Additional Equipment:   Intra-op Plan:   Post-operative Plan: Extubation in OR  Informed Consent: I have reviewed the patients History and Physical, chart, labs and discussed the procedure including the risks, benefits and alternatives for the proposed anesthesia with the patient or authorized representative who has indicated his/her understanding and acceptance.   Dental Advisory Given and Dental advisory given  Plan Discussed with: CRNA and Surgeon  Anesthesia Plan Comments: (  Discussed general anesthesia, including possible nausea, instrumentation of airway, sore throat,pulmonary aspiration, etc. I asked if the were any outstanding questions, or  concerns before we proceeded. )        Anesthesia Quick Evaluation

## 2015-09-27 ENCOUNTER — Ambulatory Visit (HOSPITAL_COMMUNITY): Payer: BLUE CROSS/BLUE SHIELD | Admitting: Anesthesiology

## 2015-09-27 ENCOUNTER — Ambulatory Visit (HOSPITAL_COMMUNITY)
Admission: RE | Admit: 2015-09-27 | Discharge: 2015-09-28 | Disposition: A | Discharge status: Home / Self Care | Payer: BLUE CROSS/BLUE SHIELD | Source: Ambulatory Visit | Attending: Obstetrics and Gynecology | Admitting: Obstetrics and Gynecology

## 2015-09-27 ENCOUNTER — Encounter (HOSPITAL_COMMUNITY)
Admission: RE | Disposition: A | Discharge status: Home / Self Care | Payer: Self-pay | Source: Ambulatory Visit | Attending: Obstetrics and Gynecology

## 2015-09-27 ENCOUNTER — Encounter (HOSPITAL_COMMUNITY): Payer: Self-pay | Admitting: Emergency Medicine

## 2015-09-27 DIAGNOSIS — Z9011 Acquired absence of right breast and nipple: Secondary | ICD-10-CM | POA: Insufficient documentation

## 2015-09-27 DIAGNOSIS — Z880 Allergy status to penicillin: Secondary | ICD-10-CM | POA: Diagnosis not present

## 2015-09-27 DIAGNOSIS — N879 Dysplasia of cervix uteri, unspecified: Secondary | ICD-10-CM | POA: Diagnosis not present

## 2015-09-27 DIAGNOSIS — N939 Abnormal uterine and vaginal bleeding, unspecified: Secondary | ICD-10-CM | POA: Insufficient documentation

## 2015-09-27 DIAGNOSIS — N838 Other noninflammatory disorders of ovary, fallopian tube and broad ligament: Secondary | ICD-10-CM | POA: Diagnosis not present

## 2015-09-27 DIAGNOSIS — N8311 Corpus luteum cyst of right ovary: Secondary | ICD-10-CM | POA: Diagnosis not present

## 2015-09-27 DIAGNOSIS — N8302 Follicular cyst of left ovary: Secondary | ICD-10-CM | POA: Insufficient documentation

## 2015-09-27 DIAGNOSIS — N888 Other specified noninflammatory disorders of cervix uteri: Secondary | ICD-10-CM | POA: Diagnosis not present

## 2015-09-27 DIAGNOSIS — C50511 Malignant neoplasm of lower-outer quadrant of right female breast: Secondary | ICD-10-CM | POA: Diagnosis not present

## 2015-09-27 DIAGNOSIS — K900 Celiac disease: Secondary | ICD-10-CM | POA: Diagnosis not present

## 2015-09-27 DIAGNOSIS — C50111 Malignant neoplasm of central portion of right female breast: Secondary | ICD-10-CM | POA: Insufficient documentation

## 2015-09-27 DIAGNOSIS — Z79899 Other long term (current) drug therapy: Secondary | ICD-10-CM | POA: Diagnosis not present

## 2015-09-27 DIAGNOSIS — E039 Hypothyroidism, unspecified: Secondary | ICD-10-CM | POA: Insufficient documentation

## 2015-09-27 DIAGNOSIS — C50919 Malignant neoplasm of unspecified site of unspecified female breast: Secondary | ICD-10-CM | POA: Diagnosis present

## 2015-09-27 DIAGNOSIS — N8301 Follicular cyst of right ovary: Secondary | ICD-10-CM | POA: Insufficient documentation

## 2015-09-27 DIAGNOSIS — Z79810 Long term (current) use of selective estrogen receptor modulators (SERMs): Secondary | ICD-10-CM | POA: Diagnosis not present

## 2015-09-27 DIAGNOSIS — Z452 Encounter for adjustment and management of vascular access device: Secondary | ICD-10-CM | POA: Insufficient documentation

## 2015-09-27 DIAGNOSIS — Z9221 Personal history of antineoplastic chemotherapy: Secondary | ICD-10-CM | POA: Diagnosis not present

## 2015-09-27 DIAGNOSIS — Z803 Family history of malignant neoplasm of breast: Secondary | ICD-10-CM | POA: Diagnosis not present

## 2015-09-27 DIAGNOSIS — Z923 Personal history of irradiation: Secondary | ICD-10-CM | POA: Insufficient documentation

## 2015-09-27 DIAGNOSIS — Z170 Estrogen receptor positive status [ER+]: Secondary | ICD-10-CM | POA: Diagnosis not present

## 2015-09-27 HISTORY — PX: LAPAROSCOPIC ASSISTED VAGINAL HYSTERECTOMY: SHX5398

## 2015-09-27 HISTORY — PX: LAPAROSCOPIC BILATERAL SALPINGO OOPHERECTOMY: SHX5890

## 2015-09-27 HISTORY — PX: PORT-A-CATH REMOVAL: SHX5289

## 2015-09-27 SURGERY — HYSTERECTOMY, VAGINAL, LAPAROSCOPY-ASSISTED
Anesthesia: General | Site: Vagina

## 2015-09-27 MED ORDER — FENTANYL CITRATE (PF) 100 MCG/2ML IJ SOLN: INTRAMUSCULAR | Status: DC | PRN

## 2015-09-27 MED ORDER — PROPOFOL 10 MG/ML IV BOLUS
INTRAVENOUS | Status: AC
  Filled 2015-09-27: qty 20

## 2015-09-27 MED ORDER — SIMETHICONE 80 MG PO CHEW: 80.0000 mg | CHEWABLE_TABLET | Freq: Four times a day (QID) | ORAL | Status: DC | PRN

## 2015-09-27 MED ORDER — METOCLOPRAMIDE HCL 5 MG/ML IJ SOLN
INTRAMUSCULAR | Status: AC
  Filled 2015-09-27: qty 2

## 2015-09-27 MED ORDER — LIDOCAINE-EPINEPHRINE 1 %-1:100000 IJ SOLN
INTRAMUSCULAR | Status: AC
  Filled 2015-09-27: qty 1

## 2015-09-27 MED ORDER — HYDROMORPHONE HCL 1 MG/ML IJ SOLN
INTRAMUSCULAR | Status: AC
  Filled 2015-09-27: qty 1

## 2015-09-27 MED ORDER — LIDOCAINE HCL (CARDIAC) 20 MG/ML IV SOLN
INTRAVENOUS | Status: AC
  Filled 2015-09-27: qty 5

## 2015-09-27 MED ORDER — LACTATED RINGERS IR SOLN
Status: DC | PRN
  Administered 2015-09-27: 3000 mL

## 2015-09-27 MED ORDER — ONDANSETRON HCL 4 MG/2ML IJ SOLN: 4.0000 mg | Freq: Four times a day (QID) | INTRAMUSCULAR | Status: DC | PRN

## 2015-09-27 MED ORDER — BUPIVACAINE HCL (PF) 0.25 % IJ SOLN
INTRAMUSCULAR | Status: AC
  Filled 2015-09-27: qty 30

## 2015-09-27 MED ORDER — LIDOCAINE-EPINEPHRINE 1 %-1:100000 IJ SOLN
INTRAMUSCULAR | Status: DC | PRN
  Administered 2015-09-27: 9 mL

## 2015-09-27 MED ORDER — CEFAZOLIN SODIUM-DEXTROSE 2-3 GM-% IV SOLR
2.0000 g | INTRAVENOUS | Status: AC
  Administered 2015-09-27: 2 g via INTRAVENOUS

## 2015-09-27 MED ORDER — FENTANYL CITRATE (PF) 250 MCG/5ML IJ SOLN
INTRAMUSCULAR | Status: DC | PRN
  Administered 2015-09-27: 100 ug via INTRAVENOUS
  Administered 2015-09-27: 50 ug via INTRAVENOUS
  Administered 2015-09-27: 100 ug via INTRAVENOUS

## 2015-09-27 MED ORDER — ONDANSETRON HCL 4 MG/2ML IJ SOLN
INTRAMUSCULAR | Status: DC | PRN
  Administered 2015-09-27: 4 mg via INTRAVENOUS

## 2015-09-27 MED ORDER — ACETAMINOPHEN 160 MG/5ML PO SOLN
ORAL | Status: AC
  Administered 2015-09-27: 975 mg via ORAL
  Filled 2015-09-27: qty 40.6

## 2015-09-27 MED ORDER — IBUPROFEN 600 MG PO TABS: 600.0000 mg | ORAL_TABLET | Freq: Four times a day (QID) | ORAL | Status: DC | PRN

## 2015-09-27 MED ORDER — DIPHENHYDRAMINE HCL 50 MG/ML IJ SOLN
INTRAMUSCULAR | Status: DC | PRN
  Administered 2015-09-27: 12.5 mg via INTRAVENOUS

## 2015-09-27 MED ORDER — DEXAMETHASONE SODIUM PHOSPHATE 4 MG/ML IJ SOLN
INTRAMUSCULAR | Status: DC | PRN
Start: 2015-09-27 — End: 2015-09-27
  Administered 2015-09-27 (×2): 4 mg via INTRAVENOUS

## 2015-09-27 MED ORDER — LACTATED RINGERS IV SOLN
INTRAVENOUS | Status: DC
  Administered 2015-09-27 (×4): via INTRAVENOUS

## 2015-09-27 MED ORDER — HYDROMORPHONE HCL 1 MG/ML IJ SOLN
INTRAMUSCULAR | Status: DC | PRN
  Administered 2015-09-27: 0.5 mg via INTRAVENOUS
  Administered 2015-09-27: .25 mg via INTRAVENOUS
  Administered 2015-09-27: 0.5 mg via INTRAVENOUS

## 2015-09-27 MED ORDER — SCOPOLAMINE 1 MG/3DAYS TD PT72
MEDICATED_PATCH | TRANSDERMAL | Status: AC
  Filled 2015-09-27: qty 1

## 2015-09-27 MED ORDER — CHLORHEXIDINE GLUCONATE 4 % EX LIQD
1.0000 "application " | Freq: Once | CUTANEOUS | Status: DC
  Filled 2015-09-27: qty 15

## 2015-09-27 MED ORDER — NEOSTIGMINE METHYLSULFATE 10 MG/10ML IV SOLN
INTRAVENOUS | Status: DC | PRN
  Administered 2015-09-27: 1.5 mg via INTRAVENOUS

## 2015-09-27 MED ORDER — METHYLENE BLUE 1 % INJ SOLN
INTRAMUSCULAR | Status: AC
  Filled 2015-09-27: qty 1

## 2015-09-27 MED ORDER — PHENYLEPHRINE HCL 10 MG/ML IJ SOLN
INTRAMUSCULAR | Status: DC | PRN
  Administered 2015-09-27 (×2): 80 ug via INTRAVENOUS
  Administered 2015-09-27 (×3): 40 ug via INTRAVENOUS

## 2015-09-27 MED ORDER — CEFAZOLIN SODIUM-DEXTROSE 2-3 GM-% IV SOLR
INTRAVENOUS | Status: AC
  Filled 2015-09-27: qty 50

## 2015-09-27 MED ORDER — TAMOXIFEN CITRATE 10 MG PO TABS
20.0000 mg | ORAL_TABLET | Freq: Every day | ORAL | Status: DC
  Filled 2015-09-27: qty 2

## 2015-09-27 MED ORDER — BISACODYL 5 MG PO TBEC
5.0000 mg | DELAYED_RELEASE_TABLET | Freq: Every day | ORAL | Status: DC | PRN
Start: 2015-09-27 — End: 2015-09-28
  Filled 2015-09-27: qty 1

## 2015-09-27 MED ORDER — FENTANYL CITRATE (PF) 250 MCG/5ML IJ SOLN
INTRAMUSCULAR | Status: AC
Start: 2015-09-27 — End: 2015-09-27
  Filled 2015-09-27: qty 25

## 2015-09-27 MED ORDER — ONDANSETRON HCL 4 MG PO TABS: 4.0000 mg | ORAL_TABLET | Freq: Four times a day (QID) | ORAL | Status: DC | PRN

## 2015-09-27 MED ORDER — MIDAZOLAM HCL 2 MG/2ML IJ SOLN
INTRAMUSCULAR | Status: DC | PRN
  Administered 2015-09-27: 2 mg via INTRAVENOUS

## 2015-09-27 MED ORDER — CEFAZOLIN SODIUM-DEXTROSE 2-3 GM-% IV SOLR: 2.0000 g | INTRAVENOUS | Status: DC

## 2015-09-27 MED ORDER — METOCLOPRAMIDE HCL 5 MG/ML IJ SOLN
INTRAMUSCULAR | Status: DC | PRN
  Administered 2015-09-27: 10 mg via INTRAVENOUS

## 2015-09-27 MED ORDER — DOCUSATE SODIUM 50 MG PO CAPS
100.0000 mg | ORAL_CAPSULE | Freq: Two times a day (BID) | ORAL | Status: DC
  Administered 2015-09-27: 100 mg via ORAL

## 2015-09-27 MED ORDER — DEXAMETHASONE SODIUM PHOSPHATE 10 MG/ML IJ SOLN
INTRAMUSCULAR | Status: AC
Start: 2015-09-27 — End: 2015-09-27
  Filled 2015-09-27: qty 1

## 2015-09-27 MED ORDER — EPHEDRINE SULFATE 50 MG/ML IJ SOLN
INTRAMUSCULAR | Status: DC | PRN
  Administered 2015-09-27: 5 mg via INTRAVENOUS

## 2015-09-27 MED ORDER — HYDROMORPHONE HCL 1 MG/ML IJ SOLN
0.2000 mg | INTRAMUSCULAR | Status: DC | PRN
  Administered 2015-09-27: 0.2 mg via INTRAVENOUS
  Filled 2015-09-27: qty 1

## 2015-09-27 MED ORDER — OXYCODONE-ACETAMINOPHEN 5-325 MG PO TABS
1.0000 | ORAL_TABLET | ORAL | Status: DC | PRN
  Administered 2015-09-28 (×3): 1 via ORAL
  Filled 2015-09-27 (×3): qty 1

## 2015-09-27 MED ORDER — BUPIVACAINE HCL (PF) 0.25 % IJ SOLN
INTRAMUSCULAR | Status: DC | PRN
  Administered 2015-09-27: 4 mL

## 2015-09-27 MED ORDER — ACETAMINOPHEN 160 MG/5ML PO SOLN
975.0000 mg | Freq: Once | ORAL | Status: AC
  Administered 2015-09-27: 975 mg via ORAL

## 2015-09-27 MED ORDER — DIPHENHYDRAMINE HCL 50 MG/ML IJ SOLN
INTRAMUSCULAR | Status: AC
  Filled 2015-09-27: qty 1

## 2015-09-27 MED ORDER — NEOSTIGMINE METHYLSULFATE 10 MG/10ML IV SOLN
INTRAVENOUS | Status: AC
  Filled 2015-09-27: qty 1

## 2015-09-27 MED ORDER — GLYCOPYRROLATE 0.2 MG/ML IJ SOLN
INTRAMUSCULAR | Status: DC | PRN
  Administered 2015-09-27: 0.3 mg via INTRAVENOUS
  Administered 2015-09-27: .05 mg via INTRAVENOUS

## 2015-09-27 MED ORDER — PHENYLEPHRINE 40 MCG/ML (10ML) SYRINGE FOR IV PUSH (FOR BLOOD PRESSURE SUPPORT)
PREFILLED_SYRINGE | INTRAVENOUS | Status: AC
  Filled 2015-09-27: qty 10

## 2015-09-27 MED ORDER — GLYCOPYRROLATE 0.2 MG/ML IJ SOLN
INTRAMUSCULAR | Status: AC
  Filled 2015-09-27: qty 4

## 2015-09-27 MED ORDER — TAMOXIFEN CITRATE 10 MG PO TABS
20.0000 mg | ORAL_TABLET | Freq: Every day | ORAL | Status: DC
  Filled 2015-09-27 (×2): qty 2

## 2015-09-27 MED ORDER — DEXAMETHASONE SODIUM PHOSPHATE 4 MG/ML IJ SOLN
INTRAMUSCULAR | Status: AC
  Filled 2015-09-27: qty 1

## 2015-09-27 MED ORDER — ONDANSETRON HCL 4 MG/2ML IJ SOLN
INTRAMUSCULAR | Status: AC
  Filled 2015-09-27: qty 2

## 2015-09-27 MED ORDER — PROPOFOL 10 MG/ML IV BOLUS
INTRAVENOUS | Status: DC | PRN
  Administered 2015-09-27: 130 mg via INTRAVENOUS

## 2015-09-27 MED ORDER — DEXTROSE IN LACTATED RINGERS 5 % IV SOLN
INTRAVENOUS | Status: DC
  Administered 2015-09-27 – 2015-09-28 (×2): via INTRAVENOUS

## 2015-09-27 MED ORDER — ROCURONIUM BROMIDE 100 MG/10ML IV SOLN
INTRAVENOUS | Status: AC
  Filled 2015-09-27: qty 1

## 2015-09-27 MED ORDER — LEVOTHYROXINE SODIUM 88 MCG PO TABS
88.0000 ug | ORAL_TABLET | Freq: Every day | ORAL | Status: DC
  Filled 2015-09-27: qty 1

## 2015-09-27 MED ORDER — ROCURONIUM BROMIDE 100 MG/10ML IV SOLN
INTRAVENOUS | Status: DC | PRN
  Administered 2015-09-27: 40 mg via INTRAVENOUS

## 2015-09-27 MED ORDER — LIDOCAINE HCL (CARDIAC) 20 MG/ML IV SOLN
INTRAVENOUS | Status: DC | PRN
  Administered 2015-09-27: 100 mg via INTRAVENOUS

## 2015-09-27 MED ORDER — SCOPOLAMINE 1 MG/3DAYS TD PT72: 1.0000 | MEDICATED_PATCH | Freq: Once | TRANSDERMAL | Status: DC

## 2015-09-27 MED ORDER — EPHEDRINE 5 MG/ML INJ
INTRAVENOUS | Status: AC
  Filled 2015-09-27: qty 10

## 2015-09-27 MED ORDER — HYDROMORPHONE HCL 1 MG/ML IJ SOLN: 0.2500 mg | INTRAMUSCULAR | Status: DC | PRN

## 2015-09-27 MED ORDER — MIDAZOLAM HCL 2 MG/2ML IJ SOLN
INTRAMUSCULAR | Status: AC
  Filled 2015-09-27: qty 4

## 2015-09-27 SURGICAL SUPPLY — 39 items
CATH ROBINSON RED A/P 16FR (CATHETERS) ×5 IMPLANT
CLOSURE WOUND 1/4 X3 (GAUZE/BANDAGES/DRESSINGS)
CLOTH BEACON ORANGE TIMEOUT ST (SAFETY) ×5 IMPLANT
COVER BACK TABLE 60X90IN (DRAPES) ×5 IMPLANT
DECANTER SPIKE VIAL GLASS SM (MISCELLANEOUS) ×5 IMPLANT
DRSG COVADERM PLUS 2X2 (GAUZE/BANDAGES/DRESSINGS) ×10 IMPLANT
DRSG OPSITE POSTOP 3X4 (GAUZE/BANDAGES/DRESSINGS) ×5 IMPLANT
DURAPREP 26ML APPLICATOR (WOUND CARE) ×5 IMPLANT
ELECT REM PT RETURN 9FT ADLT (ELECTROSURGICAL) ×5
ELECTRODE REM PT RTRN 9FT ADLT (ELECTROSURGICAL) ×3 IMPLANT
EVACUATOR SMOKE 8.L (FILTER) ×5 IMPLANT
FORCEPS CUTTING 33CM 5MM (CUTTING FORCEPS) ×5 IMPLANT
GLOVE BIOGEL PI IND STRL 7.0 (GLOVE) ×6 IMPLANT
GLOVE BIOGEL PI INDICATOR 7.0 (GLOVE) ×4
GLOVE ECLIPSE 7.0 STRL STRAW (GLOVE) ×10 IMPLANT
LEGGING LITHOTOMY PAIR STRL (DRAPES) ×5 IMPLANT
LIQUID BAND (GAUZE/BANDAGES/DRESSINGS) ×5 IMPLANT
NS IRRIG 1000ML POUR BTL (IV SOLUTION) ×5 IMPLANT
PACK LAVH (CUSTOM PROCEDURE TRAY) ×5 IMPLANT
PACK ROBOTIC GOWN (GOWN DISPOSABLE) ×5 IMPLANT
PAD POSITIONING PINK XL (MISCELLANEOUS) ×5 IMPLANT
SCISSORS LAP 5X35 DISP (ENDOMECHANICALS) IMPLANT
SET IRRIG TUBING LAPAROSCOPIC (IRRIGATION / IRRIGATOR) ×5 IMPLANT
SOLUTION ELECTROLUBE (MISCELLANEOUS) ×5 IMPLANT
STRIP CLOSURE SKIN 1/4X3 (GAUZE/BANDAGES/DRESSINGS) IMPLANT
SUT MNCRL 0 MO-4 VIOLET 18 CR (SUTURE) ×6 IMPLANT
SUT MNCRL 0 VIOLET 6X18 (SUTURE) ×3 IMPLANT
SUT MONOCRYL 0 6X18 (SUTURE) ×2
SUT MONOCRYL 0 MO 4 18  CR/8 (SUTURE) ×4
SUT VIC AB 2-0 CT1 27 (SUTURE) ×6
SUT VIC AB 2-0 CT1 TAPERPNT 27 (SUTURE) ×9 IMPLANT
SUT VICRYL 0 UR6 27IN ABS (SUTURE) ×10 IMPLANT
SUT VICRYL RAPIDE 3 0 (SUTURE) ×5 IMPLANT
TOWEL OR 17X24 6PK STRL BLUE (TOWEL DISPOSABLE) ×10 IMPLANT
TRAY FOLEY BAG SILVER LF 16FR (SET/KITS/TRAYS/PACK) ×5 IMPLANT
TROCAR BALLN 12MMX100 BLUNT (TROCAR) ×5 IMPLANT
TROCAR XCEL NON-BLD 5MMX100MML (ENDOMECHANICALS) ×10 IMPLANT
WARMER LAPAROSCOPE (MISCELLANEOUS) ×5 IMPLANT
WATER STERILE IRR 1000ML POUR (IV SOLUTION) ×5 IMPLANT

## 2015-09-27 NOTE — Transfer of Care (Signed)
Immediate Anesthesia Transfer of Care Note  Patient: Misty [name]  Procedure(s) Performed: Procedure(s) with comments: LAPAROSCOPIC ASSISTED VAGINAL HYSTERECTOMY (N/A) LAPAROSCOPIC BILATERAL SALPINGO OOPHORECTOMY (Bilateral) REMOVAL PORT-A-CATH (N/A) - Porta Cath removed intact without defects  Patient Location: PACU  Anesthesia Type:General  Level of Consciousness: awake, alert  and oriented  Airway & Oxygen Therapy: Patient Spontanous Breathing and Patient connected to nasal cannula oxygen  Post-op Assessment: Report given to RN and Post -op Vital signs reviewed and stable  Post vital signs: Reviewed and stable  Last Vitals:  Filed Vitals:   09/27/15 0952  BP: 103/59  Pulse: 76  Temp: 36.8 C  Resp: 16    Complications: No apparent anesthesia complications

## 2015-09-27 NOTE — Interval H&P Note (Signed)
History and Physical Interval Note:  09/27/2015 10:30 AM  Misty [name]  has presented today for surgery, with the diagnosis of HISTORY OF BREAST CANCER ON TAMOXIFEN  The various methods of treatment have been discussed with the patient and family. After consideration of risks, benefits and other options for treatment, the patient has consented to  Procedure(s): LAPAROSCOPIC ASSISTED VAGINAL HYSTERECTOMY (N/A) LAPAROSCOPIC BILATERAL SALPINGO OOPHORECTOMY (Bilateral) REMOVAL PORT-A-CATH (N/A) as a surgical intervention .  The patient's history has been reviewed, patient examined, no change in status, stable for surgery.  I have reviewed the patient's chart and labs.  Questions were answered to the patient's satisfaction.     Adin Hector

## 2015-09-27 NOTE — Discharge Instructions (Signed)
° ° °  PORT-A-CATH REMOVAL : POST OP INSTRUCTIONS  Always review your discharge instruction sheet given to you by the facility where your surgery was performed.   1. A prescription for pain medication may be given to you upon discharge. Take your pain medication as prescribed, if needed. If narcotic pain medicine is not needed, then you make take acetaminophen (Tylenol) or ibuprofen (Advil) as needed.  2. Take your usually prescribed medications unless otherwise directed. 3. If you need a refill on your pain medication, please contact our office. All narcotic pain medicine now requires a paper prescription.  Phoned in and fax refills are no longer allowed by law.  Prescriptions will not be filled after 5 pm or on weekends.  4. You should follow a light diet for the remainder of the day after your procedure. 5. Most patients will experience some mild swelling and/or bruising in the area of the incision. It may take several days to resolve. 6. It is common to experience some constipation if taking pain medication after surgery. Increasing fluid intake and taking a stool softener (such as Colace) will usually help or prevent this problem from occurring. A mild laxative (Milk of Magnesia or Miralax) should be taken according to package directions if there are no bowel movements after 48 hours.  7. Unless discharge instructions indicate otherwise, you may remove your bandages 48 hours after surgery, and you may shower at that time. You may have steri-strips (small white skin tapes) in place directly over the incision.  These strips should be left on the skin for 7-10 days.  If your surgeon used Dermabond (skin glue) on the incision, you may shower in 24 hours.  The glue will flake off over the next 2-3 weeks.  8. the dressing can get wet and you may take a quick shower starting 48 hours from now..  9. ACTIVITIES:  Limit activity involving your arms for the next 72 hours. Do no strenuous exercise or activity  for 1 week. You may drive when you are no longer taking prescription pain medication, you can comfortably wear a seatbelt, and you can maneuver your car. 10.You may need to see your doctor in the office for a follow-up appointment.  See Dr. Dalbert Batman in December or January  Please       check with your doctor.  11.  Be sure to get the left breast mammogram in November as scheduled 11.When you receive a new Port-a-Cath, you will get a product guide and        ID card.  Please keep them in case you need them.  WHEN TO CALL YOUR DOCTOR [phone]): 1. Fever over 101.0 2. Chills 3. Continued bleeding from incision 4. Increased redness and tenderness at the site 5. Shortness of breath, difficulty breathing   The clinic staff is available to answer your questions during regular business hours. Please dont hesitate to call and ask to speak to one of the nurses or medical assistants for clinical concerns. If you have a medical emergency, go to the nearest emergency room or call 911.  A surgeon from [facility] is always on call at the hospital.     For further information, please visit [email or web address]

## 2015-09-27 NOTE — H&P (Signed)
Pt is a 39 y/o white female with a h.o. Breast cancer who presents to the OR for a LAVHBSO because she is ER/PR+  And bleeding on Tamoxifen. She will also have a port removed today by gen surg   Chief Complaint: HPI:  Past Medical History  Diagnosis Date  . Thyroid disease   . Celiac disease   . PONV (postoperative nausea and vomiting)   . Hypothyroidism   . Headache(784.0)   . Cancer [facility])     breast cancer  . Radiation 04/18/15-05/26/15    right breast  . Anxiety     situational    Past Surgical History  Procedure Laterality Date  . Cesarean section    . Gallbladder surgery    . Cholecystectomy    . Simple mastectomy with axillary sentinel node biopsy Right 09/28/2014    Procedure: RIGHT TOTAL MASTECTOMY, RIGHT  AXILLARY SENTINEL NODE BIOPSY;  Surgeon: Fanny Skates, MD;  Location: [place];  Service: General;  Laterality: Right;  . Portacath placement Right 10/22/2014    Procedure: INSERTION PORT-A-CATH;  Surgeon: Fanny Skates, MD;  Location: [place];  Service: General;  Laterality: Right;    Family History  Problem Relation Age of Onset  . Skin cancer Father   . Breast cancer Maternal Grandmother     dx unknown age  . Breast cancer Other 80    mat great aunt through [facility] with breast cancer   Social History:  reports that she has never smoked. She does not have any smokeless tobacco history on file. She reports that she does not drink alcohol or use illicit drugs.  Allergies:  Allergies  Allergen Reactions  . Gluten Meal Other (See Comments)    Has celiac's disease  . Penicillins Nausea And Vomiting    Has patient had a PCN reaction causing immediate rash, facial/tongue/throat swelling, SOB or lightheadedness with hypotension: No Has patient had a PCN reaction causing severe rash involving mucus membranes or skin necrosis: No Has patient had a PCN reaction that required hospitalization No Has patient had a PCN reaction occurring  within the last 10 years: No If all of the above answers are "NO", then may proceed with Cephalosporin use. Son has had a severe reaction to it    Medications Prior to Admission  Medication Sig Dispense Refill  . levothyroxine (SYNTHROID, LEVOTHROID) 88 MCG tablet Take 88 mcg by mouth daily before breakfast.    . tamoxifen (NOLVADEX) 20 MG tablet Take 1 tablet (20 mg total) by mouth daily. 90 tablet 4  . levothyroxine (SYNTHROID) 100 MCG tablet Take 1 tablet (100 mcg total) by mouth daily before breakfast. (Patient not taking: Reported on 09/12/2015) 30 tablet 6       Blood pressure 103/59, pulse 76, temperature 98.2 F (36.8 C), temperature source Oral, resp. rate 16, last menstrual period 09/21/2015, SpO2 100 %. General appearance: alert and cooperative Lungs: clear to auscultation bilaterally Heart: regular rate and rhythm, S1, S2 normal, no murmur, click, rub or gallop Abdomen: soft, non-tender; bowel sounds normal; no masses,  no organomegaly   Lab Results  Component Value Date   WBC 5.4 09/15/2015   HGB 13.3 09/15/2015   HCT 38.9 09/15/2015   MCV 92.4 09/15/2015   PLT 152 09/15/2015   No results found for: PREGTESTUR, PREGSERUM, HCG, HCGQUANT   Assessment/Plan Proceed with Ablation  Patient Active Problem List   Diagnosis Date Noted  . Inverted nipple 08/25/2015  . Hot flashes 08/25/2015  .  Edema of upper extremity 04/12/2015  . 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 Nausea 12/02/2014  . Anorexia 12/02/2014  . Weight loss 12/02/2014  . Constipation 12/02/2014  . Fatigue 12/02/2014  . Hypotension 12/02/2014  . Urinary frequency 12/02/2014  . Dehydration 12/01/2014  . Celiac disease 09/02/2014  . Family history of malignant neoplasm of breast 08/26/2014  . Breast cancer of lower-outer quadrant of right female breast ([place]) 08/25/2014  IMP/ H.O. Breast cancer Er/Pr + and bleeding on tamoxifen  Misty [name] 09/27/2015, 11:06 AM

## 2015-09-27 NOTE — Op Note (Signed)
Patient Name:           Misty [name]   Date of Surgery:        09/27/2015  Pre op Diagnosis:      Multifocal cancer right breast, estrogen receptor positive, HER-2 negative                                       Status post right total mastectomy and sentinel node biopsy for stage T2, N1 (IIB) cancer                                       Status post adjuvant chemotherapy  Post op Diagnosis:    Same  Procedure:                 Removal of Port-A-Cath  Surgeon:                     Edsel Petrin. Dalbert Batman, M.D., FACS  Assistant:                      OR staff  Operative Indications:   The patient is a 39 year old female who presents with breast cancer. She returns for long-term follow-up and for a preop consultation regarding Port-A-Cath removal. counter She is 39. On September 28, 2014 she underwent right total mastectomy and sentinel node biopsy. She had multifocal cancer of the right breast, receptor positive, HER-2 negative. There were 2 separate tumors, 2.5 cm tumor and a 9 mm tumor. 2 of the 6 lymph nodes had microscopic metastasis. Stage T2, N1 (IIB) Genetic testing was negative. I placed a Port-A-Cath and she completed her chemotherapy and has now completed her radiation therapy. Dr. Jana Hakim has her on tamoxifen. She states that there is concern about uterine cancer. She apparently has some vaginal bleeding.  She also  states her estrogen levels are still high. They have decided to perform laparoscopic assisted prophylactic hysterectomy and bilateral oophorectomy. Marland Kitchen She wants the Port-A-Cath out and were going to do that at the same time Dr. Ouida Sills performs the hysterectomy. On May 02, 2015 diagnostic left mammogram with 3-D and ultrasound left breast were performed. Breasts were dense, category C. Felt to be probably benign. Because the patient complained of left nipple retraction a six-month follow-up was recommended.  I discussed the indications, details, techniques, and  numerous risk Port-A-Cath insertion. She is aware the risk of bleeding, infection, nerve damage with chronic pain, air embolus, catheter embolus and other unforeseen problems. She understands these issues well. This time all of her questions were answered. She agrees with this plan.  Operative Findings:       The port and catheter were removed intact.  There was no bleeding or signs of infection  Procedure in Detail:          Following the induction of general anesthesia the patient was positioned supine with her arms at her sides appropriately padded.  The right upper chest and neck were prepped and draped in a sterile fashion.  Surgical timeout was performed.  1% Xylocaine with epinephrine was used as a local infiltration of septic.  Transverse incision was made in the right infraclavicular area through the old scar.  Dissection was carried down to the port and the capsule was incised.  I identified, cut, and removed all 3 proline sutures.  The port and catheter were then easily removed intact.  Subcutaneous tissue was closed with interrupted 3-0 Vicryl sutures and the skin closed with a running subcuticular 4-0 Monocryl and Dermabond.  Patient tolerated the procedure well.  EBL 5 mL.  No complications at this point in time.  At this point in the case Dr. Freda Munro came to the operating room and assumed control of the operative procedure.  His hysterectomy and BSO will be dictated separately.     Edsel Petrin. Dalbert Batman, M.D., FACS General and Minimally Invasive Surgery Breast and Colorectal Surgery  09/27/2015 11:44 AM

## 2015-09-27 NOTE — Anesthesia Procedure Notes (Signed)
Procedure Name: Intubation Date/Time: 09/27/2015 11:23 AM Performed by: Flossie Dibble Pre-anesthesia Checklist: Patient identified, Timeout performed, Emergency Drugs available, Patient being monitored and Suction available Patient Re-evaluated:Patient Re-evaluated prior to inductionOxygen Delivery Method: Circle system utilized Preoxygenation: Pre-oxygenation with 100% oxygen Intubation Type: IV induction Ventilation: Mask ventilation without difficulty Laryngoscope Size: Mac and 3 Grade View: Grade I Tube type: Oral Tube size: 7.0 mm Number of attempts: 1 Airway Equipment and Method: Stylet Placement Confirmation: ETT inserted through vocal cords under direct vision,  positive ETCO2 and breath sounds checked- equal and bilateral Secured at: 21 cm Tube secured with: Tape Dental Injury: Teeth and Oropharynx as per pre-operative assessment

## 2015-09-27 NOTE — Anesthesia Postprocedure Evaluation (Signed)
Anesthesia Post Note  Patient: Misty [name]  Procedure(s) Performed: Procedure(s) (LRB): LAPAROSCOPIC ASSISTED VAGINAL HYSTERECTOMY (N/A) LAPAROSCOPIC BILATERAL SALPINGO OOPHORECTOMY (Bilateral) REMOVAL PORT-A-CATH (N/A)  Anesthesia type: General  Patient location: PACU  Post pain: Pain level controlled  Post assessment: Post-op Vital signs reviewed  Last Vitals:  Filed Vitals:   09/27/15 1430  BP: 113/62  Pulse:   Temp:   Resp:     Post vital signs: Reviewed  Level of consciousness: sedated  Complications: No apparent anesthesia complications

## 2015-09-28 ENCOUNTER — Encounter (HOSPITAL_COMMUNITY): Payer: Self-pay | Admitting: Obstetrics and Gynecology

## 2015-09-28 DIAGNOSIS — C50111 Malignant neoplasm of central portion of right female breast: Secondary | ICD-10-CM | POA: Diagnosis not present

## 2015-09-28 LAB — CBC
HCT: 30.8 % — ABNORMAL LOW (ref 36.0–46.0)
Hemoglobin: 10.6 g/dL — ABNORMAL LOW (ref 12.0–15.0)
MCH: 32 pg (ref 26.0–34.0)
MCHC: 34.4 g/dL (ref 30.0–36.0)
MCV: 93.1 fL (ref 78.0–100.0)
PLATELETS: 122 10*3/uL — AB (ref 150–400)
RBC: 3.31 MIL/uL — AB (ref 3.87–5.11)
RDW: 13.8 % (ref 11.5–15.5)
WBC: 9.6 10*3/uL (ref 4.0–10.5)

## 2015-09-28 MED ORDER — OXYCODONE-ACETAMINOPHEN 5-325 MG PO TABS: 1.0000 | ORAL_TABLET | ORAL | Status: DC | PRN

## 2015-09-28 NOTE — Anesthesia Postprocedure Evaluation (Signed)
  Anesthesia Post-op Note  Patient: Misty [name]  Procedure(s) Performed: Procedure(s) with comments: LAPAROSCOPIC ASSISTED VAGINAL HYSTERECTOMY (N/A) LAPAROSCOPIC BILATERAL SALPINGO OOPHORECTOMY (Bilateral) REMOVAL PORT-A-CATH (N/A) - Porta Cath removed intact without defects  Patient Location: [facility]  Anesthesia Type:General  Level of Consciousness: awake  Airway and Oxygen Therapy: Patient Spontanous Breathing  Post-op Pain: mild  Post-op Assessment: Patient's Cardiovascular Status Stable and Respiratory Function Stable              Post-op Vital Signs: stable  Last Vitals:  Filed Vitals:   09/28/15 0516  BP: 97/57  Pulse: 72  Temp: 36.8 C  Resp: 18    Complications: No apparent anesthesia complications

## 2015-09-28 NOTE — Discharge Summary (Signed)
[name], [name] NO.:  [id number]  MEDICAL RECORD NO.:  [id number]  LOCATION:  9303                          FACILITY:  [facility]  PHYSICIAN:  Freda Munro, M.D.    DATE OF BIRTH:  1976/08/13  DATE OF ADMISSION:  09/27/2015 DATE OF DISCHARGE:  09/28/2015                              DISCHARGE SUMMARY   PRINCIPAL DISCHARGE DIAGNOSES: 1. History of ER/PR positive breast cancer. 2. Bleeding on tamoxifen.  PRINCIPAL PROCEDURES: 1. Laparoscopic-assisted vaginal hysterectomy with bilateral salpingo-     oophorectomy. 2. Removal of Port-A-Cath.  HISTORY OF PRESENT ILLNESS:  Ms. Misty [name] is a 39 year old, white female, G1, P1, who was admitted to [facility] for removal of a Port-A- Cath and a laparoscopic assisted vaginal hysterectomy with bilateral salpingo-oophorectomy  at the suggestion of her medical oncologist.  HOSPITAL COURSE:  The patient was admitted.  Dr. Fanny Skates initially took out the patient's Port-A-Cath without difficulty.  The patient then had a laparoscopic assisted vaginal hysterectomy with bilateral salpingo-oophorectomy.  A complete description of this procedure can be found in dictated operative note.  The patient's discharge course was uncomplicated.  On postop day #1, the patient was ambulating without difficulty.  She was eating a regular diet.  She had adequate pain control.  Her incision appeared to be healing well.  Her pathology was pending.  Her postop CBC was normal.  The patient was discharged to home with Percocet to take p.r.n.  She will follow up in the office in 2 weeks.  She will call the office with any fever, chills, problems with bowel function, bladder function, or vaginal bleeding.          ______________________________ Freda Munro, M.D.     MA/MEDQ  D:  09/28/2015  T:  09/28/2015  Job:  [id number]

## 2015-09-28 NOTE — Progress Notes (Signed)
Discharge instructions reviewed with patient.  Patient states understanding of home care, medications, activity, signs/symptoms to report to MD and return MD office visit.  Patients significant other and family will assist with her care @ home.  No home  equipment needed, patient has prescriptions and all personal belongings.  Patient discharged per wheelchair in stable condition with staff without incident.  

## 2015-09-28 NOTE — Progress Notes (Signed)
POD#1 Pt states that she is doing well. Tolerating diet. Ambulating. Min vaginal bleeding. Incisions healing well.  Abd soft, non distended VSSAF IMP/ Stable Plan/Will discharge to home.

## 2015-09-28 NOTE — Addendum Note (Signed)
Addendum  created 09/28/15 0758 by Ignacia Bayley, CRNA   Modules edited: Notes Section   Notes Section:  File: [id number]

## 2015-09-28 NOTE — Op Note (Signed)
NAMEBETZAYDA, Misty [name] NO.:  [id number]  MEDICAL RECORD NO.:  [id number]  LOCATION:  9303                          FACILITY:  [facility]  PHYSICIAN:  Freda Munro, M.D.    DATE OF BIRTH:  1976/09/09  DATE OF PROCEDURE:  09/27/2015 DATE OF DISCHARGE:                              OPERATIVE REPORT   PREOPERATIVE DIAGNOSES: 1. Breast cancer, ER/PR positive. 2. Bleeding, on tamoxifen.  POSTOPERATIVE DIAGNOSIS: 1. Breast cancer, ER/PR positive. 2. Bleeding, on tamoxifen.  PROCEDURE:  Laparoscopic-assisted vaginal hysterectomy with bilateral salpingo-oophorectomy.  SURGEON:  Freda Munro, M.D.  ASSISTANT:  Rogue Bussing.  ANTIBIOTICS:  Ancef 2 g.  DRAINS:  Foley bedside drainage.  ESTIMATED BLOOD LOSS:  250 mL.  COMPLICATIONS:  None.  FINDINGS:  The patient had normal fallopian tubes and ovaries bilaterally.  The appendix appeared normal.  There were a few adhesions on the anterior pelvis between the uterus and the anterior abdominal wall.  DESCRIPTION OF PROCEDURE:  The patient was taken to the operating room, where general anesthetic was administered without complications.  She was then prepped and draped.  The patient had a Port-A-Cath removed by Dr. Fanny Skates initially.  Once this was completed, the patient was re-draped and setup for the hysterectomy.  A Hulka tenaculum was placed in the anterior cervical lip.  Her bladder was drained with a red rubber catheter.  The umbilicus was injected with 0.25% Marcaine.  A vertical skin incision was made.  The fascia was grasped and entered.  The parietal peritoneum was entered bluntly.  A pursestring suture of 0 Vicryl was then placed.  A Hasson cannula was placed to the abdominal cavity and 3 L of carbon dioxide was insufflated.  The patient was then placed in Trendelenburg.  At this point, 5 mm ports were placed under direct visualization in the right and left lower quadrant.  The ureters were identified  bilaterally.  The infundibulopelvic ligament on the left was then cauterized and transected.  The round ligament was cauterized and transected.  The remaining broad ligament was cauterized and transected down to the level of the uterine artery.  A similar procedure was performed on the opposite side.  At this point, the procedure was included laparoscopically and the vaginal hysterectomy was begun.  The instruments were removed and the pneumoperitoneum released.  A weighted speculum was then placed in the vagina and 25 mL of 1% lidocaine with epinephrine was placed around the cervix, posterior cul-de-sac was then entered sharply.  The uterosacral ligaments were bilaterally clamped, cut, and ligated with 0 Monocryl suture.  The cervix was then circumscribed.  The bladder pillars were bilaterally clamped, cut, and ligated with 0 Monocryl suture.  The cardinal ligaments were serially clamped, cut, and ligated with 0 Monocryl suture.  The anterior cul-de- sac was entered.  The uterine arteries were then bilaterally clamped, cut, and ligated with 0 Monocryl suture and the uterus removed with fallopian tubes and ovaries intact.  At this point, the pedicles were all checked and felt to be hemostatic.  The posterior cuff was closed using 2-0 Vicryl in a running, locking fashion.  The vaginal cuff was then closed vertically using 2-0  Vicryl suture in a running, locking fashion.  Following this, the abdomen was re-insufflated and the patient was placed in Trendelenburg.  The pelvis was then irrigated.  The pedicles were all checked and felt to be hemostatic and the bladder was filled with 120 mL of saline with blue dye and no evidence of extravasation was noted.  The Foley catheter was then flipped again. Instruments and pneumoperitoneum were released.  The pursestring suture in the umbilicus was closed and then Dermabond was used to close the skin incisions.  The patient tolerated the procedure  well.  She was taken to recovery room in stable condition.  Instrument and lap count was correct x3.          ______________________________ Freda Munro, M.D.     MA/MEDQ  D:  09/27/2015  T:  09/27/2015  Job:  [id number]

## 2015-10-10 ENCOUNTER — Other Ambulatory Visit: Payer: Self-pay | Admitting: Oncology

## 2015-10-10 ENCOUNTER — Telehealth: Payer: Self-pay | Admitting: Oncology

## 2015-10-10 NOTE — Telephone Encounter (Signed)
Spoke with patient regarding labs on 12/1 and she drives so far so we made labs one hr apart    anne

## 2015-10-27 ENCOUNTER — Other Ambulatory Visit: Payer: Self-pay | Admitting: Oncology

## 2015-10-27 DIAGNOSIS — N6453 Retraction of nipple: Secondary | ICD-10-CM

## 2015-10-28 ENCOUNTER — Encounter: Payer: Self-pay | Admitting: Genetic Counselor

## 2015-10-28 DIAGNOSIS — Z1379 Encounter for other screening for genetic and chromosomal anomalies: Secondary | ICD-10-CM | POA: Insufficient documentation

## 2015-11-04 ENCOUNTER — Other Ambulatory Visit: Payer: Self-pay | Admitting: Oncology

## 2015-11-04 ENCOUNTER — Ambulatory Visit
Admission: RE | Admit: 2015-11-04 | Discharge: 2015-11-04 | Disposition: A | Discharge status: Home / Self Care | Payer: BLUE CROSS/BLUE SHIELD | Source: Ambulatory Visit | Attending: Oncology | Admitting: Oncology

## 2015-11-04 DIAGNOSIS — N6453 Retraction of nipple: Secondary | ICD-10-CM

## 2015-11-23 ENCOUNTER — Other Ambulatory Visit: Payer: Self-pay

## 2015-11-23 DIAGNOSIS — C50511 Malignant neoplasm of lower-outer quadrant of right female breast: Secondary | ICD-10-CM

## 2015-11-24 ENCOUNTER — Ambulatory Visit (HOSPITAL_BASED_OUTPATIENT_CLINIC_OR_DEPARTMENT_OTHER): Payer: BLUE CROSS/BLUE SHIELD | Admitting: Oncology

## 2015-11-24 ENCOUNTER — Other Ambulatory Visit: Payer: PRIVATE HEALTH INSURANCE

## 2015-11-24 ENCOUNTER — Telehealth: Payer: Self-pay | Admitting: Oncology

## 2015-11-24 ENCOUNTER — Other Ambulatory Visit (HOSPITAL_BASED_OUTPATIENT_CLINIC_OR_DEPARTMENT_OTHER): Payer: BLUE CROSS/BLUE SHIELD

## 2015-11-24 VITALS — BP 106/51 | HR 76 | Temp 98.0°F | Resp 18 | Ht 61.0 in | Wt 129.3 lb

## 2015-11-24 DIAGNOSIS — Z853 Personal history of malignant neoplasm of breast: Secondary | ICD-10-CM

## 2015-11-24 DIAGNOSIS — C50511 Malignant neoplasm of lower-outer quadrant of right female breast: Secondary | ICD-10-CM

## 2015-11-24 LAB — CBC WITH DIFFERENTIAL/PLATELET
BASO%: 0.8 % (ref 0.0–2.0)
BASOS ABS: 0 10*3/uL (ref 0.0–0.1)
EOS ABS: 0.2 10*3/uL (ref 0.0–0.5)
EOS%: 4.1 % (ref 0.0–7.0)
HCT: 38.4 % (ref 34.8–46.6)
HGB: 12.9 g/dL (ref 11.6–15.9)
LYMPH%: 16.8 % (ref 14.0–49.7)
MCH: 32 pg (ref 25.1–34.0)
MCHC: 33.6 g/dL (ref 31.5–36.0)
MCV: 95.3 fL (ref 79.5–101.0)
MONO#: 0.4 10*3/uL (ref 0.1–0.9)
MONO%: 6.6 % (ref 0.0–14.0)
NEUT#: 3.9 10*3/uL (ref 1.5–6.5)
NEUT%: 71.7 % (ref 38.4–76.8)
PLATELETS: 167 10*3/uL (ref 145–400)
RBC: 4.03 10*6/uL (ref 3.70–5.45)
RDW: 13.5 % (ref 11.2–14.5)
WBC: 5.4 10*3/uL (ref 3.9–10.3)
lymph#: 0.9 10*3/uL (ref 0.9–3.3)

## 2015-11-24 LAB — COMPREHENSIVE METABOLIC PANEL (CC13)
ALT: 10 U/L (ref 0–55)
ANION GAP: 4 meq/L (ref 3–11)
AST: 18 U/L (ref 5–34)
Albumin: 3.6 g/dL (ref 3.5–5.0)
Alkaline Phosphatase: 60 U/L (ref 40–150)
BILIRUBIN TOTAL: 0.48 mg/dL (ref 0.20–1.20)
BUN: 13.8 mg/dL (ref 7.0–26.0)
CALCIUM: 9.7 mg/dL (ref 8.4–10.4)
CO2: 29 meq/L (ref 22–29)
CREATININE: 0.7 mg/dL (ref 0.6–1.1)
Chloride: 107 mEq/L (ref 98–109)
EGFR: >90 mL/min/{1.73_m2} (ref >90)
Glucose: 72 mg/dl (ref 70–140)
Potassium: 4.2 mEq/L (ref 3.5–5.1)
Sodium: 140 mEq/L (ref 136–145)
TOTAL PROTEIN: 6.6 g/dL (ref 6.4–8.3)

## 2015-11-24 NOTE — Progress Notes (Signed)
Misty [name]  [phone] Fax:([phone]) [phone]     ID: Misty [name] DOB: Jun 25, 1976  MR#: [id number]  [name]#:[id number]  Patient Care Team: Glenda Chroman, MD as PCP - General (Internal Medicine) Fanny Skates, MD as Consulting Physician (General Surgery) Chauncey Cruel, MD as Consulting Physician (Oncology) PCP: Glenda Chroman., MD Freda Munro MD (GYN) Theodoro Kos M.D. (Plastics)  CHIEF COMPLAINT: Estrogen receptor positive breast cancer  CURRENT TREATMENT: Tamoxifen  BREAST CANCER HISTORY: From the original intake note:  Misty [name] was closely followed for some right breast changes between 20 11/12/2012. Everything seemed stable at that time. In 2014 she was promoted in her job, was working very hard, and actually missed having a mammogram. Late in January 2015 she noted some pain in her right breast and by February when she lifted her arms she noted that the nipple was tilting sideways and seemed a little bit of gathered. She didn't think much of this however. It was not until July that she noted a palpable mass in the lateral right breast. She brought this to her gynecologist's attention and she was set up for diagnostic mammography at [facility] 08/04/2014.   There was an area of distortion in the lateral aspect of the right breast noted on mammography,  corresponding to a palpable firm mass at the 8:00 position of the right breast. Ultrasound confirmed a 2.4 cm irregular hypoechoic spiculated mass in the right breast at the 8:00 position and in addition at the 5:00 position 1 cm from the nipple there was a taller than wide irregular hypoechoic mass measuring 0.8 cm. There was no right axillary adenopathy. In the left breast upper-outer quadrant there was a minimally complicated cyst measuring 0.7 cm.  On 08/11/2014 the patient underwent biopsy of the 2 right breast masses noted on ultrasound. Both showed invasive ductal carcinoma, grade 1, estrogen  receptor 90% positive, progesterone receptor 90% positive, both with strong staining intensity, HER-2 equivocal at 2+ but negative by FISH, with the signals ratio being 1.20 and the number per nucleus 2.1. The proliferation fraction by MIB-1 was 17% ([id number]; this report was reviewed by our pathologist, who concurred, [name] [phone]).  On 08/25/2014 the patient underwent bilateral breast MRI at [place]. This showed, in the right breast, an irregular enhancing mass at the 8:00 position measuring 2.7 cm. Also in the right breast at the 6:00 position there was another enhancing mass measuring 0.6 cm, located 2 cm away from the larger mass. There were also masses in the retroareolar position measuring 0.7 cm, in the posterior central right breast measuring 0.5 cm, and another one just inferior to the midline measuring 5.5 cm. There were no masses or findings of concern in the left breast and no abnormal appearing lymph nodes.  The patient's subsequent history is as detailed below  INTERVAL HISTORY: Misty [name] returns today for follow up of her  Estrogen receptor positivebreast cancer since her last visit here she underwent total abdominal hysterectomy with bilateral salpingo-oophorectomy, on 09/27/2015. The final pathology ([id number]) was benign. --she continues on tamoxifen, with good tolerance. She does have some night sweats, which keep her up some. She does not 12 take any medication to help with that. She still has a little bit of a vaginal discharge which she thinks may be related to the recent surgery. There has been no bleeding however. She obtains a tamoxifen at p no cost currently  REVIEW OF SYSTEMS: Misty [name]  Is not able to exercise regularly  because she literally has no time: She works all a time then drives her son 2 basketball and other sports. She denies unusual headaches, visual changes, nausea, or vomiting. There has been no dizziness or gait imbalance. There has been no cough, phlegm  production, or pleurisy. A detailed review of systems today was otherwise stable.  PAST MEDICAL HISTORY: Past Medical History  Diagnosis Date  . Thyroid disease   . Celiac disease   . PONV (postoperative nausea and vomiting)   . Hypothyroidism   . Headache(784.0)   . Cancer [facility])     breast cancer  . Radiation 04/18/15-05/26/15    right breast  . Anxiety     situational    PAST SURGICAL HISTORY: Past Surgical History  Procedure Laterality Date  . Cesarean section    . Gallbladder surgery    . Cholecystectomy    . Simple mastectomy with axillary sentinel node biopsy Right 09/28/2014    Procedure: RIGHT TOTAL MASTECTOMY, RIGHT  AXILLARY SENTINEL NODE BIOPSY;  Surgeon: Fanny Skates, MD;  Location: [place];  Service: General;  Laterality: Right;  . Portacath placement Right 10/22/2014    Procedure: INSERTION PORT-A-CATH;  Surgeon: Fanny Skates, MD;  Location: [place];  Service: General;  Laterality: Right;  . Laparoscopic assisted vaginal hysterectomy N/A 09/27/2015    Procedure: LAPAROSCOPIC ASSISTED VAGINAL HYSTERECTOMY;  Surgeon: Olga Millers, MD;  Location: [facility] [place] [facility];  Service: Gynecology;  Laterality: N/A;  . Laparoscopic bilateral salpingo oopherectomy Bilateral 09/27/2015    Procedure: LAPAROSCOPIC BILATERAL SALPINGO OOPHORECTOMY;  Surgeon: Olga Millers, MD;  Location: [place] [facility];  Service: Gynecology;  Laterality: Bilateral;  . Port-a-cath removal N/A 09/27/2015    Procedure: REMOVAL PORT-A-CATH;  Surgeon: Fanny Skates, MD;  Location: [place] [facility];  Service: General;  Laterality: N/A;  Porta Cath removed intact without defects    FAMILY HISTORY Family History  Problem Relation Age of Onset  . Skin cancer Father   . Breast cancer Maternal Grandmother     dx unknown age  . Breast cancer Other 58    mat great aunt through [facility] with breast cancer   the patient's parents are both living, in their late 73s. The patient had one brother, no sisters.  The patient's maternal grandmother was diagnosed with breast cancer in her late 49s. One of that grandmother's sisters was also diagnosed with breast cancer, in her 67s. There is no other history of breast or ovarian cancer in the family  GYNECOLOGIC HISTORY:  No LMP recorded. Patient is not currently having periods (Reason: Chemotherapy). Menarche age 28, first live birth age 17, the patient is GX P1. 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 Her husband, Misty [name], works as a Engineer, structural in [place]. Their son Misty [name] is 46. The patient attends a local [place]: Not in place   HEALTH MAINTENANCE: Social History  Substance Use Topics  . Smoking status: Never Smoker   . Smokeless tobacco: Not on file  . Alcohol Use: No     Colonoscopy:  PAP: 07/29/2014  Bone density:  Lipid panel:  Allergies  Allergen Reactions  . Gluten Meal Other (See Comments)    Has celiac's disease  . Penicillins Nausea And Vomiting  Has patient had a PCN reaction causing immediate rash, facial/tongue/throat swelling, SOB or lightheadedness with hypotension: No Has patient had a PCN reaction causing severe rash involving mucus membranes or skin necrosis: No Has patient had a PCN reaction that required hospitalization No Has patient had a PCN reaction occurring within the last 10 years: No If all of the above answers are "NO", then may proceed with Cephalosporin use. Son has had a severe reaction to it    Current Outpatient Prescriptions  Medication Sig Dispense Refill  . levothyroxine (SYNTHROID, LEVOTHROID) 88 MCG tablet Take 88 mcg by mouth daily before breakfast.    . oxyCODONE-acetaminophen (PERCOCET/ROXICET) 5-325  MG tablet Take 1-2 tablets by mouth every 4 (four) hours as needed (moderate to severe pain (when tolerating fluids)). 30 tablet 0  . tamoxifen (NOLVADEX) 20 MG tablet Take 1 tablet (20 mg total) by mouth daily. 90 tablet 4   No current facility-administered medications for this visit.    OBJECTIVE: Middle-aged white woman who appears  well Filed Vitals:   11/24/15 0953  BP: 106/51  Pulse: 76  Temp: 98 F (36.7 C)  Resp: 18     Body mass index is 24.44 kg/(m^2).    ECOG FS:0 - Asymptomatic  Sclerae unicteric, EOMs intact Oropharynx clear, dentition in good repair No cervical or supraclavicular adenopathy Lungs no rales or rhonchi Heart regular rate and rhythm Abd soft, nontender, positive bowel sounds MSK no focal spinal tenderness, no upper extremity lymphedema Neuro: nonfocal, well oriented, appropriate affect Breasts:  The right breast is status post lumpectomy and radiation. There is no evidence of local recurrence per the right axilla is benign. The left breast shows nipple inversion. This and there is no palpable mass. There is no erythema. The left axilla is benign.   LAB RESULTS:  CMP     Component Value Date/Time   NA 140 11/24/2015 0858   NA 142 09/23/2014 1456   K 4.2 11/24/2015 0858   K 3.9 09/23/2014 1456   CL 104 09/23/2014 1456   CO2 29 11/24/2015 0858   CO2 27 09/23/2014 1456   GLUCOSE 72 11/24/2015 0858   GLUCOSE 71 09/23/2014 1456   BUN 13.8 11/24/2015 0858   BUN 11 09/23/2014 1456   CREATININE 0.7 11/24/2015 0858   CREATININE 0.70 09/23/2014 1456   CALCIUM 9.7 11/24/2015 0858   CALCIUM 9.8 09/23/2014 1456   PROT 6.6 11/24/2015 0858   PROT 7.7 09/23/2014 1456   ALBUMIN 3.6 11/24/2015 0858   ALBUMIN 4.0 09/23/2014 1456   AST 18 11/24/2015 0858   AST 19 09/23/2014 1456   ALT 10 11/24/2015 0858   ALT 12 09/23/2014 1456   ALKPHOS 60 11/24/2015 0858   ALKPHOS 60 09/23/2014 1456   BILITOT 0.48 11/24/2015 0858   BILITOT 0.5 09/23/2014 1456    GFRNONAA >90 09/23/2014 1456   GFRAA >90 09/23/2014 1456    I No results found for: SPEP  Lab Results  Component Value Date   WBC 5.4 11/24/2015   NEUTROABS 3.9 11/24/2015   HGB 12.9 11/24/2015   HCT 38.4 11/24/2015   MCV 95.3 11/24/2015   PLT 167 11/24/2015      Chemistry      Component Value Date/Time   NA 140 11/24/2015 0858   NA 142 09/23/2014 1456   K 4.2 11/24/2015 0858   K 3.9 09/23/2014 1456   CL 104 09/23/2014 1456   CO2 29 11/24/2015 0858   CO2 27 09/23/2014 1456   BUN 13.8  11/24/2015 0858   BUN 11 09/23/2014 1456   CREATININE 0.7 11/24/2015 0858   CREATININE 0.70 09/23/2014 1456      Component Value Date/Time   CALCIUM 9.7 11/24/2015 0858   CALCIUM 9.8 09/23/2014 1456   ALKPHOS 60 11/24/2015 0858   ALKPHOS 60 09/23/2014 1456   AST 18 11/24/2015 0858   AST 19 09/23/2014 1456   ALT 10 11/24/2015 0858   ALT 12 09/23/2014 1456   BILITOT 0.48 11/24/2015 0858   BILITOT 0.5 09/23/2014 1456       No results found for: LABCA2  No components found for: LABCA125  No results for input(s): INR in the last 168 hours.  Urinalysis No results found for: COLORURINE  STUDIES: Mm Diag Breast Tomo Uni Left  11/04/2015  CLINICAL DATA:  Six-month follow-up for left breast nipple retraction. The patient has had a right breast mastectomy in 2015. Patient noticed a left nipple retraction following mastectomy. She feels this has been stable over the past 6 months. EXAM: DIGITAL DIAGNOSTIC LEFT MAMMOGRAM WITH 3D TOMOSYNTHESIS AND CAD COMPARISON:  Previous exam(s). ACR Breast Density Category c: The breast tissue is heterogeneously dense, which may obscure small masses. FINDINGS: No suspicious calcifications, masses or areas of distortion are seen in the left breast. The slight inversion of the left nipple appears stable as compared to prior. Mammographic images were processed with CAD. IMPRESSION: 1. No evidence of malignancy in the left breast. Stable slight inversion of  the left nipple. RECOMMENDATION: 1.  Continued clinical monitoring of the left breast is recommended. 2. Diagnostic mammogram of the left breast is recommended in 6 months for continued evaluation of the left nipple inversion. I have discussed the findings and recommendations with the patient. Results were also provided in writing at the conclusion of the visit. If applicable, a reminder letter will be sent to the patient regarding the next appointment. BI-RADS CATEGORY  2: Benign. Electronically Signed   By: Ammie Ferrier M.D.   On: 11/04/2015 10:59      ASSESSMENT: 38 y.o. BRCA negative [place] woman status post biopsy of 2 separate right breast masses 08/11/2014, both positive for an invasive ductal carcinoma, grade 1, estrogen and progesterone receptor strongly positive, with an MIB-1 of 19%, and HER-2 nonamplified  (1) genetics testing sent 08/26/2014, showing no mutations in the BRCA genes; there were also no mutations in ATM, BARD 1, BRIP1, [place] 1, CHE K2, MR E11A, M UT YH, N BN, NF1, PA L B2, PTE N, RAD 50, RAD 50 1C, RAD 50 1D, and T p53.  (2) status post right mastectomy with sentinel lymph node sampling 09/28/2014 for an mpT2 pN1, stage IIB invasive breast cancer (with both ductal and lobular features), grade 2, with negative margins  (3) adjuvant chemotherapy consisted of doxorubicin and cyclophosphamide in dose dense fashion 4, completed 12/30/2014, followed by paclitaxel weekly x 12 started 01/21/2015.  (a) paclitaxel stopped after 5 treatments due to persistent neuropathy, last dose 02/18/2015  (4) adjuvant radiation given 04/18/2015-05/26/2015: Site/dose:  Right chest wall/50.4 [place] Cables @ 1.8 Pearline Cables per fraction x 28 fractions Right supraclavicular fossa/PAB 45 Gy _0 .8 Gy per fraction x 25 fractions Right scar boost / 10 [place] at [employer] per fraction x 5 fractions  (5) started tamoxifen 07/07/2015  (a) s/p TAH-BSO 09/27/2015 with benign pathology  (6) history of celiac  disease  PLAN: Misty [name] is now little over a year out from her definitive surgery with no evidence of disease recurrence. This is very favorable.  She is tolerating the tamoxifen generally well. I did suggest that we could add gabapentin at bedtime for the nighttime hot flashes but she prefers to wait and see the hot flashes "burn out" over the next few months. This certainly can do that. For now accordingly we are just observing.   her insurance denied her most recent imaging. I asked her to discuss it with the radiologist group so they can help her with an appeal.    otherwise she is going to see me again in 3 months. She would like to have her vitamin D and TSH tested and I have added those orders to the standard orders for her.  She knows to call for any problems that may develop for that.  Marland KitchenChauncey Cruel, MD   11/24/2015 10:23 AM

## 2015-11-24 NOTE — Telephone Encounter (Signed)
Gave patient avs report and appointments for February.

## 2016-01-24 ENCOUNTER — Telehealth: Payer: Self-pay

## 2016-01-24 NOTE — Telephone Encounter (Signed)
Pt called stating she noticed blood in stool and wanted direction. She does have a GI doctor- Dr Collene Mares for celiac disease. Instructed her to go to him. Will inform Dr Jana Hakim.

## 2016-02-20 ENCOUNTER — Other Ambulatory Visit: Payer: Self-pay | Admitting: Oncology

## 2016-02-20 ENCOUNTER — Telehealth: Payer: Self-pay | Admitting: Oncology

## 2016-02-20 ENCOUNTER — Ambulatory Visit (HOSPITAL_BASED_OUTPATIENT_CLINIC_OR_DEPARTMENT_OTHER): Payer: BLUE CROSS/BLUE SHIELD | Admitting: Oncology

## 2016-02-20 ENCOUNTER — Other Ambulatory Visit (HOSPITAL_BASED_OUTPATIENT_CLINIC_OR_DEPARTMENT_OTHER): Payer: BLUE CROSS/BLUE SHIELD

## 2016-02-20 VITALS — BP 107/65 | HR 89 | Temp 98.1°F | Resp 18 | Ht 61.0 in | Wt 129.6 lb

## 2016-02-20 DIAGNOSIS — N6459 Other signs and symptoms in breast: Secondary | ICD-10-CM

## 2016-02-20 DIAGNOSIS — E079 Disorder of thyroid, unspecified: Secondary | ICD-10-CM

## 2016-02-20 DIAGNOSIS — C50511 Malignant neoplasm of lower-outer quadrant of right female breast: Secondary | ICD-10-CM

## 2016-02-20 DIAGNOSIS — K900 Celiac disease: Secondary | ICD-10-CM | POA: Diagnosis not present

## 2016-02-20 LAB — CBC WITH DIFFERENTIAL/PLATELET
BASO%: 0.6 % (ref 0.0–2.0)
Basophils Absolute: 0 10*3/uL (ref 0.0–0.1)
EOS ABS: 0.2 10*3/uL (ref 0.0–0.5)
EOS%: 3.1 % (ref 0.0–7.0)
HEMATOCRIT: 39.6 % (ref 34.8–46.6)
HGB: 13.2 g/dL (ref 11.6–15.9)
LYMPH#: 1.2 10*3/uL (ref 0.9–3.3)
LYMPH%: 21.8 % (ref 14.0–49.7)
MCH: 30.9 pg (ref 25.1–34.0)
MCHC: 33.4 g/dL (ref 31.5–36.0)
MCV: 92.7 fL (ref 79.5–101.0)
MONO#: 0.3 10*3/uL (ref 0.1–0.9)
MONO%: 5.5 % (ref 0.0–14.0)
NEUT%: 69 % (ref 38.4–76.8)
NEUTROS ABS: 4 10*3/uL (ref 1.5–6.5)
PLATELETS: 147 10*3/uL (ref 145–400)
RBC: 4.27 10*6/uL (ref 3.70–5.45)
RDW: 13.2 % (ref 11.2–14.5)
WBC: 5.7 10*3/uL (ref 3.9–10.3)

## 2016-02-20 LAB — COMPREHENSIVE METABOLIC PANEL
ALBUMIN: 4 g/dL (ref 3.5–5.0)
ALK PHOS: 71 U/L (ref 40–150)
ALT: 14 U/L (ref 0–55)
ANION GAP: 10 meq/L (ref 3–11)
AST: 20 U/L (ref 5–34)
BILIRUBIN TOTAL: 0.51 mg/dL (ref 0.20–1.20)
BUN: 14.4 mg/dL (ref 7.0–26.0)
CALCIUM: 9.6 mg/dL (ref 8.4–10.4)
CO2: 26 mEq/L (ref 22–29)
CREATININE: 0.8 mg/dL (ref 0.6–1.1)
Chloride: 105 mEq/L (ref 98–109)
Glucose: 109 mg/dl (ref 70–140)
Potassium: 4.3 mEq/L (ref 3.5–5.1)
Sodium: 141 mEq/L (ref 136–145)
TOTAL PROTEIN: 7 g/dL (ref 6.4–8.3)

## 2016-02-20 MED ORDER — TAMOXIFEN CITRATE 20 MG PO TABS: 20.0000 mg | ORAL_TABLET | Freq: Every day | ORAL | Status: DC

## 2016-02-20 NOTE — Telephone Encounter (Signed)
Gave patient avs report and appointments for mammo 5/15 at [facility] and lab/GM 6/5. Per patient f/u pushed to 6/5 due to holiday end of May.
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
## 2016-02-21 LAB — TSH: TSH: 1.099 m(IU)/L (ref 0.308–3.960)

## 2016-02-21 LAB — VITAMIN D 25 HYDROXY (VIT D DEFICIENCY, FRACTURES): VIT D 25 HYDROXY: 18.8 ng/mL — AB (ref 30.0–100.0)

## 2016-05-22 ENCOUNTER — Other Ambulatory Visit: Payer: BLUE CROSS/BLUE SHIELD

## 2016-05-22 ENCOUNTER — Ambulatory Visit: Payer: BLUE CROSS/BLUE SHIELD | Admitting: Oncology

## 2016-05-25 ENCOUNTER — Other Ambulatory Visit: Payer: Self-pay

## 2016-05-25 ENCOUNTER — Other Ambulatory Visit: Payer: Self-pay | Admitting: Oncology

## 2016-05-25 DIAGNOSIS — N6459 Other signs and symptoms in breast: Secondary | ICD-10-CM

## 2016-05-28 ENCOUNTER — Telehealth: Payer: Self-pay | Admitting: Oncology

## 2016-05-28 ENCOUNTER — Other Ambulatory Visit (HOSPITAL_BASED_OUTPATIENT_CLINIC_OR_DEPARTMENT_OTHER): Payer: BLUE CROSS/BLUE SHIELD

## 2016-05-28 ENCOUNTER — Other Ambulatory Visit: Payer: Self-pay | Admitting: *Deleted

## 2016-05-28 ENCOUNTER — Ambulatory Visit (HOSPITAL_BASED_OUTPATIENT_CLINIC_OR_DEPARTMENT_OTHER): Payer: BLUE CROSS/BLUE SHIELD | Admitting: Oncology

## 2016-05-28 ENCOUNTER — Ambulatory Visit
Admission: RE | Admit: 2016-05-28 | Discharge: 2016-05-28 | Disposition: A | Discharge status: Home / Self Care | Payer: BLUE CROSS/BLUE SHIELD | Source: Ambulatory Visit | Attending: Oncology | Admitting: Oncology

## 2016-05-28 ENCOUNTER — Telehealth: Payer: Self-pay

## 2016-05-28 VITALS — BP 97/66 | HR 77 | Temp 98.1°F | Resp 18 | Ht 61.0 in | Wt 120.8 lb

## 2016-05-28 DIAGNOSIS — Z853 Personal history of malignant neoplasm of breast: Secondary | ICD-10-CM

## 2016-05-28 DIAGNOSIS — Z79810 Long term (current) use of selective estrogen receptor modulators (SERMs): Secondary | ICD-10-CM

## 2016-05-28 DIAGNOSIS — N6459 Other signs and symptoms in breast: Secondary | ICD-10-CM

## 2016-05-28 DIAGNOSIS — C50511 Malignant neoplasm of lower-outer quadrant of right female breast: Secondary | ICD-10-CM

## 2016-05-28 LAB — COMPREHENSIVE METABOLIC PANEL
ALT: 15 U/L (ref 0–55)
AST: 21 U/L (ref 5–34)
Albumin: 4.2 g/dL (ref 3.5–5.0)
Alkaline Phosphatase: 78 U/L (ref 40–150)
Anion Gap: 8 mEq/L (ref 3–11)
BUN: 17.3 mg/dL (ref 7.0–26.0)
CALCIUM: 9.9 mg/dL (ref 8.4–10.4)
CHLORIDE: 105 meq/L (ref 98–109)
CO2: 29 meq/L (ref 22–29)
CREATININE: 0.8 mg/dL (ref 0.6–1.1)
EGFR: >90 mL/min/{1.73_m2} (ref >90)
GLUCOSE: 97 mg/dL (ref 70–140)
Potassium: 4.7 mEq/L (ref 3.5–5.1)
SODIUM: 141 meq/L (ref 136–145)
Total Bilirubin: 0.58 mg/dL (ref 0.20–1.20)
Total Protein: 7.1 g/dL (ref 6.4–8.3)

## 2016-05-28 LAB — CBC WITH DIFFERENTIAL/PLATELET
BASO%: 0.3 % (ref 0.0–2.0)
Basophils Absolute: 0 10*3/uL (ref 0.0–0.1)
EOS%: 3.5 % (ref 0.0–7.0)
Eosinophils Absolute: 0.2 10*3/uL (ref 0.0–0.5)
HEMATOCRIT: 38.5 % (ref 34.8–46.6)
HGB: 13.4 g/dL (ref 11.6–15.9)
LYMPH#: 1 10*3/uL (ref 0.9–3.3)
LYMPH%: 17.4 % (ref 14.0–49.7)
MCH: 32.1 pg (ref 25.1–34.0)
MCHC: 34.8 g/dL (ref 31.5–36.0)
MCV: 92.1 fL (ref 79.5–101.0)
MONO#: 0.3 10*3/uL (ref 0.1–0.9)
MONO%: 4.6 % (ref 0.0–14.0)
NEUT#: 4.4 10*3/uL (ref 1.5–6.5)
NEUT%: 74.2 % (ref 38.4–76.8)
Platelets: 164 10*3/uL (ref 145–400)
RBC: 4.18 10*6/uL (ref 3.70–5.45)
RDW: 13.4 % (ref 11.2–14.5)
WBC: 5.9 10*3/uL (ref 3.9–10.3)

## 2016-05-28 MED ORDER — VENLAFAXINE HCL ER 37.5 MG PO CP24: 37.5000 mg | ORAL_CAPSULE | Freq: Every day | ORAL | Status: DC

## 2016-05-28 MED ORDER — METOPROLOL TARTRATE 25 MG PO TABS: 25.0000 mg | ORAL_TABLET | Freq: Two times a day (BID) | ORAL | Status: DC

## 2016-05-28 NOTE — Progress Notes (Signed)
[place]  Telephone:([phone]) [phone] Fax:(336) [phone]     ID: Misty [name] DOB: December 04, 1976  MR#: [id number]  FYB#:[id number]  Patient Care Team: Glenda Chroman, MD as PCP - General (Internal Medicine) Fanny Skates, MD as Consulting Physician (General Surgery) Chauncey Cruel, MD as Consulting Physician (Oncology) PCP: Glenda Chroman, MD Freda Munro MD (GYN) Theodoro Kos M.D. (Plastics)  CHIEF COMPLAINT: Estrogen receptor positive breast cancer  CURRENT TREATMENT: Tamoxifen  BREAST CANCER HISTORY: From the original intake note:  Misty [name] was closely followed for some right breast changes between 20 11/12/2012. Everything seemed stable at that time. In 2014 she was promoted in her job, was working very hard, and actually missed having a mammogram. Late in January 2015 she noted some pain in her right breast and by February when she lifted her arms she noted that the nipple was tilting sideways and seemed a little bit of gathered. She didn't think much of this however. It was not until July that she noted a palpable mass in the lateral right breast. She brought this to her gynecologist's attention and she was set up for diagnostic mammography at [facility] 08/04/2014.   There was an area of distortion in the lateral aspect of the right breast noted on mammography,  corresponding to a palpable firm mass at the 8:00 position of the right breast. Ultrasound confirmed a 2.4 cm irregular hypoechoic spiculated mass in the right breast at the 8:00 position and in addition at the 5:00 position 1 cm from the nipple there was a taller than wide irregular hypoechoic mass measuring 0.8 cm. There was no right axillary adenopathy. In the left breast upper-outer quadrant there was a minimally complicated cyst measuring 0.7 cm.  On 08/11/2014 the patient underwent biopsy of the 2 right breast masses noted on ultrasound. Both showed invasive ductal carcinoma, grade 1, estrogen receptor  90% positive, progesterone receptor 90% positive, both with strong staining intensity, HER-2 equivocal at 2+ but negative by FISH, with the signals ratio being 1.20 and the number per nucleus 2.1. The proliferation fraction by MIB-1 was 17% ([id number]; this report was reviewed by our pathologist, who concurred, [name] [phone]).  On 08/25/2014 the patient underwent bilateral breast MRI at [facility]. This showed, in the right breast, an irregular enhancing mass at the 8:00 position measuring 2.7 cm. Also in the right breast at the 6:00 position there was another enhancing mass measuring 0.6 cm, located 2 cm away from the larger mass. There were also masses in the retroareolar position measuring 0.7 cm, in the posterior central right breast measuring 0.5 cm, and another one just inferior to the midline measuring 5.5 cm. There were no masses or findings of concern in the left breast and no abnormal appearing lymph nodes.  The patient's subsequent history is as detailed below  INTERVAL HISTORY: Misty [name] returns today for follow up of her estrogen receptor positive breast cancer accompanied by her husband Misty [name]. She tolerates tamoxifen generally well. We are planning to continue that through July 2018 and then consider switching to an aromatase inhibitor.  REVIEW OF SYSTEMS: Misty [name] remains extremely anxious and stressed. Her left breast hurts. The nipple in the left breast is slightly tilted and slightly sunken. She is very worried that they will be breast cancer in that breast. She just had studies today which came back fine but she still is anxious. This doesn't let her think work. 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 She's also developed some panic attacks. She was in church and her heart just started beating really loud and fast and she thought she was going to pass out. She has occasional headaches, which are not a new problem for her  and which she treats with Tylenol. Otherwise a detailed review of systems today was noncontributory   PAST MEDICAL HISTORY: Past Medical History  Diagnosis Date  . Thyroid disease   . Celiac disease   . PONV (postoperative nausea and vomiting)   . Hypothyroidism   . Headache(784.0)   . Cancer [facility])     breast cancer  . Radiation 04/18/15-05/26/15    right breast  . Anxiety     situational    PAST SURGICAL HISTORY: Past Surgical History  Procedure Laterality Date  . Cesarean section    . Gallbladder surgery    . Cholecystectomy    . Simple mastectomy with axillary sentinel node biopsy Right 09/28/2014    Procedure: RIGHT TOTAL MASTECTOMY, RIGHT  AXILLARY SENTINEL NODE BIOPSY;  Surgeon: Fanny Skates, MD;  Location: [place];  Service: General;  Laterality: Right;  . Portacath placement Right 10/22/2014    Procedure: INSERTION PORT-A-CATH;  Surgeon: Fanny Skates, MD;  Location: [place];  Service: General;  Laterality: Right;  . Laparoscopic assisted vaginal hysterectomy N/A 09/27/2015    Procedure: LAPAROSCOPIC ASSISTED VAGINAL HYSTERECTOMY;  Surgeon: Olga Millers, MD;  Location: [facility];  Service: Gynecology;  Laterality: N/A;  . Laparoscopic bilateral salpingo oopherectomy Bilateral 09/27/2015    Procedure: LAPAROSCOPIC BILATERAL SALPINGO OOPHORECTOMY;  Surgeon: Olga Millers, MD;  Location: [facility];  Service: Gynecology;  Laterality: Bilateral;  . Port-a-cath removal N/A 09/27/2015    Procedure: REMOVAL PORT-A-CATH;  Surgeon: Fanny Skates, MD;  Location: [facility];  Service: General;  Laterality: N/A;  Porta Cath removed intact without defects    FAMILY HISTORY Family History  Problem Relation Age of Onset  . Skin cancer Father   . Breast cancer Maternal Grandmother     dx unknown age  . Breast cancer Other 13    mat great aunt through [facility] with breast cancer   the patient's parents are both living, in their late 7s. The patient had one  brother, no sisters. The patient's maternal grandmother was diagnosed with breast cancer in her late 37s. One of that grandmother's sisters was also diagnosed with breast cancer, in her 86s. There is no other history of breast or ovarian cancer in the family  GYNECOLOGIC HISTORY:  No LMP recorded. Patient is not currently having periods (Reason: Chemotherapy). Menarche age 33, first live birth age 50, the patient is GX P1. 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 Her husband, Herbie [place] "Misty [name]" Lacey Jensen [name], works as a Engineer, structural in [place]. Their son Misty [name] is 25. The patient attends a local [employer]: Not in place   HEALTH MAINTENANCE: Social History  Substance Use Topics  . Smoking status: Never Smoker   . Smokeless tobacco: Not on file  . Alcohol Use: No     Colonoscopy:  PAP: 07/29/2014  Bone density:  Lipid panel:  Allergies  Allergen Reactions  . Gluten Meal Other (See Comments)    Has celiac's  disease  . Penicillins Nausea And Vomiting    Has patient had a PCN reaction causing immediate rash, facial/tongue/throat swelling, SOB or lightheadedness with hypotension: No Has patient had a PCN reaction causing severe rash involving mucus membranes or skin necrosis: No Has patient had a PCN reaction that required hospitalization No Has patient had a PCN reaction occurring within the last 10 years: No If all of the above answers are "NO", then may proceed with Cephalosporin use. Son has had a severe reaction to it    Current Outpatient Prescriptions  Medication Sig Dispense Refill  . levothyroxine (SYNTHROID, LEVOTHROID) 88 MCG tablet Take 88 mcg by mouth daily before breakfast.    . tamoxifen (NOLVADEX) 20 MG  tablet Take 1 tablet (20 mg total) by mouth daily. 90 tablet 4   No current facility-administered medications for this visit.    OBJECTIVE: Middle-aged white woman Who appears stated age 1 Vitals:   05/28/16 1353  BP: 97/66  Pulse: 77  Temp: 98.1 F (36.7 C)  Resp: 18     Body mass index is 22.84 kg/(m^2).    ECOG FS:1 - Symptomatic but completely ambulatory  Sclerae unicteric, EOMs intact Oropharynx clear, dentition in good repair No cervical or supraclavicular adenopathy Lungs no rales or rhonchi Heart regular rate and rhythm Abd soft, nontender, positive bowel sounds MSK no focal spinal tenderness, no upper extremity lymphedema Neuro: nonfocal, well oriented, appropriate affect Breasts: The right breast is status post mastectomy and radiation. There is no evidence of local recurrence. The right axilla is benign. The left breast the nipple is minimally inverted and slightly tilted to the left. There are no palpable masses. The left axilla is benign.  LAB RESULTS:  CMP     Component Value Date/Time   NA 141 02/20/2016 1436   NA 142 09/23/2014 1456   K 4.3 02/20/2016 1436   K 3.9 09/23/2014 1456   CL 104 09/23/2014 1456   CO2 26 02/20/2016 1436   CO2 27 09/23/2014 1456   GLUCOSE 109 02/20/2016 1436   GLUCOSE 71 09/23/2014 1456   BUN 14.4 02/20/2016 1436   BUN 11 09/23/2014 1456   CREATININE 0.8 02/20/2016 1436   CREATININE 0.70 09/23/2014 1456   CALCIUM 9.6 02/20/2016 1436   CALCIUM 9.8 09/23/2014 1456   PROT 7.0 02/20/2016 1436   PROT 7.7 09/23/2014 1456   ALBUMIN 4.0 02/20/2016 1436   ALBUMIN 4.0 09/23/2014 1456   AST 20 02/20/2016 1436   AST 19 09/23/2014 1456   ALT 14 02/20/2016 1436   ALT 12 09/23/2014 1456   ALKPHOS 71 02/20/2016 1436   ALKPHOS 60 09/23/2014 1456   BILITOT 0.51 02/20/2016 1436   BILITOT 0.5 09/23/2014 1456   GFRNONAA >90 09/23/2014 1456   GFRAA >90 09/23/2014 1456    I No results found for: SPEP  Lab Results  Component Value  Date   WBC 5.9 05/28/2016   NEUTROABS 4.4 05/28/2016   HGB 13.4 05/28/2016   HCT 38.5 05/28/2016   MCV 92.1 05/28/2016   PLT 164 05/28/2016      Chemistry      Component Value Date/Time   NA 141 02/20/2016 1436   NA 142 09/23/2014 1456   K 4.3 02/20/2016 1436   K 3.9 09/23/2014 1456   CL 104 09/23/2014 1456   CO2 26 02/20/2016 1436   CO2 27 09/23/2014 1456   BUN 14.4 02/20/2016 1436   BUN 11 09/23/2014 1456   CREATININE 0.8 02/20/2016 1436  CREATININE 0.70 09/23/2014 1456      Component Value Date/Time   CALCIUM 9.6 02/20/2016 1436   CALCIUM 9.8 09/23/2014 1456   ALKPHOS 71 02/20/2016 1436   ALKPHOS 60 09/23/2014 1456   AST 20 02/20/2016 1436   AST 19 09/23/2014 1456   ALT 14 02/20/2016 1436   ALT 12 09/23/2014 1456   BILITOT 0.51 02/20/2016 1436   BILITOT 0.5 09/23/2014 1456       No results found for: LABCA2  No components found for: LABCA125  No results for input(s): INR in the last 168 hours.  Urinalysis No results found for: COLORURINE  STUDIES: US Breast Ltd Uni Left Inc Axilla  05/28/2016  CLINICAL DATA:  The patient has a history of a previous right breast malignant mastectomy in October 2015 for estrogen receptor positive multicentric invasive mammary carcinoma with 1 of 3 sentinel lymph nodes positive. The patient underwent adjuvant chemotherapy and post mastectomy radiation therapy. The patient noted pain within the subareolar and inferior left breast and noted possible left nipple retraction. Diagnostic evaluation of the left breast demonstrated no worrisome findings. Short-term follow-up evaluation requested. EXAM: 2D DIGITAL DIAGNOSTIC LEFT MAMMOGRAM WITH CAD AND ADJUNCT TOMO ULTRASOUND LEFT BREAST COMPARISON:  11/04/2015, 05/02/2015. ACR Breast Density Category b: There are scattered areas of fibroglandular density. FINDINGS: On today's evaluation there is no subareolar left breast mass or nipple retraction. The breast parenchymal pattern is stable.  Mammographic images were processed with CAD. On physical exam, there is no nipple retraction on today's evaluation. There is no palpable subareolar left breast mass or nipple discharge. Targeted ultrasound is performed, showing normal appearing fibroglandular tissue within the subareolar left breast and inferior left breast. There is no mass, distortion, or worrisome shadowing. IMPRESSION: No findings worrisome for malignancy. Recommend follow-up left breast diagnostic mammogram in 6 months with expectation to return to yearly mammography thereafter. RECOMMENDATION: Left breast diagnostic mammogram in 6 months. I have discussed the findings and recommendations with the patient. Results were also provided in writing at the conclusion of the visit. If applicable, a reminder letter will be sent to the patient regarding the next appointment. BI-RADS CATEGORY  3: Probably benign. Electronically Signed   By: Altamese Cabal M.D.   On: 05/28/2016 11:35   Mm Diag Breast Tomo Uni Left  05/28/2016  CLINICAL DATA:  The patient has a history of a previous right breast malignant mastectomy in October 2015 for estrogen receptor positive multicentric invasive mammary carcinoma with 1 of 3 sentinel lymph nodes positive. The patient underwent adjuvant chemotherapy and post mastectomy radiation therapy. The patient noted pain within the subareolar and inferior left breast and noted possible left nipple retraction. Diagnostic evaluation of the left breast demonstrated no worrisome findings. Short-term follow-up evaluation requested. EXAM: 2D DIGITAL DIAGNOSTIC LEFT MAMMOGRAM WITH CAD AND ADJUNCT TOMO ULTRASOUND LEFT BREAST COMPARISON:  11/04/2015, 05/02/2015. ACR Breast Density Category b: There are scattered areas of fibroglandular density. FINDINGS: On today's evaluation there is no subareolar left breast mass or nipple retraction. The breast parenchymal pattern is stable. Mammographic images were processed with CAD. On physical  exam, there is no nipple retraction on today's evaluation. There is no palpable subareolar left breast mass or nipple discharge. Targeted ultrasound is performed, showing normal appearing fibroglandular tissue within the subareolar left breast and inferior left breast. There is no mass, distortion, or worrisome shadowing. IMPRESSION: No findings worrisome for malignancy. Recommend follow-up left breast diagnostic mammogram in 6 months with expectation to return to yearly mammography  thereafter. RECOMMENDATION: Left breast diagnostic mammogram in 6 months. I have discussed the findings and recommendations with the patient. Results were also provided in writing at the conclusion of the visit. If applicable, a reminder letter will be sent to the patient regarding the next appointment. BI-RADS CATEGORY  3: Probably benign. Electronically Signed   By: Altamese Cabal M.D.   On: 05/28/2016 11:35      ASSESSMENT: 40 y.o. BRCA negative [place], [place] woman status post biopsy of 2 separate right breast masses 08/11/2014, both positive for an invasive ductal carcinoma, grade 1, estrogen and progesterone receptor strongly positive, with an MIB-1 of 19%, and HER-2 nonamplified  (1) genetics testing sent 08/26/2014, showing no mutations in the BRCA genes; there were also no mutations in ATM, BARD 1, BRIP1, [place] 1, CHE K2, MR E11A, M UT YH, N BN, NF1, PA L B2, PTE N, RAD 50, RAD 50 1C, RAD 50 1D, and T p53.  (2) status post right mastectomy with sentinel lymph node sampling 09/28/2014 for an mpT2 pN1, stage IIB invasive breast cancer (with both ductal and lobular features), grade 2, with negative margins  (3) adjuvant chemotherapy consisted of doxorubicin and cyclophosphamide in dose dense fashion 4, completed 12/30/2014, followed by paclitaxel weekly x 12 started 01/21/2015.  (a) paclitaxel stopped after 5 treatments due to persistent neuropathy, last dose 02/18/2015  (4) adjuvant radiation given  04/18/2015-05/26/2015: Right chest wall/50.4 [place] Cables @ 1.8 [place] Cables per fraction x 28 fractions Right supraclavicular fossa/PAB 45 Gy @1 .8 Gy per fraction x 25 fractions Right scar boost / 10 [place] [employer] per fraction x 5 fractions  (5) started tamoxifen 07/07/2015  (a) s/p TAH-BSO 09/27/2015 with benign pathology  (6) history of celiac disease  PLAN: Vella is now 1-1/2 years out from definitive surgery for her breast cancer, with no evidence of disease recurrence. This is very favorable.  She is tolerating tamoxifen well, and the plan is to continue that an additional year or so before considering switching to an aromatase inhibitor.  She remains very anxious regarding her left breast. The nipple is indeed slightly tilted as she has noted. She has pain in that breast as she did in the right breast before she was found to have cancer there. Even though she just had mammography and ultrasonography of the left breast which found no abnormality she remains very anxious. She is considering mastectomy.  In addition she is unable to function at work. This is partly anxiety and stress and partly posttraumatic stress syndrome. This is not uncommon in our patients. She is considering disability.  Accordingly I am referring her to neuropsychiatry for further evaluation.  I also suggested she consider a beta blocker for her panic attacks and low-dose venlafaxine for distress. I put in both prescriptions but she tells me she is not going to be filling them out. She thinks the medication may make her "more crazy" instead of less.  She will have a repeat mammogram and ultrasound in 6 months and I will see her a couple of days after that. I will be glad to help her fill out disability papers if she does decide to applied  She will call with any other problems that may develop before her next visit here. Marland KitchenChauncey Cruel, MD   05/28/2016 2:03 PM

## 2016-05-28 NOTE — Telephone Encounter (Signed)
appt made and avs printed °

## 2016-05-28 NOTE — Telephone Encounter (Signed)
Gena from [facility] called to confirm rx. Dr Jana Hakim only ordered 15 day supply. S/w magrinat and he is seeing if it will help. We can order 30 day supply if copay is the same. Order changed to #60.

## 2016-08-16 ENCOUNTER — Other Ambulatory Visit: Payer: Self-pay | Admitting: Obstetrics and Gynecology

## 2016-08-17 ENCOUNTER — Ambulatory Visit: Payer: BLUE CROSS/BLUE SHIELD | Attending: Psychology | Admitting: Psychology

## 2016-08-17 DIAGNOSIS — R4189 Other symptoms and signs involving cognitive functions and awareness: Secondary | ICD-10-CM

## 2016-08-17 DIAGNOSIS — F09 Unspecified mental disorder due to known physiological condition: Secondary | ICD-10-CM | POA: Diagnosis not present

## 2016-08-17 LAB — CYTOLOGY - PAP

## 2016-08-20 NOTE — Progress Notes (Addendum)
[facility]  [address]   Telephone [phone] [address] Fax [phone] [place], [place] [address]  Initial Contact Note  Name:  Misty [name] Date of Birth; March 05, 1976 MRN:  [id number] Date:  08/20/2016  Misty [name] is an 40 y.o. female who was referred for neuropsychological evaluation by Tora Duck, MD due to complaints of cognitive difficulties post-chemotherapy.    A total of 6 hours was spent today reviewing medical records, interviewing (CPT [phone]) Misty [name] and her friend, administering and scoring neurocognitive tests (CPT [phone], [phone] & [phone]).  Preliminary Diagnostic Impression: Unspecified mild neurocognitive disorder [R41.89]  There were no concerns expressed or behaviors displayed by [name] that would require immediate attention.   A full report will follow once the planned testing has been completed. Her next appointment is scheduled for 08/29/16.   Jamey Ripa, Ph.D Licensed Psychologist 08/20/2016

## 2016-08-29 ENCOUNTER — Ambulatory Visit: Payer: BLUE CROSS/BLUE SHIELD | Attending: Psychology | Admitting: Psychology

## 2016-08-29 DIAGNOSIS — R4189 Other symptoms and signs involving cognitive functions and awareness: Secondary | ICD-10-CM | POA: Insufficient documentation

## 2016-08-29 DIAGNOSIS — F419 Anxiety disorder, unspecified: Secondary | ICD-10-CM | POA: Insufficient documentation
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
## 2016-08-31 ENCOUNTER — Encounter: Payer: Self-pay | Admitting: Psychology

## 2016-09-05 ENCOUNTER — Telehealth: Payer: Self-pay

## 2016-09-05 NOTE — Telephone Encounter (Signed)
Pt called stating she had her psychologist memory test and wants to discuss it with Dr Jana Hakim. She is also not feeling well, losing weight and having headaches. S/w Dr Jana Hakim and called pt back. She can see Dr Jana Hakim at next available which will be in mid October. She can see [facility] or her PCP for not feeling well if she needs to. Pt stated she will wait for appt with Dr Jana Hakim. Instructed to call if she gets worse or to see her PCP. inbasket sent.

## 2016-09-07 ENCOUNTER — Telehealth: Payer: Self-pay | Admitting: Oncology

## 2016-09-07 NOTE — Telephone Encounter (Signed)
Spoke with pt to confirm 10/25 appt per los

## 2016-10-17 ENCOUNTER — Other Ambulatory Visit (HOSPITAL_BASED_OUTPATIENT_CLINIC_OR_DEPARTMENT_OTHER): Payer: BLUE CROSS/BLUE SHIELD

## 2016-10-17 ENCOUNTER — Ambulatory Visit (HOSPITAL_BASED_OUTPATIENT_CLINIC_OR_DEPARTMENT_OTHER): Payer: BLUE CROSS/BLUE SHIELD | Admitting: Oncology

## 2016-10-17 ENCOUNTER — Other Ambulatory Visit: Payer: Self-pay | Admitting: *Deleted

## 2016-10-17 VITALS — BP 116/55 | HR 79 | Temp 98.3°F | Resp 18 | Ht 61.0 in | Wt 115.1 lb

## 2016-10-17 DIAGNOSIS — C50511 Malignant neoplasm of lower-outer quadrant of right female breast: Secondary | ICD-10-CM | POA: Diagnosis not present

## 2016-10-17 DIAGNOSIS — Z170 Estrogen receptor positive status [ER+]: Secondary | ICD-10-CM | POA: Diagnosis not present

## 2016-10-17 DIAGNOSIS — R519 Headache, unspecified: Secondary | ICD-10-CM

## 2016-10-17 DIAGNOSIS — R51 Headache: Secondary | ICD-10-CM

## 2016-10-17 LAB — CBC WITH DIFFERENTIAL/PLATELET
BASO%: 0.4 % (ref 0.0–2.0)
BASOS ABS: 0 10*3/uL (ref 0.0–0.1)
EOS%: 2.7 % (ref 0.0–7.0)
Eosinophils Absolute: 0.2 10*3/uL (ref 0.0–0.5)
HCT: 43 % (ref 34.8–46.6)
HEMOGLOBIN: 14.2 g/dL (ref 11.6–15.9)
LYMPH%: 14.5 % (ref 14.0–49.7)
MCH: 30.9 pg (ref 25.1–34.0)
MCHC: 33.1 g/dL (ref 31.5–36.0)
MCV: 93.5 fL (ref 79.5–101.0)
MONO#: 0.4 10*3/uL (ref 0.1–0.9)
MONO%: 7.1 % (ref 0.0–14.0)
NEUT#: 4.5 10*3/uL (ref 1.5–6.5)
NEUT%: 75.3 % (ref 38.4–76.8)
Platelets: 168 10*3/uL (ref 145–400)
RBC: 4.59 10*6/uL (ref 3.70–5.45)
RDW: 13.4 % (ref 11.2–14.5)
WBC: 6 10*3/uL (ref 3.9–10.3)
lymph#: 0.9 10*3/uL (ref 0.9–3.3)

## 2016-10-17 LAB — COMPREHENSIVE METABOLIC PANEL
ALBUMIN: 4 g/dL (ref 3.5–5.0)
ALT: 16 U/L (ref 0–55)
AST: 21 U/L (ref 5–34)
Alkaline Phosphatase: 107 U/L (ref 40–150)
Anion Gap: 8 mEq/L (ref 3–11)
BUN: 17 mg/dL (ref 7.0–26.0)
CHLORIDE: 107 meq/L (ref 98–109)
CO2: 28 meq/L (ref 22–29)
Calcium: 10.2 mg/dL (ref 8.4–10.4)
Creatinine: 0.7 mg/dL (ref 0.6–1.1)
EGFR: >90 mL/min/{1.73_m2} (ref >90)
GLUCOSE: 98 mg/dL (ref 70–140)
POTASSIUM: 4.9 meq/L (ref 3.5–5.1)
SODIUM: 143 meq/L (ref 136–145)
Total Bilirubin: 0.44 mg/dL (ref 0.20–1.20)
Total Protein: 7.5 g/dL (ref 6.4–8.3)

## 2016-10-17 MED ORDER — TAMOXIFEN CITRATE 20 MG PO TABS: 20.0000 mg | ORAL_TABLET | Freq: Every day | ORAL | 4 refills | Status: DC

## 2016-10-17 NOTE — Progress Notes (Signed)
[place]  [phone] [phone]) [phone]     ID: EMMRY HINSCH DOB: [birth year]  MR#: [id number]  VVZ#:[id number]  Patient Care Team: Glenda Chroman, MD as PCP - General (Internal Medicine) Fanny Skates, MD as Consulting Physician (General Surgery) Chauncey Cruel, MD as Consulting Physician (Oncology) Jamey Ripa, PhD as Consulting Physician (Psychology) PCP: Glenda Chroman, MD Freda Munro MD (GYN) Theodoro Kos M.D. (Plastics)  CHIEF COMPLAINT: Estrogen receptor positive breast cancer  CURRENT TREATMENT: Tamoxifen  BREAST CANCER HISTORY: From the original intake note:  Zohar was closely followed for some right breast changes between 20 11/12/2012. Everything seemed stable at that time. In 2014 she was promoted in her job, was working very hard, and actually missed having a mammogram. Late in January 2015 she noted some pain in her right breast and by February when she lifted her arms she noted that the nipple was tilting sideways and seemed a little bit of gathered. She didn't think much of this however. It was not until July that she noted a palpable mass in the lateral right breast. She brought this to her gynecologist's attention and she was set up for diagnostic mammography at [facility] 08/04/2014.   There was an area of distortion in the lateral aspect of the right breast noted on mammography,  corresponding to a palpable firm mass at the 8:00 position of the right breast. Ultrasound confirmed a 2.4 cm irregular hypoechoic spiculated mass in the right breast at the 8:00 position and in addition at the 5:00 position 1 cm from the nipple there was a taller than wide irregular hypoechoic mass measuring 0.8 cm. There was no right axillary adenopathy. In the left breast upper-outer quadrant there was a minimally complicated cyst measuring 0.7 cm.  On 08/11/2014 the patient underwent biopsy of the 2 right breast masses noted on ultrasound. Both  showed invasive ductal carcinoma, grade 1, estrogen receptor 90% positive, progesterone receptor 90% positive, both with strong staining intensity, HER-2 equivocal at 2+ but negative by FISH, with the signals ratio being 1.20 and the number per nucleus 2.1. The proliferation fraction by MIB-1 was 17% ([id number]; this report was reviewed by our pathologist, who concurred, [name] [phone]).  On 08/25/2014 the patient underwent bilateral breast MRI at [place]. This showed, in the right breast, an irregular enhancing mass at the 8:00 position measuring 2.7 cm. Also in the right breast at the 6:00 position there was another enhancing mass measuring 0.6 cm, located 2 cm away from the larger mass. There were also masses in the retroareolar position measuring 0.7 cm, in the posterior central right breast measuring 0.5 cm, and another one just inferior to the midline measuring 5.5 cm. There were no masses or findings of concern in the left breast and no abnormal appearing lymph nodes.  The patient's subsequent history is as detailed below  INTERVAL HISTORY: Dhyana returns today for follow up of her breast cancer accompanied by her husband [name].  She continues on tamoxifen, generally with good tolerance. She does have hot flashes but describes these are "not bad". She obtains a drug at a good price.  REVIEW OF SYSTEMS: Amelianna Meller to have significant stress and anxiety related to her work.She had a very thorough neuropsychological evaluation which found some cognitive disabilities as well as anxiety. The opinion of the specialist was that this might be a permanent problem and he suggested several adjustments to her current work routine which unfortunately in her  case are not going to be possible. 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  She will have to deal with in experience workers and this is really going to be more than she can handle, she feels. Quite aside from those issues she is sleeping poorly and she has lost approximately 15 pounds  (she was 130 pounds on May 2016, is 115 pounds today.). She tells me her celiac disease is not the problem. It is well-controlled and she is careful to avoid all gluten she does he gluten free past, gluten-free bread, and so on. Her husband has not lost any weight. She does have occasional problems with abdominal pain. She does have fairly constant headaches. These are not very severe. They can't be moderately controlled on Tylenol. There are not obviously related to sinus problems or visual changes. She feels forgetful and anxious but not depressed. A detailed review of systems today was otherwise stable.   PAST MEDICAL HISTORY: Past Medical History:  Diagnosis Date  . Anxiety    situational  . Cancer ([place])    breast cancer  . Celiac disease   . Headache(784.0)   . Hypothyroidism   . PONV (postoperative nausea and vomiting)   . Radiation 04/18/15-05/26/15   right breast  . Thyroid disease     PAST SURGICAL HISTORY: Past Surgical History:  Procedure Laterality Date  . CESAREAN SECTION    . CHOLECYSTECTOMY    . GALLBLADDER SURGERY    . LAPAROSCOPIC ASSISTED VAGINAL HYSTERECTOMY N/A 09/27/2015   Procedure: LAPAROSCOPIC ASSISTED VAGINAL HYSTERECTOMY;  Surgeon: Olga Millers, MD;  Location: [facility];  Service: Gynecology;  Laterality: N/A;  . LAPAROSCOPIC BILATERAL SALPINGO OOPHERECTOMY Bilateral 09/27/2015   Procedure: LAPAROSCOPIC BILATERAL SALPINGO OOPHORECTOMY;  Surgeon: Olga Millers, MD;  Location: [facility];  Service: Gynecology;  Laterality: Bilateral;  . PORT-A-CATH REMOVAL N/A 09/27/2015   Procedure: REMOVAL PORT-A-CATH;  Surgeon: Fanny Skates, MD;  Location: [facility];  Service: General;  Laterality: N/A;  Porta Cath removed intact without defects  . PORTACATH PLACEMENT Right  10/22/2014   Procedure: INSERTION PORT-A-CATH;  Surgeon: Fanny Skates, MD;  Location: [facility];  Service: General;  Laterality: Right;  . SIMPLE MASTECTOMY WITH AXILLARY SENTINEL NODE BIOPSY Right 09/28/2014   Procedure: RIGHT TOTAL MASTECTOMY, RIGHT  AXILLARY SENTINEL NODE BIOPSY;  Surgeon: Fanny Skates, MD;  Location: [facility];  Service: General;  Laterality: Right;    FAMILY HISTORY Family History  Problem Relation Age of Onset  . Skin cancer Father   . Breast cancer Maternal Grandmother     dx unknown age  . Breast cancer Other 77    mat great aunt through [facility] with breast cancer   the patient's parents are both living, in their late 78s. The patient had one brother, no sisters. The patient's maternal grandmother was diagnosed with breast cancer in her late 20s. One of that grandmother's sisters was also diagnosed with breast cancer, in her 70s. There is no other history of breast or ovarian cancer in the family  GYNECOLOGIC HISTORY:  No LMP recorded. Patient is not currently having periods (Reason: Chemotherapy). Menarche age 89, first live birth age 81, the patient is GX P1. 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 Her husband, [name], works as a Engineer, structural in [place]. Their son [name] is 89. The patient attends a local [employer]: Not in place   HEALTH MAINTENANCE: Social History  Substance Use Topics  . Smoking status: Never Smoker  . Smokeless tobacco: Not on file  . Alcohol use No     Colonoscopy:  PAP: 07/29/2014  Bone density:  Lipid panel:  Allergies  Allergen Reactions  . Gluten Meal Other (See Comments)    Has celiac's disease  .  Penicillins Nausea And Vomiting    Has patient had a PCN reaction causing immediate rash, facial/tongue/throat swelling, SOB or lightheadedness with hypotension: No Has patient had a PCN reaction causing severe rash involving mucus membranes or skin necrosis: No Has patient had a PCN reaction that required hospitalization No Has patient had a PCN reaction occurring within the last 10 years: No If all of the above answers are "NO", then may proceed with Cephalosporin use. Son has had a severe reaction to it    Current Outpatient Prescriptions  Medication Sig Dispense Refill  . levothyroxine (SYNTHROID, LEVOTHROID) 88 MCG tablet Take 88 mcg by mouth daily before breakfast.    . metoprolol tartrate (LOPRESSOR) 25 MG tablet Take 1 tablet (25 mg total) by mouth 2 (two) times daily. 30 tablet 3  . tamoxifen (NOLVADEX) 20 MG tablet Take 1 tablet (20 mg total) by mouth daily. 90 tablet 4   No current facility-administered medications for this visit.     OBJECTIVE: Middle-aged white woman what appears stated age 1:   10/17/16 0928  BP: (!) 116/55  Pulse: 79  Resp: 18  Temp: 98.3 F (36.8 C)     Body mass index is 21.75 kg/m.    ECOG FS:1 - Symptomatic but completely ambulatory  Sclerae unicteric, pupils round and equal Oropharynx clear and moist-- no thrush or other lesions No cervical or supraclavicular adenopathy Lungs no rales or rhonchi Heart regular rate and rhythm Abd soft, nontender, positive bowel sounds MSK no focal spinal tenderness, no upper extremity lymphedema Neuro: nonfocal, well oriented, appropriate affect Breasts: the right breast is status post mastectomy followed by radiation. There is no evidence of local recurrence. The right axilla is benign. The left breast is unremarkable except for minimal nipple inversion, which is not a new problem.  LAB RESULTS:  CMP     Component Value Date/Time   NA 143 10/17/2016 0850   K 4.9 10/17/2016 0850   CL 104  09/23/2014 1456   CO2 28 10/17/2016 0850   GLUCOSE 98 10/17/2016 0850   BUN 17.0 10/17/2016 0850   CREATININE 0.7 10/17/2016 0850   CALCIUM 10.2 10/17/2016 0850   PROT 7.5 10/17/2016 0850   ALBUMIN 4.0 10/17/2016 0850   AST 21 10/17/2016 0850   ALT 16 10/17/2016 0850   ALKPHOS 107 10/17/2016 0850   BILITOT 0.44 10/17/2016 0850   GFRNONAA >90 09/23/2014 1456   GFRAA >90 09/23/2014 1456    I No results found for: SPEP  Lab Results  Component Value Date   WBC 6.0 10/17/2016   NEUTROABS 4.5 10/17/2016   HGB 14.2 10/17/2016   HCT 43.0 10/17/2016   MCV 93.5 10/17/2016   PLT 168 10/17/2016      Chemistry      Component Value Date/Time  NA 143 10/17/2016 0850   K 4.9 10/17/2016 0850   CL 104 09/23/2014 1456   CO2 28 10/17/2016 0850   BUN 17.0 10/17/2016 0850   CREATININE 0.7 10/17/2016 0850      Component Value Date/Time   CALCIUM 10.2 10/17/2016 0850   ALKPHOS 107 10/17/2016 0850   AST 21 10/17/2016 0850   ALT 16 10/17/2016 0850   BILITOT 0.44 10/17/2016 0850       No results found for: LABCA2  No components found for: LABCA125  No results for input(s): INR in the last 168 hours.  Urinalysis No results found for: COLORURINE  STUDIES: No results found.    ASSESSMENT: 40 y.o. BRCA negative [place], [place] woman status post biopsy of 2 separate right breast lower outer quadrant masses 08/11/2014, both positive for an invasive ductal carcinoma, grade 1, estrogen and progesterone receptor strongly positive, with an MIB-1 of 19%, and HER-2 nonamplified  (1) genetics testing sent 08/26/2014, showing no mutations in the BRCA genes; there were also no mutations in ATM, BARD 1, BRIP1, Amorita 1, CHE K2, MR E11A, M UT YH, N BN, NF1, PA L B2, PTE N, RAD 50, RAD 50 1C, RAD 50 1D, and T p53.  (2) status post right mastectomy with sentinel lymph node sampling 09/28/2014 for an mpT2 pN1, stage IIB invasive breast cancer (with both ductal and lobular features), grade 2, with  negative margins  (3) adjuvant chemotherapy consisted of doxorubicin and cyclophosphamide in dose dense fashion 4, completed 12/30/2014, followed by paclitaxel weekly x 12 started 01/21/2015.  (a) paclitaxel stopped after 5 treatments due to persistent neuropathy, last dose 02/18/2015  (4) adjuvant radiation given 04/18/2015-05/26/2015: Right chest wall/50.4 Pear[place] Cables @ 1.8 Pearline Cables per fraction x 28 fractions Right supraclavicular fossa/PAB 45 Gy @1 .8 Gy per fraction x 25 fractions Right scar boost / 10 [place] at [employer] per fraction x 5 fractions  (5) started tamoxifen 07/07/2015  (a) s/p TAH-BSO 09/27/2015 with benign pathology  (6) history of celiac disease  (7) neuro psychologic testing 08/29/2016 identified deficiencies on immediate auditory verbal memory span and verbal learning capacity, within the borderline impaired and low average ranges. 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 Anxiety and stress are seen as contributing to a worsening of her cognitive functioning but not as causing her cognitive difficulties. It is not known how long her cognitive difficulties might persist."  PLAN: Allana is now 2 years out from definitive surgery for her breast cancer, with no evidence of disease recurrence. This is very favorable.  The 1 concern I have in that regard it is her persistent headaches. I am setting her up for brain MRI Next week. I do expect it to be benign. In that case her headaches would probably be referable to her generalized stress problems.  That is related to her job. Basically she has a very high responsibility job which also involves supervising other people. it takes more than 8 hours today, is complex, requires her to make very complex decisions and keep many things in mind, and she simply is not able to measure up. 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 According to her neuropsychologist's note this could well be permanent. Certainly the fact that it has been 2 years now from her original surgery and oneand a half years after her last chemotherapy dose, with continuing problems, suggests that it is not going  to be resolved anytime soon, if ever.  We discussed possibly using Remeron, which might increase her appetite, help her sleep, and possibly reduce the anxiety, but she is afraid any medication will only make matters worse.  I think her weight gain is can it be most likely related to the anxiety and stress issues. We discussed increasing her carbohydrates. Her ideal weight would be approximately 120 pounds so the goal is for her to gain about 5 pounds before she returns to see me.  From the point of view of her breast cancer, I will see her again in 6 months. She will have repeat mammography in December or January. She knows to call for any problems that may develop before that visit.   Marland KitchenChauncey Cruel, MD   10/17/2016 10:26 AM

## 2016-10-18 ENCOUNTER — Telehealth: Payer: Self-pay | Admitting: *Deleted

## 2016-10-18 ENCOUNTER — Telehealth: Payer: Self-pay

## 2016-10-18 NOTE — Telephone Encounter (Signed)
Disability form filled out and pt notified it is ready to be picked up at the front desk of [facility].

## 2016-10-18 NOTE — Telephone Encounter (Signed)
Left message at pt's home & mobile # to call back to let us know date of disability (date last worked).

## 2016-10-24 ENCOUNTER — Ambulatory Visit (HOSPITAL_COMMUNITY)
Admission: RE | Admit: 2016-10-24 | Discharge: 2016-10-24 | Disposition: A | Discharge status: Home / Self Care | Payer: BLUE CROSS/BLUE SHIELD | Source: Ambulatory Visit | Attending: Oncology | Admitting: Oncology

## 2016-10-24 DIAGNOSIS — Z170 Estrogen receptor positive status [ER+]: Secondary | ICD-10-CM | POA: Diagnosis not present

## 2016-10-24 DIAGNOSIS — C50511 Malignant neoplasm of lower-outer quadrant of right female breast: Secondary | ICD-10-CM | POA: Diagnosis not present

## 2016-10-24 DIAGNOSIS — R51 Headache: Secondary | ICD-10-CM | POA: Insufficient documentation

## 2016-10-24 DIAGNOSIS — R519 Headache, unspecified: Secondary | ICD-10-CM

## 2016-10-24 MED ORDER — GADOBENATE DIMEGLUMINE 529 MG/ML IV SOLN
10.0000 mL | Freq: Once | INTRAVENOUS | Status: AC | PRN
  Administered 2016-10-24: 10 mL via INTRAVENOUS

## 2016-10-25 ENCOUNTER — Telehealth: Payer: Self-pay | Admitting: *Deleted

## 2016-10-25 ENCOUNTER — Encounter: Payer: Self-pay | Admitting: *Deleted

## 2016-10-25 NOTE — Telephone Encounter (Signed)
Pt state she had an MRI yesterday. Radiology told her to call MD for a follow up appt or to get results.

## 2016-10-25 NOTE — Telephone Encounter (Signed)
This RN obtained results and called pt with negative reading with incidental finding of " empty sella ".  Above finding discussed including possible relation to noted symptoms of ongoing headaches and fatigue.  This RN discussed above is an incidental benign finding and treatment is management of symptoms.  Per discussion this RN reiterated possible benefit with medication for serotonin replacement which may be affected by above and known thyroid dysfunction.  Presently Misty [name] does not want to initiate any new meds.  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  No other needs at this time.

## 2016-10-29 ENCOUNTER — Other Ambulatory Visit: Payer: Self-pay | Admitting: Oncology

## 2016-10-29 DIAGNOSIS — E236 Other disorders of pituitary gland: Secondary | ICD-10-CM | POA: Insufficient documentation

## 2016-11-14 ENCOUNTER — Encounter: Payer: Self-pay | Admitting: *Deleted

## 2016-11-14 ENCOUNTER — Other Ambulatory Visit: Payer: Self-pay | Admitting: *Deleted

## 2016-11-27 ENCOUNTER — Ambulatory Visit: Payer: BLUE CROSS/BLUE SHIELD | Admitting: Oncology

## 2016-11-27 ENCOUNTER — Other Ambulatory Visit: Payer: BLUE CROSS/BLUE SHIELD

## 2016-11-28 ENCOUNTER — Telehealth: Payer: Self-pay | Admitting: Medical Oncology

## 2016-11-28 NOTE — Telephone Encounter (Signed)
Pt called from work today to report that for the past 1.5 weeks she has been having heart palpitations , dizziness and feel like she is going  to pass out, especially if she walks a lot.  She has heaviness in her chest and the tops of her feet go numb. Pt saw her "thyroid specialist when these symptoms started and reports her thyroid levels were all "normal." I instructed pt to contact her PCP with these symptoms. She then said she had an ekg here ( I cannot find one) and she thought this is where she  needed to start. I told her if we find an ekg then we can fax it to her pcp.

## 2016-12-03 ENCOUNTER — Telehealth: Payer: Self-pay | Admitting: Emergency Medicine

## 2016-12-03 ENCOUNTER — Encounter: Payer: Self-pay | Admitting: Emergency Medicine

## 2016-12-03 NOTE — Telephone Encounter (Signed)
Called patient for follow up; per patient no new changes noticed. States she is still experiencing the dizziness and heart palpitations especially during exertion; States that she doesn't want to see her PCP for this; states she had an EKG here before chemo; Upon further investigation; patient had an echo cardiogram done prior to chemo; not an EKG.

## 2016-12-03 NOTE — Progress Notes (Signed)
Patient states she has a family history of "defective heart valve". Patient states she doesn't want to see PCP because they will send her to [facility] for EKG and she doesn't want to do that.

## 2016-12-10 ENCOUNTER — Other Ambulatory Visit: Payer: Self-pay | Admitting: Oncology

## 2016-12-10 ENCOUNTER — Ambulatory Visit
Admission: RE | Admit: 2016-12-10 | Discharge: 2016-12-10 | Disposition: A | Discharge status: Home / Self Care | Payer: BLUE CROSS/BLUE SHIELD | Source: Ambulatory Visit | Attending: Oncology | Admitting: Oncology

## 2016-12-10 DIAGNOSIS — C50511 Malignant neoplasm of lower-outer quadrant of right female breast: Secondary | ICD-10-CM

## 2016-12-10 NOTE — Telephone Encounter (Signed)
Val let's give her a call back and ask how she's doing if not already done  [place]!

## 2016-12-11 ENCOUNTER — Other Ambulatory Visit: Payer: Self-pay | Admitting: *Deleted

## 2016-12-11 DIAGNOSIS — R002 Palpitations: Secondary | ICD-10-CM

## 2016-12-11 DIAGNOSIS — C50511 Malignant neoplasm of lower-outer quadrant of right female breast: Secondary | ICD-10-CM

## 2016-12-11 DIAGNOSIS — Z09 Encounter for follow-up examination after completed treatment for conditions other than malignant neoplasm: Secondary | ICD-10-CM

## 2016-12-11 DIAGNOSIS — Z170 Estrogen receptor positive status [ER+]: Principal | ICD-10-CM

## 2016-12-11 NOTE — Telephone Encounter (Signed)
This RN contacted pt for up date post call last week. Misty [name] states condition still occurring intermittantly.  She does not see any associated activity related to episodes nor any clamminess or shortness of breath.  " just kind feel my heart do a flutter and then I get a little dizzy "  Misty [name] has never been seen by a cardiologist but did wear a halter monitoring " years ago under my primary MD ". Results were negative per the outcome " but that was years ago "  Misty [name] would prefer not to have cardiac workup under primary MD " because he works out of [facility] and I do not want to return there "  Pt did receive doxirubicin with breast cancer diagnosis - baseline ECHO obtained with normal readings.  Above discussed as well as Misty [name] concern for " there is a lot of heart issues in my family "  Per above - ECHO order placed for initial evaluation to be done at [facility] for appropriate comparison.  Order entered and in box request sent to schedule.  Further follow up will be discussed post result with Misty [name] understanding if symptoms worsen she should proceed to the ER for evaluation.

## 2016-12-25 ENCOUNTER — Ambulatory Visit (HOSPITAL_COMMUNITY)
Admission: RE | Admit: 2016-12-25 | Discharge: 2016-12-25 | Disposition: A | Discharge status: Home / Self Care | Payer: BLUE CROSS/BLUE SHIELD | Source: Ambulatory Visit | Attending: Oncology | Admitting: Oncology

## 2016-12-25 DIAGNOSIS — Z170 Estrogen receptor positive status [ER+]: Secondary | ICD-10-CM | POA: Diagnosis present

## 2016-12-25 DIAGNOSIS — Z09 Encounter for follow-up examination after completed treatment for conditions other than malignant neoplasm: Secondary | ICD-10-CM

## 2016-12-25 DIAGNOSIS — R002 Palpitations: Secondary | ICD-10-CM | POA: Diagnosis not present

## 2016-12-25 DIAGNOSIS — C50511 Malignant neoplasm of lower-outer quadrant of right female breast: Secondary | ICD-10-CM

## 2016-12-25 DIAGNOSIS — C50919 Malignant neoplasm of unspecified site of unspecified female breast: Secondary | ICD-10-CM | POA: Insufficient documentation

## 2016-12-25 NOTE — Progress Notes (Signed)
  Echocardiogram 2D Echocardiogram has been performed.  Misty [name] 12/25/2016, 9:46 AM

## 2017-03-21 ENCOUNTER — Other Ambulatory Visit: Payer: Self-pay | Admitting: Endocrinology

## 2017-03-21 DIAGNOSIS — E010 Iodine-deficiency related diffuse (endemic) goiter: Secondary | ICD-10-CM

## 2017-03-27 ENCOUNTER — Ambulatory Visit
Admission: RE | Admit: 2017-03-27 | Discharge: 2017-03-27 | Disposition: A | Discharge status: Home / Self Care | Payer: BLUE CROSS/BLUE SHIELD | Source: Ambulatory Visit | Attending: Endocrinology | Admitting: Endocrinology

## 2017-03-27 DIAGNOSIS — E010 Iodine-deficiency related diffuse (endemic) goiter: Secondary | ICD-10-CM

## 2017-04-16 ENCOUNTER — Ambulatory Visit (HOSPITAL_BASED_OUTPATIENT_CLINIC_OR_DEPARTMENT_OTHER): Payer: BLUE CROSS/BLUE SHIELD | Admitting: Oncology

## 2017-04-16 ENCOUNTER — Ambulatory Visit (HOSPITAL_COMMUNITY)
Admission: RE | Admit: 2017-04-16 | Discharge: 2017-04-16 | Disposition: A | Discharge status: Home / Self Care | Payer: BLUE CROSS/BLUE SHIELD | Source: Ambulatory Visit | Attending: Oncology | Admitting: Oncology

## 2017-04-16 ENCOUNTER — Other Ambulatory Visit: Payer: BLUE CROSS/BLUE SHIELD

## 2017-04-16 VITALS — BP 98/52 | HR 80 | Temp 98.2°F | Resp 18 | Ht 61.0 in | Wt 118.2 lb

## 2017-04-16 DIAGNOSIS — C50511 Malignant neoplasm of lower-outer quadrant of right female breast: Secondary | ICD-10-CM | POA: Diagnosis present

## 2017-04-16 DIAGNOSIS — Z170 Estrogen receptor positive status [ER+]: Secondary | ICD-10-CM | POA: Insufficient documentation

## 2017-04-16 DIAGNOSIS — Z9011 Acquired absence of right breast and nipple: Secondary | ICD-10-CM | POA: Insufficient documentation

## 2017-04-16 DIAGNOSIS — E236 Other disorders of pituitary gland: Secondary | ICD-10-CM

## 2017-04-16 MED ORDER — TAMOXIFEN CITRATE 20 MG PO TABS: 20.0000 mg | ORAL_TABLET | Freq: Every day | ORAL | 4 refills | Status: DC

## 2017-04-16 NOTE — Progress Notes (Signed)
[place]  [phone] [phone]     ID: FAELYN [name] DOB: [birth year]  MR#: [id number]  XTK#:[id number]  Patient Care Team: Glenda Chroman, MD as PCP - General (Internal Medicine) Fanny Skates, MD as Consulting Physician (General Surgery) Chauncey Cruel, MD as Consulting Physician (Oncology) Jamey Ripa, PhD (Inactive) as Consulting Physician (Psychology) PCP: Glenda Chroman, MD Freda Munro MD (GYN) Theodoro Kos M.D. (Plastics)  CHIEF COMPLAINT: Estrogen receptor positive breast cancer  CURRENT TREATMENT: Tamoxifen  BREAST CANCER HISTORY: From the original intake note:  [name] was closely followed for some right breast changes between 20 11/12/2012. Everything seemed stable at that time. In 2014 she was promoted in her job, was working very hard, and actually missed having a mammogram. Late in January 2015 she noted some pain in her right breast and by February when she lifted her arms she noted that the nipple was tilting sideways and seemed a little bit of gathered. She didn't think much of this however. It was not until July that she noted a palpable mass in the lateral right breast. She brought this to her gynecologist's attention and she was set up for diagnostic mammography at [facility] 08/04/2014.   There was an area of distortion in the lateral aspect of the right breast noted on mammography,  corresponding to a palpable firm mass at the 8:00 position of the right breast. Ultrasound confirmed a 2.4 cm irregular hypoechoic spiculated mass in the right breast at the 8:00 position and in addition at the 5:00 position 1 cm from the nipple there was a taller than wide irregular hypoechoic mass measuring 0.8 cm. There was no right axillary adenopathy. In the left breast upper-outer quadrant there was a minimally complicated cyst measuring 0.7 cm.  On 08/11/2014 the patient underwent biopsy of the 2 right breast masses noted on  ultrasound. 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 The proliferation fraction by MIB-1 was 17% ([id number]; this report was reviewed by our pathologist, who concurred, [name] [phone]).  On 08/25/2014 the patient underwent bilateral breast MRI at [place]. This showed, in the right breast, an irregular enhancing mass at the 8:00 position measuring 2.7 cm. Also in the right breast at the 6:00 position there was another enhancing mass measuring 0.6 cm, located 2 cm away from the larger mass. There were also masses in the retroareolar position measuring 0.7 cm, in the posterior central right breast measuring 0.5 cm, and another one just inferior to the midline measuring 5.5 cm. There were no masses or findings of concern in the left breast and no abnormal appearing lymph nodes.  The patient's subsequent history is as detailed below  INTERVAL HISTORY: Misty [name] returns today for follow-up of her estrogen receptor positive breast cancer. She continues on tamoxifen, generally with good tolerance. She has some hot flashes but "there are not too bad". She denies vaginal wetness problems. She obtains it at a good price.   REVIEW OF SYSTEMS: Misty [name]  is disabled secondary to her neurological problems following her earlier treatments. Also she has an empty sella problems associated with headaches. She has not at some shooting pains in the surgical breast, but denies soreness, unusual sensitivity, swelling, or erythema. Most of the time she is taking care of the house  urging care of the kids. She is able to do some exercise chiefly by walking. A detailed review of systems today was otherwise stable   PAST MEDICAL HISTORY: Past Medical History:  Diagnosis Date  . Anxiety    situational  .  Cancer ([place])    breast cancer  . Celiac disease   . Headache(784.0)   . Hypothyroidism   . PONV (postoperative nausea and vomiting)   . Radiation 04/18/15-05/26/15   right breast  . Thyroid disease     PAST SURGICAL HISTORY: Past Surgical History:  Procedure Laterality Date  . CESAREAN SECTION    . CHOLECYSTECTOMY    . GALLBLADDER SURGERY    . LAPAROSCOPIC ASSISTED VAGINAL HYSTERECTOMY N/A 09/27/2015   Procedure: LAPAROSCOPIC ASSISTED VAGINAL HYSTERECTOMY;  Surgeon: Olga Millers, MD;  Location: [facility];  Service: Gynecology;  Laterality: N/A;  . LAPAROSCOPIC BILATERAL SALPINGO OOPHERECTOMY Bilateral 09/27/2015   Procedure: LAPAROSCOPIC BILATERAL SALPINGO OOPHORECTOMY;  Surgeon: Olga Millers, MD;  Location: [facility];  Service: Gynecology;  Laterality: Bilateral;  . PORT-A-CATH REMOVAL N/A 09/27/2015   Procedure: REMOVAL PORT-A-CATH;  Surgeon: Fanny Skates, MD;  Location: [facility];  Service: General;  Laterality: N/A;  Porta Cath removed intact without defects  . PORTACATH PLACEMENT Right 10/22/2014   Procedure: INSERTION PORT-A-CATH;  Surgeon: Fanny Skates, MD;  Location: [facility];  Service: General;  Laterality: Right;  . SIMPLE MASTECTOMY WITH AXILLARY SENTINEL NODE BIOPSY Right 09/28/2014   Procedure: RIGHT TOTAL MASTECTOMY, RIGHT  AXILLARY SENTINEL NODE BIOPSY;  Surgeon: Fanny Skates, MD;  Location: [facility];  Service: General;  Laterality: Right;    FAMILY HISTORY Family History  Problem Relation Age of Onset  . Skin cancer Father   . Breast cancer Maternal Grandmother     dx unknown age  . Breast cancer Other 26    mat great aunt through [facility] with breast cancer   the patient's parents are both living, in their late 42s. The patient had one brother, no sisters. The patient's maternal grandmother was diagnosed with breast cancer in her late 11s. One of that grandmother's sisters was also diagnosed with breast cancer, in her 35s. There is no  other history of breast or ovarian cancer in the family  GYNECOLOGIC HISTORY:  No LMP recorded. Patient is not currently having periods (Reason: Chemotherapy). Menarche age 41, first live birth age 64, the patient is GX P1. 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 Her husband, [name], works as a Engineer, structural in [place]. Their son [name] is 40. The patient attends a local [place]: Not in place   HEALTH MAINTENANCE: Social History  Substance Use Topics  . Smoking status: Never Smoker  . Smokeless tobacco: Not on file  . Alcohol use No     Colonoscopy:  PAP: 07/29/2014  Bone density:  Lipid panel:  Allergies  Allergen Reactions  . Gluten Meal Other (See Comments)    Has celiac's disease  . Penicillins Nausea And Vomiting    Has patient had a PCN reaction causing immediate rash, facial/tongue/throat swelling, SOB or lightheadedness with hypotension: No Has patient had a PCN reaction causing severe rash involving mucus membranes or skin necrosis: No Has patient had a  PCN reaction that required hospitalization No Has patient had a PCN reaction occurring within the last 10 years: No If all of the above answers are "NO", then may proceed with Cephalosporin use. Son has had a severe reaction to it    Current Outpatient Prescriptions  Medication Sig Dispense Refill  . levothyroxine (SYNTHROID, LEVOTHROID) 88 MCG tablet Take 88 mcg by mouth daily before breakfast.    . metoprolol tartrate (LOPRESSOR) 25 MG tablet Take 1 tablet (25 mg total) by mouth 2 (two) times daily. 30 tablet 3  . tamoxifen (NOLVADEX) 20 MG tablet Take 1 tablet (20 mg total) by mouth daily. 90 tablet 4   No current  facility-administered medications for this visit.     OBJECTIVE: Middle-aged white woman In no acute distress  Vitals:   04/16/17 0933  BP: (!) 98/52  Pulse: 80  Resp: 18  Temp: 98.2 F (36.8 C)     Body mass index is 22.33 kg/m.    ECOG FS:1 - Symptomatic but completely ambulatory  Sclerae unicteric, EOMs intact Oropharynx clear and moist No cervical or supraclavicular adenopathy Lungs no rales or rhonchi Heart regular rate and rhythm Abd soft, nontender, positive bowel sounds MSK no focal spinal tenderness, no upper extremity lymphedema Neuro: nonfocal, well oriented, appropriate affect Breasts: The right breast is status post mastectomy and radiation with no evidence of local recurrence. Left breast is unremarkable. The nipple inversion noted today has been present prior. Both axillae are benign  LAB RESULTS:  CMP     Component Value Date/Time   NA 143 10/17/2016 0850   K 4.9 10/17/2016 0850   CL 104 09/23/2014 1456   CO2 28 10/17/2016 0850   GLUCOSE 98 10/17/2016 0850   BUN 17.0 10/17/2016 0850   CREATININE 0.7 10/17/2016 0850   CALCIUM 10.2 10/17/2016 0850   PROT 7.5 10/17/2016 0850   ALBUMIN 4.0 10/17/2016 0850   AST 21 10/17/2016 0850   ALT 16 10/17/2016 0850   ALKPHOS 107 10/17/2016 0850   BILITOT 0.44 10/17/2016 0850   GFRNONAA >90 09/23/2014 1456   GFRAA >90 09/23/2014 1456    I No results found for: SPEP  Lab Results  Component Value Date   WBC 6.0 10/17/2016   NEUTROABS 4.5 10/17/2016   HGB 14.2 10/17/2016   HCT 43.0 10/17/2016   MCV 93.5 10/17/2016   PLT 168 10/17/2016      Chemistry      Component Value Date/Time   NA 143 10/17/2016 0850   K 4.9 10/17/2016 0850   CL 104 09/23/2014 1456   CO2 28 10/17/2016 0850   BUN 17.0 10/17/2016 0850   CREATININE 0.7 10/17/2016 0850      Component Value Date/Time   CALCIUM 10.2 10/17/2016 0850   ALKPHOS 107 10/17/2016 0850   AST 21 10/17/2016 0850   ALT 16 10/17/2016 0850   BILITOT 0.44  10/17/2016 0850       No results found for: LABCA2  No components found for: LABCA125  No results for input(s): INR in the last 168 hours.  Urinalysis No results found for: COLORURINE  STUDIES: US Thyroid  Result Date: 03/29/2017 CLINICAL DATA:  Thyromegaly EXAM: THYROID ULTRASOUND TECHNIQUE: Ultrasound examination of the thyroid gland and adjacent soft tissues was performed. COMPARISON:  None. FINDINGS: Parenchymal Echotexture: Moderately heterogenous Isthmus: 0.2 cm thickness Right lobe: 3.4 x 1 x 0.8 cm Left lobe: 2.6 x 0.8 x 1 cm _________________________________________________________ Estimated total number of nodules >/= 1 cm: 3 Number of  spongiform nodules >/=  2 cm not described below (TR1): 0 Number of mixed cystic and solid nodules >/= 1.5 cm not described below ([place]): 0 _________________________________________________________ Nodule # 1: Location: Right; Inferior Maximum size: 1.1 cm; Other 2 dimensions: 0.7 x 0.9 cm Composition: solid/almost completely solid (2) Echogenicity: hyperechoic (1) Shape: not taller-than-wide (0) Margins: smooth (0) Echogenic foci: macrocalcifications (1) ACR TI-RADS total points: 4. ACR TI-RADS risk category: TR4 (4-6 points). ACR TI-RADS recommendations: *Given size (>/= 1 - 1.4 cm) and appearance, a follow-up ultrasound in 1 year should be considered based on TI-RADS criteria. _________________________________________________________ Nodule # 2: Location: Left; Superior Maximum size: 1.3 cm; Other 2 dimensions: 0.5 x 0.7 cm Composition: solid/almost completely solid (2) Echogenicity: isoechoic (1) Shape: not taller-than-wide (0) Margins: ill-defined (0) Echogenic foci: macrocalcifications (1) ACR TI-RADS total points: 4. ACR TI-RADS risk category: TR4 (4-6 points). ACR TI-RADS recommendations: *Given size (>/= 1 - 1.4 cm) and appearance, a follow-up ultrasound in 1 year should be considered based on TI-RADS criteria.  _________________________________________________________ Nodule # 3: Location: Left; Inferior Maximum size: 1.1 cm; Other 2 dimensions: 0.6 x 0.6 cm Composition: solid/almost completely solid (2) Echogenicity: isoechoic (1) Shape: not taller-than-wide (0) Margins: smooth (0) Echogenic foci: peripheral calcifications (2) ACR TI-RADS total points: 5. ACR TI-RADS risk category: TR4 (4-6 points). ACR TI-RADS recommendations: *Given size (>/= 1 - 1.4 cm) and appearance, a follow-up ultrasound in 1 year should be considered based on TI-RADS criteria. _________________________________________________________ 0.6 cm hypoechoic nodule without calcifications, deep inferior right IMPRESSION: 1. Normal-sized thyroid with small bilateral nodules as detailed above. Recommend 1 year follow-up surveillance ultrasound. The above is in keeping with the ACR TI-RADS recommendations - J Am Coll Radiol 2017;14:587-595. Electronically Signed   By: Lucrezia Europe M.D.   On: 03/29/2017 09:03    Mammography December 2017 with left breast ultrasonography showed the breast density to be category C.  ASSESSMENT: 42 y.o. BRCA negative [place], [place] woman status post biopsy of 2 separate right breast lower outer quadrant masses 08/11/2014, both positive for an invasive ductal carcinoma, grade 1, estrogen and progesterone receptor strongly positive, with an MIB-1 of 19%, and HER-2 nonamplified  (1) genetics testing sent 08/26/2014, showing no mutations in the BRCA genes; there were also no mutations in ATM, BARD 1, BRIP1, [place] 1, CHE K2, MR E11A, M UT YH, N BN, NF1, PA L B2, PTE N, RAD 50, RAD 50 1C, RAD 50 1D, and T p53.  (2) status post right mastectomy with sentinel lymph node sampling 09/28/2014 for an mpT2 pN1, stage IIB invasive breast cancer (with both ductal and lobular features), grade 2, with negative margins  (3) adjuvant chemotherapy consisted of doxorubicin and cyclophosphamide in dose dense fashion 4, completed 12/30/2014,  followed by paclitaxel weekly x 12 started 01/21/2015.  (a) paclitaxel stopped after 5 treatments due to persistent neuropathy, last dose 02/18/2015  (4) adjuvant radiation given 04/18/2015-05/26/2015: Right chest wall/50.4 [place] Cables @ 1.8 Pearline Cables per fraction x 28 fractions Right supraclavicular fossa/PAB 45 Gy _0 .8 Gy per fraction x 25 fractions Right scar boost / 10 [place] at [employer] per fraction x 5 fractions  (5) started tamoxifen 07/07/2015  (a) s/p TAH-BSO 09/27/2015 with benign pathology  (6) history of celiac disease  (7) neuro psychologic testing 08/29/2016 identified deficiencies on immediate auditory verbal memory span and verbal learning capacity, within the borderline impaired and low average ranges. 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 Anxiety and stress are seen  as contributing to a worsening of her cognitive functioning but not as causing her cognitive difficulties. It is not known how long her cognitive difficulties might persist."  (8) Empty sella by brain MRI 10/24/2016  PLAN: Eleina is is now 2-1/2 years out from definitive surgery for her breast cancer with no evidence of disease recurrence. This is very favorable.  She is tolerating the tamoxifen well. The plan is to continue at a minimum of 5 years. At that time she can consider if she wishes to discontinue follow-up or continuing tamoxifen for total of 10 years, which would give her an additional 2-3% risk reduction.  She is very concerned about her thyroid. I gave her a copy of her thyroid ultrasound and she will follow-up with Dr. Amanda Cockayne regarding that.  Her celiac disease is not active at this point. We discussed her empty sella issue. Currently she is having some headaches. I think it would be prudent to obtain one more brain MRI and we have set that up for December of this year.  I reassured her that shooting pains likely once she is experiencing in her  surgical chest side are very common and do not indicate breast cancer recurrence. Nevertheless we are going to obtain a chest x-ray today just to make assurance double sure  Otherwise she will return to see me in one year. She knows to call for any problems that may develop before that visit.  Marland KitchenChauncey Cruel, MD   04/16/2017 9:46 AM

## 2017-04-17 LAB — T4: Thyroxine (T4): 10 ug/dL (ref 4.5–12.0)

## 2017-04-17 LAB — CORTISOL: Cortisol: 6.5 ug/dL

## 2017-10-02 ENCOUNTER — Other Ambulatory Visit: Payer: Self-pay | Admitting: Oncology

## 2017-10-02 DIAGNOSIS — Z1231 Encounter for screening mammogram for malignant neoplasm of breast: Secondary | ICD-10-CM

## 2017-12-19 ENCOUNTER — Ambulatory Visit
Admission: RE | Admit: 2017-12-19 | Discharge: 2017-12-19 | Disposition: A | Discharge status: Home / Self Care | Payer: PRIVATE HEALTH INSURANCE | Source: Ambulatory Visit | Attending: Oncology | Admitting: Oncology

## 2017-12-19 ENCOUNTER — Ambulatory Visit (HOSPITAL_COMMUNITY)
Admission: RE | Admit: 2017-12-19 | Discharge: 2017-12-19 | Disposition: A | Discharge status: Home / Self Care | Payer: Commercial Managed Care - PPO | Source: Ambulatory Visit | Attending: Oncology | Admitting: Oncology

## 2017-12-19 DIAGNOSIS — R51 Headache: Secondary | ICD-10-CM | POA: Insufficient documentation

## 2017-12-19 DIAGNOSIS — E236 Other disorders of pituitary gland: Secondary | ICD-10-CM | POA: Diagnosis not present

## 2017-12-19 DIAGNOSIS — C50511 Malignant neoplasm of lower-outer quadrant of right female breast: Secondary | ICD-10-CM | POA: Diagnosis present

## 2017-12-19 DIAGNOSIS — Z170 Estrogen receptor positive status [ER+]: Secondary | ICD-10-CM | POA: Diagnosis not present

## 2017-12-19 DIAGNOSIS — Z1231 Encounter for screening mammogram for malignant neoplasm of breast: Secondary | ICD-10-CM

## 2017-12-19 MED ORDER — GADOBENATE DIMEGLUMINE 529 MG/ML IV SOLN
10.0000 mL | Freq: Once | INTRAVENOUS | Status: AC | PRN
  Administered 2017-12-19: 10 mL via INTRAVENOUS

## 2018-03-21 ENCOUNTER — Other Ambulatory Visit: Payer: Self-pay | Admitting: Endocrinology

## 2018-03-21 DIAGNOSIS — E049 Nontoxic goiter, unspecified: Secondary | ICD-10-CM

## 2018-03-31 ENCOUNTER — Ambulatory Visit
Admission: RE | Admit: 2018-03-31 | Discharge: 2018-03-31 | Disposition: A | Discharge status: Home / Self Care | Payer: Commercial Managed Care - PPO | Source: Ambulatory Visit | Attending: Endocrinology | Admitting: Endocrinology

## 2018-03-31 DIAGNOSIS — E049 Nontoxic goiter, unspecified: Secondary | ICD-10-CM

## 2018-04-16 ENCOUNTER — Ambulatory Visit: Payer: BLUE CROSS/BLUE SHIELD | Admitting: Oncology

## 2018-04-16 ENCOUNTER — Other Ambulatory Visit: Payer: BLUE CROSS/BLUE SHIELD

## 2018-04-22 ENCOUNTER — Inpatient Hospital Stay (HOSPITAL_BASED_OUTPATIENT_CLINIC_OR_DEPARTMENT_OTHER): Payer: Commercial Managed Care - PPO | Admitting: Oncology

## 2018-04-22 ENCOUNTER — Telehealth: Payer: Self-pay | Admitting: Oncology

## 2018-04-22 ENCOUNTER — Ambulatory Visit: Payer: Commercial Managed Care - PPO | Admitting: Oncology

## 2018-04-22 ENCOUNTER — Other Ambulatory Visit: Payer: Self-pay

## 2018-04-22 ENCOUNTER — Inpatient Hospital Stay: Payer: Commercial Managed Care - PPO | Attending: Oncology

## 2018-04-22 VITALS — BP 103/66 | HR 71 | Temp 98.3°F | Resp 18 | Ht 61.0 in | Wt 120.2 lb

## 2018-04-22 DIAGNOSIS — Z79810 Long term (current) use of selective estrogen receptor modulators (SERMs): Secondary | ICD-10-CM | POA: Diagnosis not present

## 2018-04-22 DIAGNOSIS — Z9011 Acquired absence of right breast and nipple: Secondary | ICD-10-CM

## 2018-04-22 DIAGNOSIS — F419 Anxiety disorder, unspecified: Secondary | ICD-10-CM | POA: Insufficient documentation

## 2018-04-22 DIAGNOSIS — Z9221 Personal history of antineoplastic chemotherapy: Secondary | ICD-10-CM | POA: Insufficient documentation

## 2018-04-22 DIAGNOSIS — K900 Celiac disease: Secondary | ICD-10-CM

## 2018-04-22 DIAGNOSIS — Z170 Estrogen receptor positive status [ER+]: Secondary | ICD-10-CM | POA: Insufficient documentation

## 2018-04-22 DIAGNOSIS — E039 Hypothyroidism, unspecified: Secondary | ICD-10-CM

## 2018-04-22 DIAGNOSIS — Z923 Personal history of irradiation: Secondary | ICD-10-CM | POA: Diagnosis not present

## 2018-04-22 DIAGNOSIS — Z79899 Other long term (current) drug therapy: Secondary | ICD-10-CM | POA: Diagnosis not present

## 2018-04-22 DIAGNOSIS — C50511 Malignant neoplasm of lower-outer quadrant of right female breast: Secondary | ICD-10-CM

## 2018-04-22 LAB — CBC WITH DIFFERENTIAL/PLATELET
BASOS ABS: 0 10*3/uL (ref 0.0–0.1)
Basophils Relative: 1 %
EOS PCT: 5 %
Eosinophils Absolute: 0.2 10*3/uL (ref 0.0–0.5)
HEMATOCRIT: 39.9 % (ref 34.8–46.6)
Hemoglobin: 13.4 g/dL (ref 11.6–15.9)
LYMPHS PCT: 20 %
Lymphs Abs: 0.9 10*3/uL (ref 0.9–3.3)
MCH: 31.4 pg (ref 25.1–34.0)
MCHC: 33.7 g/dL (ref 31.5–36.0)
MCV: 93.1 fL (ref 79.5–101.0)
MONO ABS: 0.3 10*3/uL (ref 0.1–0.9)
MONOS PCT: 7 %
Neutro Abs: 2.8 10*3/uL (ref 1.5–6.5)
Neutrophils Relative %: 67 %
PLATELETS: 173 10*3/uL (ref 145–400)
RBC: 4.29 MIL/uL (ref 3.70–5.45)
RDW: 13.8 % (ref 11.2–14.5)
WBC: 4.2 10*3/uL (ref 3.9–10.3)

## 2018-04-22 LAB — COMPREHENSIVE METABOLIC PANEL
ALBUMIN: 4.2 g/dL (ref 3.5–5.0)
ALK PHOS: 82 U/L (ref 40–150)
ALT: 17 U/L (ref 0–55)
AST: 24 U/L (ref 5–34)
Anion gap: 8 (ref 3–11)
BILIRUBIN TOTAL: 0.6 mg/dL (ref 0.2–1.2)
BUN: 16 mg/dL (ref 7–26)
CALCIUM: 10.3 mg/dL (ref 8.4–10.4)
CO2: 27 mmol/L (ref 22–29)
Chloride: 105 mmol/L (ref 98–109)
Creatinine, Ser: 0.75 mg/dL (ref 0.60–1.10)
GFR calc Af Amer: >60 mL/min (ref >60)
GFR calc non Af Amer: >60 mL/min (ref >60)
GLUCOSE: 84 mg/dL (ref 70–140)
POTASSIUM: 4.5 mmol/L (ref 3.5–5.1)
SODIUM: 140 mmol/L (ref 136–145)
TOTAL PROTEIN: 7.2 g/dL (ref 6.4–8.3)

## 2018-04-22 MED ORDER — TAMOXIFEN CITRATE 20 MG PO TABS: 20.0000 mg | ORAL_TABLET | Freq: Every day | ORAL | 4 refills | Status: DC

## 2018-04-22 NOTE — Progress Notes (Signed)
[place]  [phone] [phone]) [phone]     ID: [id number] COCCIA DOB: Oct 21, 1976  MR#: [id number]  VFI#:[id number]  Patient Care Team: Glenda Chroman, MD as PCP - General (Internal Medicine) Fanny Skates, MD as Consulting Physician (General Surgery) Nikesha Kwasny, Virgie Dad, MD as Consulting Physician (Oncology) Jamey Ripa, PhD (Inactive) as Consulting Physician (Psychology) PCP: Glenda Chroman, MD Freda Munro MD (GYN) Theodoro Kos M.D. (Plastics)  CHIEF COMPLAINT: Estrogen receptor positive breast cancer  CURRENT TREATMENT: Tamoxifen  BREAST CANCER HISTORY: From the original intake note:  Misty [name] was closely followed for some right breast changes between 20 11/12/2012. Everything seemed stable at that time. In 2014 she was promoted in her job, was working very hard, and actually missed having a mammogram. Late in January 2015 she noted some pain in her right breast and by February when she lifted her arms she noted that the nipple was tilting sideways and seemed a little bit of gathered. She didn't think much of this however. It was not until July that she noted a palpable mass in the lateral right breast. She brought this to her gynecologist's attention and she was set up for diagnostic mammography at [facility] 08/04/2014.   There was an area of distortion in the lateral aspect of the right breast noted on mammography,  corresponding to a palpable firm mass at the 8:00 position of the right breast. Ultrasound confirmed a 2.4 cm irregular hypoechoic spiculated mass in the right breast at the 8:00 position and in addition at the 5:00 position 1 cm from the nipple there was a taller than wide irregular hypoechoic mass measuring 0.8 cm. There was no right axillary adenopathy. In the left breast upper-outer quadrant there was a minimally complicated cyst measuring 0.7 cm.  On 08/11/2014 the patient underwent biopsy of the 2 right breast masses noted on  ultrasound. 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 The proliferation fraction by MIB-1 was 17% ([id number]; this report was reviewed by our pathologist, who concurred, [name] [phone]).  On 08/25/2014 the patient underwent bilateral breast MRI at [place]. This showed, in the right breast, an irregular enhancing mass at the 8:00 position measuring 2.7 cm. Also in the right breast at the 6:00 position there was another enhancing mass measuring 0.6 cm, located 2 cm away from the larger mass. There were also masses in the retroareolar position measuring 0.7 cm, in the posterior central right breast measuring 0.5 cm, and another one just inferior to the midline measuring 5.5 cm. There were no masses or findings of concern in the left breast and no abnormal appearing lymph nodes.  The patient's subsequent history is as detailed below  INTERVAL HISTORY: Boneta returns today for follow-up of her estrogen receptor positive breast cancer accompanied by her husband [name].  Poetry is supposed to be on tamoxifen but she takes it very irregularly.  She does not know if she takes it every week even.  When she does take it she does not notice any unusual side effects.  Cost is not an issue.  She just "does not remember to take it".  REVIEW OF SYSTEMS: Adaeze is very concerned about her thyroid problems and is followed for this by Dr. Chalmers Cater.  Kaydince recently had an ultrasound of the thyroid  which shows no findings of concern but does require further follow-up.  Her Synthroid is being adjusted.  She takes her Synthroid on a daily basis and does not forget to take that.  She is currently not exercising, but she is on her feet a lot doing housework and carrying their 44 year old to his various  ballgames and other sports.  A detailed review of systems today was otherwise stable  PAST MEDICAL HISTORY: Past Medical History:  Diagnosis Date  . Anxiety    situational  . Cancer ([place])    breast cancer  . Celiac disease   . Headache(784.0)   . Hypothyroidism   . PONV (postoperative nausea and vomiting)   . Radiation 04/18/15-05/26/15   right breast  . Thyroid disease     PAST SURGICAL HISTORY: Past Surgical History:  Procedure Laterality Date  . CESAREAN SECTION    . CHOLECYSTECTOMY    . GALLBLADDER SURGERY    . LAPAROSCOPIC ASSISTED VAGINAL HYSTERECTOMY N/A 09/27/2015   Procedure: LAPAROSCOPIC ASSISTED VAGINAL HYSTERECTOMY;  Surgeon: Olga Millers, MD;  Location: [facility];  Service: Gynecology;  Laterality: N/A;  . LAPAROSCOPIC BILATERAL SALPINGO OOPHERECTOMY Bilateral 09/27/2015   Procedure: LAPAROSCOPIC BILATERAL SALPINGO OOPHORECTOMY;  Surgeon: Olga Millers, MD;  Location: [facility];  Service: Gynecology;  Laterality: Bilateral;  . MASTECTOMY Right    malignant  . PORT-A-CATH REMOVAL N/A 09/27/2015   Procedure: REMOVAL PORT-A-CATH;  Surgeon: Fanny Skates, MD;  Location: [facility];  Service: General;  Laterality: N/A;  Porta Cath removed intact without defects  . PORTACATH PLACEMENT Right 10/22/2014   Procedure: INSERTION PORT-A-CATH;  Surgeon: Fanny Skates, MD;  Location: [facility];  Service: General;  Laterality: Right;  . SIMPLE MASTECTOMY WITH AXILLARY SENTINEL NODE BIOPSY Right 09/28/2014   Procedure: RIGHT TOTAL MASTECTOMY, RIGHT  AXILLARY SENTINEL NODE BIOPSY;  Surgeon: Fanny Skates, MD;  Location: [facility];  Service: General;  Laterality: Right;    FAMILY HISTORY Family History  Problem Relation Age of Onset  . Skin cancer Father   . Breast cancer Maternal Grandmother        dx unknown age  . Breast cancer Other 1       mat great aunt through [facility] with breast cancer   the patient's parents are both living, in their late 34s. The  patient had one brother, no sisters. The patient's maternal grandmother was diagnosed with breast cancer in her late 17s. One of that grandmother's sisters was also diagnosed with breast cancer, in her 29s. There is no other history of breast or ovarian cancer in the family  GYNECOLOGIC HISTORY:  No LMP recorded. (Menstrual status: Chemotherapy). Menarche age 59, first live birth age 64, the patient is GX P1. 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 Her husband, [name], works as a Engineer, structural in [place]. Their son [name] is 73. The patient attends a local [employer]: Not in place   HEALTH MAINTENANCE: Social History   Tobacco Use  . Smoking status: Never Smoker  Substance Use Topics  . Alcohol use: No    Alcohol/week: 0.0 oz  . Drug use: No     Colonoscopy:  PAP: 07/29/2014  Bone density:  Lipid panel:  Allergies  Allergen Reactions  . Gluten Meal Other (See Comments)    Has celiac's disease  . Penicillins Nausea And Vomiting    Has patient had a PCN reaction causing immediate rash, facial/tongue/throat swelling, SOB or lightheadedness with hypotension: No Has patient had a PCN reaction causing severe rash involving mucus membranes or skin necrosis: No Has patient had a PCN reaction that required hospitalization No Has patient had a PCN reaction occurring within the last 10 years: No If all of the above answers are "NO", then may proceed with Cephalosporin use. Son has had a severe reaction to it    Current Outpatient Medications  Medication Sig Dispense Refill  . levothyroxine (SYNTHROID, LEVOTHROID) 88 MCG tablet Take 88 mcg by mouth daily before breakfast.    . metoprolol tartrate  (LOPRESSOR) 25 MG tablet Take 1 tablet (25 mg total) by mouth 2 (two) times daily. 30 tablet 3  . tamoxifen (NOLVADEX) 20 MG tablet Take 1 tablet (20 mg total) by mouth daily. 90 tablet 4   No current facility-administered medications for this visit.     OBJECTIVE: Middle-aged white woman in no acute distress Vitals:   04/22/18 0953  BP: 103/66  Pulse: 71  Resp: 18  Temp: 98.3 F (36.8 C)  SpO2: 100%     Body mass index is 22.71 kg/m.    ECOG FS:0 - Asymptomatic  Sclerae unicteric, EOMs intact Oropharynx clear and moist No cervical or supraclavicular adenopathy Lungs no rales or rhonchi Heart regular rate and rhythm Abd soft, nontender, positive bowel sounds MSK no focal spinal tenderness, no upper extremity lymphedema Neuro: nonfocal, well oriented, appropriate affect Breasts: Status post right mastectomy, with no evidence of chest wall recurrence.  Left breast is benign.  Both axillae are benign.  LAB RESULTS:  CMP     Component Value Date/Time   NA 140 04/22/2018 0911   NA 143 10/17/2016 0850   K 4.5 04/22/2018 0911   K 4.9 10/17/2016 0850   CL 105 04/22/2018 0911   CO2 27 04/22/2018 0911   CO2 28 10/17/2016 0850   GLUCOSE 84 04/22/2018 0911   GLUCOSE 98 10/17/2016 0850   BUN 16 04/22/2018 0911   BUN 17.0 10/17/2016 0850   CREATININE 0.75 04/22/2018 0911   CREATININE 0.7 10/17/2016 0850   CALCIUM 10.3 04/22/2018 0911   CALCIUM 10.2 10/17/2016 0850   PROT 7.2 04/22/2018 0911   PROT 7.5 10/17/2016 0850   ALBUMIN 4.2 04/22/2018 0911   ALBUMIN 4.0 10/17/2016 0850   AST 24 04/22/2018 0911   AST 21 10/17/2016 0850   ALT 17 04/22/2018 0911   ALT 16 10/17/2016 0850   ALKPHOS 82 04/22/2018 0911   ALKPHOS 107 10/17/2016 0850   BILITOT 0.6 04/22/2018 0911   BILITOT 0.44 10/17/2016 0850   GFRNONAA >60 04/22/2018 0911   GFRAA >60 04/22/2018 0911    I No results found for: SPEP  Lab Results  Component Value Date   WBC 4.2 04/22/2018   NEUTROABS 2.8  04/22/2018   HGB 13.4 04/22/2018   HCT 39.9 04/22/2018   MCV 93.1 04/22/2018   PLT 173 04/22/2018      Chemistry      Component Value Date/Time   NA 140 04/22/2018 0911   NA 143 10/17/2016 0850   K 4.5 04/22/2018 0911   K 4.9 10/17/2016 0850   CL 105 04/22/2018 0911   CO2 27 04/22/2018 0911   CO2 28 10/17/2016 0850   BUN 16 04/22/2018 0911   BUN  17.0 10/17/2016 0850   CREATININE 0.75 04/22/2018 0911   CREATININE 0.7 10/17/2016 0850      Component Value Date/Time   CALCIUM 10.3 04/22/2018 0911   CALCIUM 10.2 10/17/2016 0850   ALKPHOS 82 04/22/2018 0911   ALKPHOS 107 10/17/2016 0850   AST 24 04/22/2018 0911   AST 21 10/17/2016 0850   ALT 17 04/22/2018 0911   ALT 16 10/17/2016 0850   BILITOT 0.6 04/22/2018 0911   BILITOT 0.44 10/17/2016 0850       No results found for: LABCA2  No components found for: LABCA125  No results for input(s): INR in the last 168 hours.  Urinalysis No results found for: COLORURINE  STUDIES: US Thyroid  Result Date: 04/01/2018 CLINICAL DATA:  42 year old female with a history of thyroid goiter EXAM: THYROID ULTRASOUND TECHNIQUE: Ultrasound examination of the thyroid gland and adjacent soft tissues was performed. COMPARISON:  03/27/2017 FINDINGS: Parenchymal Echotexture: Moderately heterogenous Isthmus: 0.2 cm Right lobe: 2.9 cm x 0.9 cm x 0.9 cm Left lobe: 2.9 cm x 0.8 cm x 1.0 cm _________________________________________________________ Estimated total number of nodules >/= 1 cm: 3 Number of spongiform nodules >/=  2 cm not described below (TR1): 0 Number of mixed cystic and solid nodules >/= 1.5 cm not described below ([place]): 0 _________________________________________________________ Nodule # 2: Location: Right; Inferior Maximum size: 1.1 cm; Other 2 dimensions: 0.7 cm x 0.8 cm Composition: solid/almost completely solid (2) Echogenicity: hyperechoic (1) Shape: not taller-than-wide (0) Margins: ill-defined (0) Echogenic foci: macrocalcifications  (1) ACR TI-RADS total points: 4. ACR TI-RADS risk category: TR4 (4-6 points). ACR TI-RADS recommendations: Nodule meets criteria for surveillance _________________________________________________________ Nodule # 3: Location: Left; Mid Maximum size: 1.0 cm; Other 2 dimensions: 0.6 cm x 0.3 cm. (Nodule is decreased in size from the prior of maximum 1.3 cm) Composition: solid/almost completely solid (2) Echogenicity: isoechoic (1) Shape: taller-than-wide (3) Margins: ill-defined (0) Echogenic foci: macrocalcifications (1) ACR TI-RADS total points: 4. ACR TI-RADS risk category: TR4 (4-6 points). ACR TI-RADS recommendations: Although meeting criteria for biopsy, nodule measures smaller than the comparison _________________________________________________________ Nodule # 4: Location: Left; Inferior Maximum size: 1.1 cm; Other 2 dimensions: 0.6 cm x 0.5 cm Composition: solid/almost completely solid (2) Echogenicity: isoechoic (1) Shape: not taller-than-wide (0) Margins: ill-defined (0) Echogenic foci: macrocalcifications (1) ACR TI-RADS total points: 4. ACR TI-RADS risk category: TR4 (4-6 points). ACR TI-RADS recommendations: Nodule meets criteria for surveillance _________________________________________________________ No adenopathy IMPRESSION: Multinodular thyroid again demonstrated. Nodule in the most suspicious category by the TIRADS criteria (left labeled 3) has decreased in size since the prior survey, which indicates benign entity. Continued surveillance may be considered. Right lower and left lower thyroid nodules (labeled 2 and 4) meet criteria for surveillance, as designated by the newly established ACR TI-RADS criteria. Surveillance ultrasound study recommended to be performed annually up to 5 years beyond the baseline study of 03/27/2017. Recommendations follow those established by the new ACR TI-RADS criteria (J Am Coll Radiol 6767;20:947-096). Electronically Signed   By: Corrie Mckusick D.O.   On: 04/01/2018  07:54      ASSESSMENT: 42 y.o. BRCA negative [place], [place] woman status post biopsy of 2 separate right breast lower outer quadrant masses 08/11/2014, both positive for an invasive ductal carcinoma, grade 1, estrogen and progesterone receptor strongly positive, with an MIB-1 of 19%, and HER-2 nonamplified  (1) genetics testing sent 08/26/2014, showing no mutations in the BRCA genes; there were also no mutations in ATM, BARD 1, BRIP1, CDH 1, CHE K2,  MR E11A, M UT YH, N BN, NF1, PA L B2, PTE N, RAD 50, RAD 50 1C, RAD 50 1D, and T p53.  (2) status post right mastectomy with sentinel lymph node sampling 09/28/2014 for an mpT2 pN1, stage IIB invasive breast cancer (with both ductal and lobular features), grade 2, with negative margins  (3) adjuvant chemotherapy consisted of doxorubicin and cyclophosphamide in dose dense fashion 4, completed 12/30/2014, followed by paclitaxel weekly x 12 started 01/21/2015.  (a) paclitaxel stopped after 5 treatments due to persistent neuropathy, last dose 02/18/2015  (4) adjuvant radiation given 04/18/2015-05/26/2015: Right chest wall/50.4 [place] Cables @ 1.8 Pearline Cables per fraction x 28 fractions Right supraclavicular fossa/PAB 45 Gy @1 .8 Gy per fraction x 25 fractions Right scar boost / 10 [place] at [employer] per fraction x 5 fractions  (5) started tamoxifen 07/07/2015  (a) s/p TAH-BSO 09/27/2015 with benign pathology  (6) history of celiac disease  (7) neuro psychologic testing 08/29/2016 identified deficiencies on immediate auditory verbal memory span and verbal learning capacity, within the borderline impaired and low average ranges. 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 Anxiety and stress are seen as contributing to a worsening of her cognitive functioning but not as causing her cognitive difficulties. It is not known how long her cognitive difficulties might persist."  PLAN: Marua is now 3 and half years  out from definitive surgery for her breast cancer with no evidence of disease recurrence.  This is very favorable.  We discussed her ultrasound of the thyroid in detail.  She understands the lesion that could have been biopsied last year but was observed instead, is now smaller, indicating that it is benign.  She understands she does not need a biopsy at this point but does need to repeat ultrasound which is being arranged for on a yearly basis by her endocrinologist.  We reviewed her pathology which showed positive lymph nodes in the armpit.  This means she is at higher risk than other women might be who did not have that problem.  We reviewed the fact that tamoxifen cuts that risk in half.  I strongly urged her to take it on a daily basis.  She can take it at the same time as her Synthroid since it should not interfere with the absorption of that drug.  She will have her next mammogram in December.  She will see me again in a year.  She knows to call for any other problems that may develop before that visit.   Chauncey Cruel, MD   04/22/2018 10:26 AM

## 2018-04-22 NOTE — Progress Notes (Signed)
[place]  [phone] [phone]) [phone]     ID: Misty [name] DOB: [birth year]  MR#: [id number]  ITG#:[id number]  Patient Care Team: Glenda Chroman, MD as PCP - General (Internal Medicine) Fanny Skates, MD as Consulting Physician (General Surgery) Aaira Oestreicher, Virgie Dad, MD as Consulting Physician (Oncology) Jamey Ripa, PhD (Inactive) as Consulting Physician (Psychology) PCP: Glenda Chroman, MD Freda Munro MD (GYN) Theodoro Kos M.D. (Plastics)  CHIEF COMPLAINT: Estrogen receptor positive breast cancer  CURRENT TREATMENT: Tamoxifen  BREAST CANCER HISTORY: From the original intake note:  Misty [name] was closely followed for some right breast changes between 20 11/12/2012. Everything seemed stable at that time. In 2014 she was promoted in her job, was working very hard, and actually missed having a mammogram. Late in January 2015 she noted some pain in her right breast and by February when she lifted her arms she noted that the nipple was tilting sideways and seemed a little bit of gathered. She didn't think much of this however. It was not until July that she noted a palpable mass in the lateral right breast. She brought this to her gynecologist's attention and she was set up for diagnostic mammography at [facility] 08/04/2014.   There was an area of distortion in the lateral aspect of the right breast noted on mammography,  corresponding to a palpable firm mass at the 8:00 position of the right breast. Ultrasound confirmed a 2.4 cm irregular hypoechoic spiculated mass in the right breast at the 8:00 position and in addition at the 5:00 position 1 cm from the nipple there was a taller than wide irregular hypoechoic mass measuring 0.8 cm. There was no right axillary adenopathy. In the left breast upper-outer quadrant there was a minimally complicated cyst measuring 0.7 cm.  On 08/11/2014 the patient underwent biopsy of the 2 right breast masses noted on  ultrasound. 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 The proliferation fraction by MIB-1 was 17% ([id number]; this report was reviewed by our pathologist, who concurred, [name] [phone]).  On 08/25/2014 the patient underwent bilateral breast MRI at [facility]. This showed, in the right breast, an irregular enhancing mass at the 8:00 position measuring 2.7 cm. Also in the right breast at the 6:00 position there was another enhancing mass measuring 0.6 cm, located 2 cm away from the larger mass. There were also masses in the retroareolar position measuring 0.7 cm, in the posterior central right breast measuring 0.5 cm, and another one just inferior to the midline measuring 5.5 cm. There were no masses or findings of concern in the left breast and no abnormal appearing lymph nodes.  The patient's subsequent history is as detailed below  INTERVAL HISTORY: Misty [name] returns today for follow-up and treatment of her estrogen receptor positive breast cancer, accompanied by her husband.  There is a tells me that she is taking tamoxifen very irregularly.  She is not sure that she takes it even once a week.  When she does take it she is not aware of any significant side effects such as hot flashes or vaginal wetness.  It is not a cost issue.  She just "does not remember to take it".   REVIEW OF SYSTEMS: Misty [name] is very concerned about her thyroid issues.  She just had a thyroid  ultrasound which is discussed below.  She takes her Synthroid daily.  She never misses a dose.  She is not exercising right now although she is on her feet a lot doing housework and errands and varying her 38 year old son to his multiple sports activities.  Aside from these issues a detailed review of systems today was  stable  PAST MEDICAL HISTORY: Past Medical History:  Diagnosis Date  . Anxiety    situational  . Cancer ([place])    breast cancer  . Celiac disease   . Headache(784.0)   . Hypothyroidism   . PONV (postoperative nausea and vomiting)   . Radiation 04/18/15-05/26/15   right breast  . Thyroid disease     PAST SURGICAL HISTORY: Past Surgical History:  Procedure Laterality Date  . CESAREAN SECTION    . CHOLECYSTECTOMY    . GALLBLADDER SURGERY    . LAPAROSCOPIC ASSISTED VAGINAL HYSTERECTOMY N/A 09/27/2015   Procedure: LAPAROSCOPIC ASSISTED VAGINAL HYSTERECTOMY;  Surgeon: Olga Millers, MD;  Location: [facility];  Service: Gynecology;  Laterality: N/A;  . LAPAROSCOPIC BILATERAL SALPINGO OOPHERECTOMY Bilateral 09/27/2015   Procedure: LAPAROSCOPIC BILATERAL SALPINGO OOPHORECTOMY;  Surgeon: Olga Millers, MD;  Location: [facility];  Service: Gynecology;  Laterality: Bilateral;  . MASTECTOMY Right    malignant  . PORT-A-CATH REMOVAL N/A 09/27/2015   Procedure: REMOVAL PORT-A-CATH;  Surgeon: Fanny Skates, MD;  Location: [facility];  Service: General;  Laterality: N/A;  Porta Cath removed intact without defects  . PORTACATH PLACEMENT Right 10/22/2014   Procedure: INSERTION PORT-A-CATH;  Surgeon: Fanny Skates, MD;  Location: [facility];  Service: General;  Laterality: Right;  . SIMPLE MASTECTOMY WITH AXILLARY SENTINEL NODE BIOPSY Right 09/28/2014   Procedure: RIGHT TOTAL MASTECTOMY, RIGHT  AXILLARY SENTINEL NODE BIOPSY;  Surgeon: Fanny Skates, MD;  Location: [facility];  Service: General;  Laterality: Right;    FAMILY HISTORY Family History  Problem Relation Age of Onset  . Skin cancer Father   . Breast cancer Maternal Grandmother        dx unknown age  . Breast cancer Other 23       mat great aunt through [facility] with breast cancer   the patient's parents are both living, in their late 25s. The patient had one brother, no sisters. The patient's maternal grandmother was  diagnosed with breast cancer in her late 21s. One of that grandmother's sisters was also diagnosed with breast cancer, in her 71s. There is no other history of breast or ovarian cancer in the family  GYNECOLOGIC HISTORY:  No LMP recorded. (Menstrual status: Chemotherapy). Menarche age 50, first live birth age 29, the patient is GX P1. 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 Her husband, Herbie Baltimore "[name]" Hindy Perrault Junior, worked as a Engineer, structural in [place] but he retired in 2016 and now runs a Building services engineer. Their son [name] is 62 and very active in sports. The patient attends a local [employer]: Not in place   HEALTH MAINTENANCE: Social History   Tobacco Use  . Smoking status: Never Smoker  Substance Use Topics  . Alcohol use: No    Alcohol/week: 0.0 oz  . Drug use: No  Colonoscopy:  PAP: 07/29/2014  Bone density:  Lipid panel:  Allergies  Allergen Reactions  . Gluten Meal Other (See Comments)    Has celiac's disease  . Penicillins Nausea And Vomiting    Has patient had a PCN reaction causing immediate rash, facial/tongue/throat swelling, SOB or lightheadedness with hypotension: No Has patient had a PCN reaction causing severe rash involving mucus membranes or skin necrosis: No Has patient had a PCN reaction that required hospitalization No Has patient had a PCN reaction occurring within the last 10 years: No If all of the above answers are "NO", then may proceed with Cephalosporin use. Son has had a severe reaction to it    Current Outpatient Medications  Medication Sig Dispense Refill  . levothyroxine (SYNTHROID, LEVOTHROID) 88 MCG tablet Take 88  mcg by mouth daily before breakfast.    . metoprolol tartrate (LOPRESSOR) 25 MG tablet Take 1 tablet (25 mg total) by mouth 2 (two) times daily. 30 tablet 3  . tamoxifen (NOLVADEX) 20 MG tablet Take 1 tablet (20 mg total) by mouth daily. 90 tablet 4   No current facility-administered medications for this visit.     OBJECTIVE: Middle-aged white woman who appears well  Vitals:   04/22/18 0953  BP: 103/66  Pulse: 71  Resp: 18  Temp: 98.3 F (36.8 C)  SpO2: 100%     Body mass index is 22.71 kg/m.    ECOG FS:0 - Asymptomatic  Sclerae unicteric, pupils round and equal Oropharynx clear and moist No cervical or supraclavicular adenopathy Lungs no rales or rhonchi Heart regular rate and rhythm Abd soft, nontender including deep palpation of the left lower quadrant where she tells me during the exam she occasionally has some discomfort, positive bowel sounds, no masses palpated MSK no focal spinal tenderness, no upper extremity lymphedema Neuro: nonfocal, well oriented, appropriate affect Breasts: On the right she is status post mastectomy and radiation.  There is no evidence of local recurrence.  On the left breast is benign.  Both axillae are benign.  LAB RESULTS:  CMP     Component Value Date/Time   NA 140 04/22/2018 0911   NA 143 10/17/2016 0850   K 4.5 04/22/2018 0911   K 4.9 10/17/2016 0850   CL 105 04/22/2018 0911   CO2 27 04/22/2018 0911   CO2 28 10/17/2016 0850   GLUCOSE 84 04/22/2018 0911   GLUCOSE 98 10/17/2016 0850   BUN 16 04/22/2018 0911   BUN 17.0 10/17/2016 0850   CREATININE 0.75 04/22/2018 0911   CREATININE 0.7 10/17/2016 0850   CALCIUM 10.3 04/22/2018 0911   CALCIUM 10.2 10/17/2016 0850   PROT 7.2 04/22/2018 0911   PROT 7.5 10/17/2016 0850   ALBUMIN 4.2 04/22/2018 0911   ALBUMIN 4.0 10/17/2016 0850   AST 24 04/22/2018 0911   AST 21 10/17/2016 0850   ALT 17 04/22/2018 0911   ALT 16 10/17/2016 0850   ALKPHOS 82 04/22/2018 0911   ALKPHOS 107 10/17/2016  0850   BILITOT 0.6 04/22/2018 0911   BILITOT 0.44 10/17/2016 0850   GFRNONAA >60 04/22/2018 0911   GFRAA >60 04/22/2018 0911    I No results found for: SPEP  Lab Results  Component Value Date   WBC 4.2 04/22/2018   NEUTROABS 2.8 04/22/2018   HGB 13.4 04/22/2018   HCT 39.9 04/22/2018   MCV 93.1 04/22/2018   PLT 173 04/22/2018      Chemistry      Component Value Date/Time   NA 140  04/22/2018 0911   NA 143 10/17/2016 0850   K 4.5 04/22/2018 0911   K 4.9 10/17/2016 0850   CL 105 04/22/2018 0911   CO2 27 04/22/2018 0911   CO2 28 10/17/2016 0850   BUN 16 04/22/2018 0911   BUN 17.0 10/17/2016 0850   CREATININE 0.75 04/22/2018 0911   CREATININE 0.7 10/17/2016 0850      Component Value Date/Time   CALCIUM 10.3 04/22/2018 0911   CALCIUM 10.2 10/17/2016 0850   ALKPHOS 82 04/22/2018 0911   ALKPHOS 107 10/17/2016 0850   AST 24 04/22/2018 0911   AST 21 10/17/2016 0850   ALT 17 04/22/2018 0911   ALT 16 10/17/2016 0850   BILITOT 0.6 04/22/2018 0911   BILITOT 0.44 10/17/2016 0850       No results found for: LABCA2  No components found for: [id number]  No results for input(s): INR in the last 168 hours.  Urinalysis No results found for: COLORURINE  STUDIES: US Thyroid  Result Date: 04/01/2018 CLINICAL DATA:  42 year old female with a history of thyroid goiter EXAM: THYROID ULTRASOUND TECHNIQUE: Ultrasound examination of the thyroid gland and adjacent soft tissues was performed. COMPARISON:  03/27/2017 FINDINGS: Parenchymal Echotexture: Moderately heterogenous Isthmus: 0.2 cm Right lobe: 2.9 cm x 0.9 cm x 0.9 cm Left lobe: 2.9 cm x 0.8 cm x 1.0 cm _________________________________________________________ Estimated total number of nodules >/= 1 cm: 3 Number of spongiform nodules >/=  2 cm not described below (TR1): 0 Number of mixed cystic and solid nodules >/= 1.5 cm not described below ([place]): 0 _________________________________________________________ Nodule # 2: Location:  Right; Inferior Maximum size: 1.1 cm; Other 2 dimensions: 0.7 cm x 0.8 cm Composition: solid/almost completely solid (2) Echogenicity: hyperechoic (1) Shape: not taller-than-wide (0) Margins: ill-defined (0) Echogenic foci: macrocalcifications (1) ACR TI-RADS total points: 4. ACR TI-RADS risk category: TR4 (4-6 points). ACR TI-RADS recommendations: Nodule meets criteria for surveillance _________________________________________________________ Nodule # 3: Location: Left; Mid Maximum size: 1.0 cm; Other 2 dimensions: 0.6 cm x 0.3 cm. (Nodule is decreased in size from the prior of maximum 1.3 cm) Composition: solid/almost completely solid (2) Echogenicity: isoechoic (1) Shape: taller-than-wide (3) Margins: ill-defined (0) Echogenic foci: macrocalcifications (1) ACR TI-RADS total points: 4. ACR TI-RADS risk category: TR4 (4-6 points). ACR TI-RADS recommendations: Although meeting criteria for biopsy, nodule measures smaller than the comparison _________________________________________________________ Nodule # 4: Location: Left; Inferior Maximum size: 1.1 cm; Other 2 dimensions: 0.6 cm x 0.5 cm Composition: solid/almost completely solid (2) Echogenicity: isoechoic (1) Shape: not taller-than-wide (0) Margins: ill-defined (0) Echogenic foci: macrocalcifications (1) ACR TI-RADS total points: 4. ACR TI-RADS risk category: TR4 (4-6 points). ACR TI-RADS recommendations: Nodule meets criteria for surveillance _________________________________________________________ No adenopathy IMPRESSION: Multinodular thyroid again demonstrated. Nodule in the most suspicious category by the TIRADS criteria (left labeled 3) has decreased in size since the prior survey, which indicates benign entity. Continued surveillance may be considered. Right lower and left lower thyroid nodules (labeled 2 and 4) meet criteria for surveillance, as designated by the newly established ACR TI-RADS criteria. Surveillance ultrasound study recommended to be  performed annually up to 5 years beyond the baseline study of 03/27/2017. Recommendations follow those established by the new ACR TI-RADS criteria (J Am Coll Radiol 2836;62:947-654). Electronically Signed   By: Corrie Mckusick D.O.   On: 04/01/2018 07:54    Mammography December 2017 with left breast ultrasonography showed the breast density to be category C.  ASSESSMENT: 42 y.o. BRCA negative [place], [place] woman status post  biopsy of 2 separate right breast lower outer quadrant masses 08/11/2014, both positive for an invasive ductal carcinoma, grade 1, estrogen and progesterone receptor strongly positive, with an MIB-1 of 19%, and HER-2 nonamplified  (1) genetics testing sent 08/26/2014, showing no mutations in the BRCA genes; there were also no mutations in ATM, BARD 1, BRIP1, [place] 1, CHE K2, MR E11A, M UT YH, N BN, NF1, PA L B2, PTE N, RAD 50, RAD 50 1C, RAD 50 1D, and T p53.  (2) status post right mastectomy with sentinel lymph node sampling 09/28/2014 for an mpT2 pN1, stage IIB invasive breast cancer (with both ductal and lobular features), grade 2, with negative margins  (3) adjuvant chemotherapy consisted of doxorubicin and cyclophosphamide in dose dense fashion 4, completed 12/30/2014, followed by paclitaxel weekly x 12 started 01/21/2015.  (a) paclitaxel stopped after 5 treatments due to persistent neuropathy, last dose 02/18/2015  (4) adjuvant radiation given 04/18/2015-05/26/2015: Right chest wall/50.4 [place] Cables @ 1.8 Pear[place] Cables per fraction x 28 fractions Right supraclavicular fossa/PAB 45 Gy @1 .8 Gy per fraction x 25 fractions Right scar boost / 10 [place] at [employer] per fraction x 5 fractions  (5) started tamoxifen 07/07/2015--taken very irregularly  (a) s/p TAH-BSO 09/27/2015 with benign pathology  (6) history of celiac disease  (7) neuro psychologic testing 08/29/2016 identified deficiencies on immediate auditory verbal memory span and verbal learning capacity, within the borderline impaired and  low average ranges. 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 Anxiety and stress are seen as contributing to a worsening of her cognitive functioning but not as causing her cognitive difficulties. It is not known how long her cognitive difficulties might persist."  (8) Empty sella by brain MRI 10/24/2016  PLAN: Lavell is now 3-1/2 years out from definitive surgery for her breast cancer with no evidence of disease recurrence.  This is very favorable.  We went over her pathology which showed 2+ lymph nodes.  She understands she is at higher risk of recurrence in someone who did not have adenopathy.  She is able to take her Synthroid on a daily basis.  She can take her tamoxifen at the same time as her Synthroid as there should be no interaction between those 2 drugs and the tamoxifen should not affect her Synthroid absorption.  We reviewed the ultrasound of her thyroid.  1 of her nodules could have been biopsied last year but we opted for observation.  That nodule is now smaller indicating that it is benign.  We reviewed the fact that thyroid cancer it tends to be very slow and that observation frequently allows the patient to be spared biopsies as indeed was the case here.  Otherwise she will return to see me in 1 year.  She knows to call for any problems that may develop before then. Chauncey Cruel, MD   04/22/2018 10:08 AM

## 2018-04-22 NOTE — Telephone Encounter (Signed)
Gave patient AVs and calendar of upcoming April 2020 appointments.

## 2018-08-29 ENCOUNTER — Encounter: Payer: Self-pay | Admitting: *Deleted

## 2018-12-22 ENCOUNTER — Other Ambulatory Visit: Payer: Self-pay | Admitting: Oncology

## 2018-12-22 DIAGNOSIS — Z1231 Encounter for screening mammogram for malignant neoplasm of breast: Secondary | ICD-10-CM

## 2018-12-23 ENCOUNTER — Other Ambulatory Visit: Payer: Self-pay | Admitting: Oncology

## 2018-12-23 DIAGNOSIS — R0789 Other chest pain: Secondary | ICD-10-CM

## 2018-12-23 DIAGNOSIS — Z853 Personal history of malignant neoplasm of breast: Secondary | ICD-10-CM

## 2019-01-02 ENCOUNTER — Ambulatory Visit
Admission: RE | Admit: 2019-01-02 | Discharge: 2019-01-02 | Disposition: A | Discharge status: Home / Self Care | Payer: Commercial Managed Care - PPO | Source: Ambulatory Visit | Attending: Oncology | Admitting: Oncology

## 2019-01-02 DIAGNOSIS — R0789 Other chest pain: Secondary | ICD-10-CM

## 2019-01-02 DIAGNOSIS — Z853 Personal history of malignant neoplasm of breast: Secondary | ICD-10-CM

## 2019-03-31 ENCOUNTER — Other Ambulatory Visit: Payer: Self-pay | Admitting: Oncology

## 2019-04-17 ENCOUNTER — Other Ambulatory Visit: Payer: Self-pay | Admitting: Oncology

## 2019-04-24 ENCOUNTER — Telehealth: Payer: Self-pay | Admitting: Oncology

## 2019-04-24 NOTE — Telephone Encounter (Signed)
Called regarding upcoming [facility] appointment, screen test complete and e-mail has been sent.

## 2019-04-27 NOTE — Progress Notes (Signed)
[place]  Telephone:([phone]) [phone] Fax:([phone]) [phone]     ID: Misty [name] DOB: July 31, 1976  MR#: [id number]  BZJ#:[id number]  Patient Care Team: Glenda Chroman, MD as PCP - General (Internal Medicine) Fanny Skates, MD as Consulting Physician (General Surgery) Magrinat, Virgie Dad, MD as Consulting Physician (Oncology) Jamey Ripa, PhD (Inactive) as Consulting Physician (Psychology) Juanita Craver, MD as Consulting Physician (Gastroenterology) Jacelyn Pi, MD as Referring Physician (Endocrinology) PCP: Glenda Chroman, MD Freda Munro MD (GYN) Theodoro Kos M.D. (Plastics)   I connected with Misty [name] on 04/28/19 at 10:00 AM EDT by video enabled telemedicine visit and verified that I am speaking with the correct person using two identifiers.   I discussed the limitations, risks, security and privacy concerns of performing an evaluation and management service by telemedicine and the availability of in-person appointments. I also discussed with the patient that there may be a patient responsible charge related to this service. The patient expressed understanding and agreed to proceed.   Other persons participating in the visit and their role in the encounter: husband Bobby; Wilburn Mylar, scribe   Patient's location: home  Provider's location: No Name: Estrogen receptor positive breast cancer  CURRENT TREATMENT: Tamoxifen   INTERVAL HISTORY: Misty [name] returns today for follow-up and treatment of her estrogen receptor positive breast cancer.  She continues on tamoxifen. She reports taking tamofixen when she remembers to take it. She notes it "messes with my stomach if I take it in the morning."  She is now back to taking it at that time.  Since her last visit, she underwent left breast diagnostic mammogram with right breast (chest wall) and axilla ultrasound on 01/02/2019. She presented with complaints of  worsening shooting pain in the lateral right chest wall. The scans showed: breast density category C; no mammographic evidence of malignancy on the left; no suspicious sonographic findings at the site of the patient's right chest wall pain. Physical exam also showed no focal or suspicious findings in the lateral right chest wall or mastectomy bed.   REVIEW OF SYSTEMS: [name] reports continued headaches, flank pain, worsening memory, and "knots" under her skin on her hands and her arms which don't itch. She reports itching to her mastectomy site and back. She takes tylenol for her headaches, and she notes Dr. Collene Mares has told her not to take Aleve. She walks around her neighborhood for exercise, but she cannot go too far due to her fatigue. Her husband is a retired Engineer, structural, so he is not currently working. She cares for her 40 year old son. She and her husband use [employer], and the employees put her groceries in her car for her. 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   The patient denies unusual headaches, visual changes, nausea, vomiting, stiff neck, dizziness, or gait imbalance. There has been no cough, phlegm production, or pleurisy, no chest pain or pressure, and no change in bowel or bladder habits. The patient denies fever, rash, bleeding, unexplained fatigue or unexplained weight loss. A detailed review of systems was otherwise entirely negative.   BREAST CANCER HISTORY: From the original intake note:  Misty [name] was closely followed for some right breast changes between 20 11/12/2012. Everything seemed stable at that time. In 2014 she was promoted in her job, was working very hard, and actually missed having a mammogram. Late in January  2015 she noted some pain in her right breast and by February when she lifted her arms she noted that the nipple was tilting sideways and seemed a little bit of gathered. She didn't  think much of this however. It was not until July that she noted a palpable mass in the lateral right breast. She brought this to her gynecologist's attention and she was set up for diagnostic mammography at [facility] 08/04/2014.   There was an area of distortion in the lateral aspect of the right breast noted on mammography,  corresponding to a palpable firm mass at the 8:00 position of the right breast. Ultrasound confirmed a 2.4 cm irregular hypoechoic spiculated mass in the right breast at the 8:00 position and in addition at the 5:00 position 1 cm from the nipple there was a taller than wide irregular hypoechoic mass measuring 0.8 cm. There was no right axillary adenopathy. In the left breast upper-outer quadrant there was a minimally complicated cyst measuring 0.7 cm.  On 08/11/2014 the patient underwent biopsy of the 2 right breast masses noted on ultrasound. 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 The proliferation fraction by MIB-1 was 17% ([id number]; this report was reviewed by our pathologist, who concurred, [name] [phone]).  On 08/25/2014 the patient underwent bilateral breast MRI at [facility]. This showed, in the right breast, an irregular enhancing mass at the 8:00 position measuring 2.7 cm. Also in the right breast at the 6:00 position there was another enhancing mass measuring 0.6 cm, located 2 cm away from the larger mass. There were also masses in the retroareolar position measuring 0.7 cm, in the posterior central right breast measuring 0.5 cm, and another one just inferior to the midline measuring 5.5 cm. There were no masses or findings of concern in the left breast and no abnormal appearing lymph nodes.  The patient's subsequent history is as detailed  below.   PAST MEDICAL HISTORY: Past Medical History:  Diagnosis Date  . Anxiety    situational  . Cancer ([place])    breast cancer  . Celiac disease   . Headache(784.0)   . Hypothyroidism   . PONV (postoperative nausea and vomiting)   . Radiation 04/18/15-05/26/15   right breast  . Thyroid disease     PAST SURGICAL HISTORY: Past Surgical History:  Procedure Laterality Date  . CESAREAN SECTION    . CHOLECYSTECTOMY    . GALLBLADDER SURGERY    . LAPAROSCOPIC ASSISTED VAGINAL HYSTERECTOMY N/A 09/27/2015   Procedure: LAPAROSCOPIC ASSISTED VAGINAL HYSTERECTOMY;  Surgeon: Olga Millers, MD;  Location: [facility];  Service: Gynecology;  Laterality: N/A;  . LAPAROSCOPIC BILATERAL SALPINGO OOPHERECTOMY Bilateral 09/27/2015   Procedure: LAPAROSCOPIC BILATERAL SALPINGO OOPHORECTOMY;  Surgeon: Olga Millers, MD;  Location: [facility];  Service: Gynecology;  Laterality: Bilateral;  . MASTECTOMY Right    malignant  . PORT-A-CATH REMOVAL N/A 09/27/2015   Procedure: REMOVAL PORT-A-CATH;  Surgeon: Fanny Skates, MD;  Location: [facility];  Service: General;  Laterality: N/A;  Porta Cath removed intact without defects  . PORTACATH PLACEMENT Right 10/22/2014   Procedure: INSERTION PORT-A-CATH;  Surgeon: Fanny Skates, MD;  Location: [facility];  Service: General;  Laterality: Right;  . SIMPLE MASTECTOMY WITH AXILLARY SENTINEL NODE BIOPSY Right 09/28/2014   Procedure: RIGHT TOTAL MASTECTOMY, RIGHT  AXILLARY SENTINEL NODE BIOPSY;  Surgeon: Fanny Skates, MD;  Location: [facility];  Service: General;  Laterality: Right;    FAMILY HISTORY Family History  Problem Relation Age of Onset  . Skin cancer Father   . Breast cancer Maternal Grandmother        dx unknown age  . Breast cancer Other 61       mat great aunt through [facility] with breast cancer   the patient's parents are both living, in their late 39s. The patient had one brother, no sisters. The patient's maternal grandmother was  diagnosed with breast cancer in her late 28s. One of that grandmother's sisters was also diagnosed with breast cancer, in her 31s. There is no other history of breast or ovarian cancer in the family  GYNECOLOGIC HISTORY:  Patient's last menstrual period was 09/21/2015. Menarche age 34, first live birth age 58, the patient is GX P1. She was still having regular periods at the start of chemotherapy, but this stopped early December 2015. She took oral contraceptives for approximately 9 years with no complications  SOCIAL HISTORY: This is updated 04/28/2019 Misty [name] worked as a Librarian, academic in the collections section of the [employer] but she is on disability with the state. Her husband, Herbie [place] "[name]" Lugenia Assefa Junior, worked as a Engineer, structural in [place] but he retired in 2016 and now runs a Building services engineer. Their son [name] is 25 and very active in sports. The patient attends a local [employer]: Not in place   HEALTH MAINTENANCE: Social History   Tobacco Use  . Smoking status: Never Smoker  Substance Use Topics  . Alcohol use: No    Alcohol/week: 0.0 standard drinks  . Drug use: No     Colonoscopy:  PAP: 07/29/2014  Bone density:  Lipid panel:  Allergies  Allergen Reactions  . Gluten Meal Other (See Comments)    Has celiac's disease  . Penicillins Nausea And Vomiting    Has patient had a PCN reaction causing immediate rash, facial/tongue/throat swelling, SOB or lightheadedness with hypotension: No Has patient had a PCN reaction causing severe rash involving mucus membranes or skin necrosis: No Has patient had a PCN reaction that required hospitalization No Has patient had a PCN reaction occurring within the last 10 years: No If all of the above answers are "NO", then may proceed with Cephalosporin use. Son has had a severe reaction to it    Current Outpatient Medications  Medication Sig Dispense Refill  . levothyroxine (SYNTHROID, LEVOTHROID)  88 MCG tablet Take 88 mcg by mouth daily before breakfast.    . metoprolol tartrate (LOPRESSOR) 25 MG tablet Take 1 tablet (25 mg total) by mouth 2 (two) times daily. 30 tablet 3  . tamoxifen (NOLVADEX) 20 MG tablet Take 1 tablet (20 mg total) by mouth daily. 90 tablet 4   No current facility-administered medications for this visit.     OBJECTIVE: Middle-aged white woman who appears stated age  There were no vitals filed for this visit.   There is no height or weight on file to calculate BMI.    ECOG FS:2 - Symptomatic, <50% confined to bed  LAB RESULTS:  CMP     Component Value Date/Time   NA 140 04/22/2018 0911   NA 143 10/17/2016 0850   K 4.5 04/22/2018 0911   K 4.9 10/17/2016 0850   CL 105 04/22/2018 0911   CO2 27 04/22/2018 0911   CO2 28 10/17/2016  0850   GLUCOSE 84 04/22/2018 0911   GLUCOSE 98 10/17/2016 0850   BUN 16 04/22/2018 0911   BUN 17.0 10/17/2016 0850   CREATININE 0.75 04/22/2018 0911   CREATININE 0.7 10/17/2016 0850   CALCIUM 10.3 04/22/2018 0911   CALCIUM 10.2 10/17/2016 0850   PROT 7.2 04/22/2018 0911   PROT 7.5 10/17/2016 0850   ALBUMIN 4.2 04/22/2018 0911   ALBUMIN 4.0 10/17/2016 0850   AST 24 04/22/2018 0911   AST 21 10/17/2016 0850   ALT 17 04/22/2018 0911   ALT 16 10/17/2016 0850   ALKPHOS 82 04/22/2018 0911   ALKPHOS 107 10/17/2016 0850   BILITOT 0.6 04/22/2018 0911   BILITOT 0.44 10/17/2016 0850   GFRNONAA >60 04/22/2018 0911   GFRAA >60 04/22/2018 0911    I No results found for: SPEP  Lab Results  Component Value Date   WBC 4.2 04/22/2018   NEUTROABS 2.8 04/22/2018   HGB 13.4 04/22/2018   HCT 39.9 04/22/2018   MCV 93.1 04/22/2018   PLT 173 04/22/2018      Chemistry      Component Value Date/Time   NA 140 04/22/2018 0911   NA 143 10/17/2016 0850   K 4.5 04/22/2018 0911   K 4.9 10/17/2016 0850   CL 105 04/22/2018 0911   CO2 27 04/22/2018 0911   CO2 28 10/17/2016 0850   BUN 16 04/22/2018 0911   BUN 17.0 10/17/2016 0850    CREATININE 0.75 04/22/2018 0911   CREATININE 0.7 10/17/2016 0850      Component Value Date/Time   CALCIUM 10.3 04/22/2018 0911   CALCIUM 10.2 10/17/2016 0850   ALKPHOS 82 04/22/2018 0911   ALKPHOS 107 10/17/2016 0850   AST 24 04/22/2018 0911   AST 21 10/17/2016 0850   ALT 17 04/22/2018 0911   ALT 16 10/17/2016 0850   BILITOT 0.6 04/22/2018 0911   BILITOT 0.44 10/17/2016 0850       No results found for: LABCA2  No components found for: [id number]  No results for input(s): INR in the last 168 hours.  Urinalysis No results found for: COLORURINE  STUDIES: No results found.  Mammography December 2017 with left breast ultrasonography showed the breast density to be category C.  ASSESSMENT: 43 y.o. BRCA negative [place], [place] woman status post biopsy of 2 separate right breast lower outer quadrant masses 08/11/2014, both positive for an invasive ductal carcinoma, grade 1, estrogen and progesterone receptor strongly positive, with an MIB-1 of 19%, and HER-2 nonamplified  (1) genetics testing sent 08/26/2014, showing no deleterious mutations in the BRCA genes; there were also no mutations in ATM, BARD 1, BRIP1, [place] 1, CHE K2, MR E11A, M UT YH, N BN, NF1, PA L B2, PTE N, RAD 50, RAD 50 1C, RAD 50 1D, and T p53.  (2) status post right mastectomy with sentinel lymph node sampling 09/28/2014 for an mpT2 pN1, stage IIB invasive breast cancer (with both ductal and lobular features), grade 2, with negative margins  (3) adjuvant chemotherapy consisted of doxorubicin and cyclophosphamide in dose dense fashion 4, completed 12/30/2014, followed by paclitaxel weekly x 12 started 01/21/2015.  (a) paclitaxel stopped after 5 treatments due to persistent neuropathy, last dose 02/18/2015  (4) adjuvant radiation given 04/18/2015-05/26/2015: Right chest wall/50.4 Pear[place] Cables @ 1.8 Pearline Cables per fraction x 28 fractions Right supraclavicular fossa/PAB 45 Gy @1 .8 Gy per fraction x 25 fractions Right scar boost / 10  Gray at [employer] per fraction x 5 fractions  (5) started tamoxifen  07/07/2015--taken very irregularly  (a) s/p TAH-BSO 09/27/2015 with benign pathology  (6) history of celiac disease  (7) neuro psychologic testing 08/29/2016 identified deficiencies on immediate auditory verbal memory span and verbal learning capacity, within the borderline impaired and low average ranges. 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 Anxiety and stress are seen as contributing to a worsening of her cognitive functioning but not as causing her cognitive difficulties. It is not known how long her cognitive difficulties might persist."  (8) empty sella by brain MRI 10/24/2016  PLAN: Misty [name] is now 4-1/2 years out from definitive surgery for her breast cancer with no evidence of disease recurrence.  This is very favorable.  She takes the tamoxifen irregularly.  She just does not manage to remember very clearly when to take it.  We are going to continue tamoxifen for a total of 5 years which is going to take [place] through July 2021.  In terms of side effects she is tolerating it generally well  She has significant disabilities as described above.  She is applying for Social Security disability and if we can be of any help in that regard of course we will be glad to help.  She is going to have her next mammography in January 2021 and she will see me again in February 2021.    I am not sure what the "little bumps" she is having under her skin in various places are going to be.  They are not red or itchy so they are not going to be insect bites.  They are not going to be cancer since her breast cancer generally does not go below the elbow for example.  They may be related to her autoimmune problems.  She will discuss it further with Dr. Michiel Sites at their next visit.  She is taking appropriate pandemic precautions.  She knows to call for any other issues that may  develop before the next visit.    Virgie Dad. Magrinat, MD 04/28/19 10:22 AM Medical Oncology and Hematology [facility] [address] [place], [place] [address] Tel. [phone]    Fax. [phone]   I, Wilburn Mylar, am acting as scribe for Dr. Virgie Dad. Magrinat.  I, Lurline Del MD, have reviewed the above documentation for accuracy and completeness, and I agree with the above.

## 2019-04-28 ENCOUNTER — Other Ambulatory Visit: Payer: Commercial Managed Care - PPO

## 2019-04-28 ENCOUNTER — Inpatient Hospital Stay: Payer: Commercial Managed Care - PPO | Attending: Oncology | Admitting: Oncology

## 2019-04-28 DIAGNOSIS — Z880 Allergy status to penicillin: Secondary | ICD-10-CM

## 2019-04-28 DIAGNOSIS — C50511 Malignant neoplasm of lower-outer quadrant of right female breast: Secondary | ICD-10-CM | POA: Diagnosis not present

## 2019-04-28 DIAGNOSIS — K900 Celiac disease: Secondary | ICD-10-CM

## 2019-04-28 DIAGNOSIS — Z170 Estrogen receptor positive status [ER+]: Secondary | ICD-10-CM

## 2019-04-28 DIAGNOSIS — Z79899 Other long term (current) drug therapy: Secondary | ICD-10-CM

## 2019-04-28 DIAGNOSIS — Z79810 Long term (current) use of selective estrogen receptor modulators (SERMs): Secondary | ICD-10-CM

## 2019-04-28 DIAGNOSIS — Z9221 Personal history of antineoplastic chemotherapy: Secondary | ICD-10-CM

## 2019-04-28 MED ORDER — TAMOXIFEN CITRATE 20 MG PO TABS: 20.0000 mg | ORAL_TABLET | Freq: Every day | ORAL | 4 refills | Status: DC

## 2019-05-25 ENCOUNTER — Other Ambulatory Visit: Payer: Self-pay | Admitting: Oncology

## 2019-08-03 ENCOUNTER — Encounter: Payer: Self-pay | Admitting: Oncology

## 2019-12-07 ENCOUNTER — Other Ambulatory Visit: Payer: Self-pay | Admitting: Endocrinology

## 2019-12-07 DIAGNOSIS — E041 Nontoxic single thyroid nodule: Secondary | ICD-10-CM

## 2019-12-08 ENCOUNTER — Other Ambulatory Visit: Payer: Self-pay | Admitting: Endocrinology

## 2019-12-08 ENCOUNTER — Other Ambulatory Visit: Payer: Self-pay | Admitting: Oncology

## 2019-12-08 DIAGNOSIS — Z1231 Encounter for screening mammogram for malignant neoplasm of breast: Secondary | ICD-10-CM

## 2019-12-09 ENCOUNTER — Other Ambulatory Visit: Payer: Self-pay | Admitting: Endocrinology

## 2019-12-09 DIAGNOSIS — E2839 Other primary ovarian failure: Secondary | ICD-10-CM

## 2019-12-21 ENCOUNTER — Ambulatory Visit
Admission: RE | Admit: 2019-12-21 | Discharge: 2019-12-21 | Disposition: A | Discharge status: Home / Self Care | Payer: Commercial Managed Care - PPO | Source: Ambulatory Visit | Attending: Endocrinology | Admitting: Endocrinology

## 2019-12-21 DIAGNOSIS — E041 Nontoxic single thyroid nodule: Secondary | ICD-10-CM

## 2020-01-27 ENCOUNTER — Other Ambulatory Visit: Payer: Self-pay

## 2020-01-27 ENCOUNTER — Ambulatory Visit
Admission: RE | Admit: 2020-01-27 | Discharge: 2020-01-27 | Disposition: A | Discharge status: Home / Self Care | Payer: Commercial Managed Care - PPO | Source: Ambulatory Visit | Attending: Endocrinology | Admitting: Endocrinology

## 2020-01-27 ENCOUNTER — Ambulatory Visit
Admission: RE | Admit: 2020-01-27 | Discharge: 2020-01-27 | Disposition: A | Discharge status: Home / Self Care | Payer: Commercial Managed Care - PPO | Source: Ambulatory Visit | Attending: Oncology | Admitting: Oncology

## 2020-01-27 DIAGNOSIS — E2839 Other primary ovarian failure: Secondary | ICD-10-CM

## 2020-01-27 DIAGNOSIS — Z1231 Encounter for screening mammogram for malignant neoplasm of breast: Secondary | ICD-10-CM

## 2020-02-08 NOTE — Progress Notes (Signed)
[facility]  [phone] [phone]     ID: Misty [name] DOB: 12/23/1976  MR#: [id number]  CSN#:[id number]  Patient Care Team: Vyas, Dhruv B, MD as PCP - General (Internal Medicine) Ingram, Haywood, MD as Consulting Physician (General Surgery) Magrinat, Gustav C, MD as Consulting Physician (Oncology) Zelson, Michael F, PhD (Inactive) as Consulting Physician (Psychology) Mann, Jyothi, MD as Consulting Physician (Gastroenterology) Balan, Bindubal, MD as Referring Physician (Endocrinology) OTHER MD: Mark Anderson MD (GYN) Claire Sanger M.D. (Plastics)  CHIEF COMPLAINT: Estrogen receptor positive breast cancer  CURRENT TREATMENT: Tamoxifen   INTERVAL HISTORY: Ceirra returns today for follow-up of her estrogen receptor positive breast cancer.  It is not clear whether she is taking tamoxifen.  I asked her if she took it once a week even and she was not able to give me an answer.  Since her last visit, she underwent left screening mammogram on 01/27/2020 at [facility] showing: breast density category C; no evidence of malignancy.  Bone density screening performed the same day showed a T-score of -2.3.  She also underwent thyroid ultrasound on 12/21/2019 for follow up of thyroid nodules. This showed a total of 5 nodules, with only the left inferior thyroid nodule meeting criteria for annual surveillance.   REVIEW OF SYSTEMS: Misty [name] tells me she is very limited in what she can do.  She has significant memory problems.  She had difficulty giving me the names of her endocrinologist or GI doctor.  She has headaches.  She feels dizzy at times.  She has joint pain.  She is very tired and she does not do much during the day.  She tells me she likes to read.  Occasionally she does a little bit of laundry.  She has a concern about an area on her right chest wall that she thinks might be related to cancer.  She is worried that there is an additional nodule  now in her thyroid.  A detailed review of systems today was otherwise stable   BREAST CANCER HISTORY: From the original intake note:  [name] was closely followed for some right breast changes between 20 11/12/2012. Everything seemed stable at that time. In 2014 she was promoted in her job, was working very hard, and actually missed having a mammogram. Late in January 2015 she noted some pain in her right breast and by February when she lifted her arms she noted that the nipple was tilting sideways and seemed a little bit of gathered. She didn't think much of this however. It was not until July that she noted a palpable mass in the lateral right breast. She brought this to her gynecologist's attention and she was set up for diagnostic mammography at [facility] 08/04/2014.   There was an area of distortion in the lateral aspect of the right breast noted on mammography,  corresponding to a palpable firm mass at the 8:00 position of the right breast. Ultrasound confirmed a 2.4 cm irregular hypoechoic spiculated mass in the right breast at the 8:00 position and in addition at the 5:00 position 1 cm from the nipple there was a taller than wide irregular hypoechoic mass measuring 0.8 cm. There was no right axillary adenopathy. In the left breast upper-outer quadrant there was a minimally complicated cyst measuring 0.7 cm.  On 08/11/2014 the patient underwent biopsy of the 2 right breast masses noted on ultrasound. Both showed invasive ductal carcinoma, grade 1, estrogen receptor 90% positive, progesterone receptor 90% positive, both with   strong staining intensity, HER-2 equivocal at 2+ but negative by FISH, with the signals ratio being 1.20 and the number per nucleus 2.1. The proliferation fraction by MIB-1 was 17% ([id number]; this report was reviewed by our pathologist, who concurred, [name]).  On 08/25/2014 the patient underwent bilateral breast MRI at [facility]. This showed, in the  right breast, an irregular enhancing mass at the 8:00 position measuring 2.7 cm. Also in the right breast at the 6:00 position there was another enhancing mass measuring 0.6 cm, located 2 cm away from the larger mass. There were also masses in the retroareolar position measuring 0.7 cm, in the posterior central right breast measuring 0.5 cm, and another one just inferior to the midline measuring 5.5 cm. There were no masses or findings of concern in the left breast and no abnormal appearing lymph nodes.  The patient's subsequent history is as detailed below.   PAST MEDICAL HISTORY: Past Medical History:  Diagnosis Date  . Anxiety    situational  . Cancer (HCC)    breast cancer  . Celiac disease   . Headache(784.0)   . Hypothyroidism   . PONV (postoperative nausea and vomiting)   . Radiation 04/18/15-05/26/15   right breast  . Thyroid disease     PAST SURGICAL HISTORY: Past Surgical History:  Procedure Laterality Date  . CESAREAN SECTION    . CHOLECYSTECTOMY    . GALLBLADDER SURGERY    . LAPAROSCOPIC ASSISTED VAGINAL HYSTERECTOMY N/A 09/27/2015   Procedure: LAPAROSCOPIC ASSISTED VAGINAL HYSTERECTOMY;  Surgeon: Mark E Anderson, MD;  Location: [facility];  Service: Gynecology;  Laterality: N/A;  . LAPAROSCOPIC BILATERAL SALPINGO OOPHERECTOMY Bilateral 09/27/2015   Procedure: LAPAROSCOPIC BILATERAL SALPINGO OOPHORECTOMY;  Surgeon: Mark E Anderson, MD;  Location: [facility];  Service: Gynecology;  Laterality: Bilateral;  . MASTECTOMY Right    malignant  . PORT-A-CATH REMOVAL N/A 09/27/2015   Procedure: REMOVAL PORT-A-CATH;  Surgeon: Haywood Ingram, MD;  Location: [facility];  Service: General;  Laterality: N/A;  Porta Cath removed intact without defects  . PORTACATH PLACEMENT Right 10/22/2014   Procedure: INSERTION PORT-A-CATH;  Surgeon: Haywood Ingram, MD;  Location: [facility];  Service: General;  Laterality: Right;  . SIMPLE MASTECTOMY WITH AXILLARY SENTINEL NODE BIOPSY Right  09/28/2014   Procedure: RIGHT TOTAL MASTECTOMY, RIGHT  AXILLARY SENTINEL NODE BIOPSY;  Surgeon: Haywood Ingram, MD;  Location: [facility];  Service: General;  Laterality: Right;    FAMILY HISTORY Family History  Problem Relation Age of Onset  . Skin cancer Father   . Breast cancer Maternal Grandmother        dx unknown age  . Breast cancer Other 55       mat great aunt through MGM with breast cancer   the patient's parents are both living, in their late 60s. The patient had one brother, no sisters. The patient's maternal grandmother was diagnosed with breast cancer in her late 30s. One of that grandmother's sisters was also diagnosed with breast cancer, in her 50s. There is no other history of breast or ovarian cancer in the family   GYNECOLOGIC HISTORY:  Patient's last menstrual period was 09/21/2015. Menarche age 11, first live birth age 27, the patient is GX P1. She was still having regular periods at the start of chemotherapy, but this stopped early December 2015. 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 Her husband, Herbie [place] "[name]" Latandra Loureiro Junior, worked as a Engineer, structural in [place] but he retired in 2016 and now runs a Building services engineer. Their son [name] is 17 and very active in sports. The patient attends a local [place]: Not in place   HEALTH MAINTENANCE: Social History   Tobacco Use  . Smoking status: Never Smoker  Substance Use Topics  . Alcohol use: No    Alcohol/week: 0.0 standard drinks  . Drug use: No     Colonoscopy:  PAP: 07/29/2014  Bone density:  Lipid panel:  Allergies  Allergen Reactions  . Gluten Meal Other (See Comments)    Has celiac's disease  . Penicillins Nausea And Vomiting    Has patient had a PCN  reaction causing immediate rash, facial/tongue/throat swelling, SOB or lightheadedness with hypotension: No Has patient had a PCN reaction causing severe rash involving mucus membranes or skin necrosis: No Has patient had a PCN reaction that required hospitalization No Has patient had a PCN reaction occurring within the last 10 years: No If all of the above answers are "NO", then may proceed with Cephalosporin use. Son has had a severe reaction to it    Current Outpatient Medications  Medication Sig Dispense Refill  . levothyroxine (SYNTHROID, LEVOTHROID) 88 MCG tablet Take 88 mcg by mouth daily before breakfast.    . metoprolol tartrate (LOPRESSOR) 25 MG tablet Take 1 tablet (25 mg total) by mouth 2 (two) times daily. 30 tablet 3  . tamoxifen (NOLVADEX) 20 MG tablet Take 1 tablet (20 mg total) by mouth daily. 90 tablet 4   No current facility-administered medications for this visit.    OBJECTIVE: Middle-aged white woman who appears stated age  44:   02/09/20 1427  BP: (!) 126/54  Pulse: 100  Resp: 18  Temp: 98 F (36.7 C)  SpO2: 100%     Body mass index is 21.9 kg/m.    ECOG FS:2 - Symptomatic, <50% confined to bed  Sclerae unicteric, EOMs intact Wearing a mask No cervical or supraclavicular adenopathy Lungs no rales or rhonchi Heart regular rate and rhythm Abd soft, nontender, positive bowel sounds MSK no focal spinal tenderness, no upper extremity lymphedema Neuro: nonfocal, well oriented, positive, depressed affect Breasts: The right breast is status post mastectomy.  I do not palpate a suspicious finding in the right chest wall.  There are some irregularities associated with the right incision.  The left breast is benign.  Both axillae are benign Skin: She has some contractures in the tendons of the palm of the hand, and she has some subcutaneous nodules measuring 2 to 3 mm for example in the lower part of her arm.  These are not erythematous or tender.  They appear  benign.  LAB RESULTS:  CMP     Component Value Date/Time   NA 146 (H) 02/09/2020 1403   NA 143 10/17/2016 0850   K 4.5 02/09/2020 1403   K 4.9 10/17/2016 0850   CL 108 02/09/2020 1403   CO2 29 02/09/2020 1403   CO2 28 10/17/2016 0850   GLUCOSE 92 02/09/2020 1403   GLUCOSE 98 10/17/2016 0850   BUN 14 02/09/2020 1403   BUN 17.0 10/17/2016 0850   CREATININE 1.00 02/09/2020 1403   CREATININE 0.7 10/17/2016 0850   CALCIUM 9.2 02/09/2020 1403   CALCIUM 10.2 10/17/2016 0850   PROT 7.2 02/09/2020 1403  PROT 7.5 10/17/2016 0850   ALBUMIN 4.1 02/09/2020 1403   ALBUMIN 4.0 10/17/2016 0850   AST 15 02/09/2020 1403   AST 21 10/17/2016 0850   ALT 11 02/09/2020 1403   ALT 16 10/17/2016 0850   ALKPHOS 80 02/09/2020 1403   ALKPHOS 107 10/17/2016 0850   BILITOT 0.4 02/09/2020 1403   BILITOT 0.44 10/17/2016 0850   GFRNONAA >60 02/09/2020 1403   GFRAA >60 02/09/2020 1403    I No results found for: SPEP  Lab Results  Component Value Date   WBC 6.9 02/09/2020   NEUTROABS 4.8 02/09/2020   HGB 13.6 02/09/2020   HCT 40.0 02/09/2020   MCV 92.6 02/09/2020   PLT 197 02/09/2020      Chemistry      Component Value Date/Time   NA 146 (H) 02/09/2020 1403   NA 143 10/17/2016 0850   K 4.5 02/09/2020 1403   K 4.9 10/17/2016 0850   CL 108 02/09/2020 1403   CO2 29 02/09/2020 1403   CO2 28 10/17/2016 0850   BUN 14 02/09/2020 1403   BUN 17.0 10/17/2016 0850   CREATININE 1.00 02/09/2020 1403   CREATININE 0.7 10/17/2016 0850      Component Value Date/Time   CALCIUM 9.2 02/09/2020 1403   CALCIUM 10.2 10/17/2016 0850   ALKPHOS 80 02/09/2020 1403   ALKPHOS 107 10/17/2016 0850   AST 15 02/09/2020 1403   AST 21 10/17/2016 0850   ALT 11 02/09/2020 1403   ALT 16 10/17/2016 0850   BILITOT 0.4 02/09/2020 1403   BILITOT 0.44 10/17/2016 0850      No results found for: LABCA2  No components found for: LABCA125  No results for input(s): INR in the last 168 hours.  Urinalysis No  results found for: COLORURINE   STUDIES: DG BONE DENSITY (DXA)  Result Date: 01/27/2020 EXAM: DUAL X-RAY ABSORPTIOMETRY (DXA) FOR BONE MINERAL DENSITY IMPRESSION: Referring Physician:  BINDUBAL BALAN Your patient completed a BMD test using Lunar IDXA DXA system ( analysis version: 16 ) manufactured by [employer]. Technologist: AW PATIENT: Name: Noblet, Najiyah S Patient ID: [id number] Birth Date: 10/22/1976 Height: 61.0 in. Sex: Female Measured: 01/27/2020 Weight: 119.0 lbs. Indications: Bilateral Ovariectomy (65.51), Breast Cancer History, Caucasian, Celiac Disease, Estrogen Deficient, Hypothyroid, Hysterectomy, Synthroid, Tamoxifen Fractures: None Treatments: Hormone Therapy For Cancer ASSESSMENT: The BMD measured at AP Spine L1-L4 is 0.906 g/cm2 with a T-score of -2.3. This patient is considered OSTEOPENIC according to World Health Organization (WHO) criteria. The scan quality is good. Site Region Measured Date Measured Age YA T-score BMD Significant CHANGE AP Spine  L1-L4      01/27/2020    45.6         -2.3    0.906 g/cm2 DualFemur Neck Left  01/27/2020    45.6         -1.8    0.787 g/cm2 DualFemur Total Mean 01/27/2020    45.6         -1.6    0.806 g/cm2 World Health Organization (WHO) criteria for post-menopausal, Caucasian Women: Normal       T-score at or above -1 SD Osteopenia   T-score between -1 and -2.5 SD Osteoporosis T-score at or below -2.5 SD RECOMMENDATION: 1. All patients should optimize calcium and vitamin D intake. 2. Consider FDA approved medical therapies in postmenopausal women and men aged 50 years and older, based on the following: a. A hip or vertebral (clinical or morphometric) fracture b. T-score < or = -2.5 at   the femoral neck or spine after appropriate evaluation to exclude secondary causes c. Low bone mass (T-score between -1.0 and -2.5 at the femoral neck or spine) and a 10 year probability of a hip fracture > or = 3% or a 10 year probability of a major osteoporosis-related  fracture > or = 20% based on the [place]-adapted WHO algorithm d. Clinician judgment and/or patient preferences may indicate treatment for people with 10-year fracture probabilities above or below these levels FOLLOW-UP: Patients with diagnosis of osteoporosis or at high risk for fracture should have regular bone mineral density tests. For patients eligible for Medicare, routine testing is allowed once every 2 years. The testing frequency can be increased to one year for patients who have rapidly progressing disease, those who are receiving or discontinuing medical therapy to restore bone mass, or have additional risk factors. I have reviewed this report and agree with the above findings. Mark A. Thornton Papas, M.D. [facility] FRAX* 10-year Probability of Fracture Based on femoral neck BMD: DualFemur (Left) Major Osteoporotic Fracture: 3.4% Hip Fracture:                0.5% Population:                  [place] (Caucasian) Risk Factors:                None *FRAX is a Materials engineer of the [employer] for Metabolic Bone Disease, a World Pharmacologist (WHO) [facility]. ASSESSMENT: The probability of a major osteoporotic fracture is 3.4 % within the next ten years. The probability of a hip fracture is 0.5 % within the next ten years. I have reviewed this report and agree with the above findings. Mark A. Thornton Papas, M.D. [facility] Electronically Signed   By: Lavonia Dana M.D.   On: 01/27/2020 09:19   MM 3D SCREEN BREAST UNI LEFT  Result Date: 01/27/2020 CLINICAL DATA:  Screening. EXAM: DIGITAL SCREENING UNILATERAL LEFT MAMMOGRAM WITH CAD AND TOMO COMPARISON:  Previous exam(s). ACR Breast Density Category c: The breast tissue is heterogeneously dense, which may obscure small masses. FINDINGS: The patient has had a right mastectomy. There are no findings suspicious for malignancy. Images were processed with CAD. IMPRESSION: No mammographic evidence of malignancy. A result  letter of this screening mammogram will be mailed directly to the patient. RECOMMENDATION: Screening mammogram in one year.  (Code:SM-L-56M) BI-RADS CATEGORY  1: Negative. Electronically Signed   By: Kristopher Oppenheim M.D.   On: 01/27/2020 09:41     ASSESSMENT: 44 y.o. BRCA negative [place], [place] woman status post biopsy of 2 separate right breast lower outer quadrant masses 08/11/2014, both positive for an invasive ductal carcinoma, grade 1, estrogen and progesterone receptor strongly positive, with an MIB-1 of 19%, and HER-2 nonamplified  (1) genetics testing sent 08/26/2014, showing no deleterious mutations in the BRCA genes; there were also no mutations in ATM, BARD 1, BRIP1, CDH 1, CHE K2, MR E11A, M UT YH, N BN, NF1, PA L B2, PTE N, RAD 50, RAD 50 1C, RAD 50 1D, and T p53.  (2) status post right mastectomy with sentinel lymph node sampling 09/28/2014 for an mpT2 pN1, stage IIB invasive breast cancer (with both ductal and lobular features), grade 2, with negative margins  (3) adjuvant chemotherapy consisted of doxorubicin and cyclophosphamide in dose dense fashion 4, completed 12/30/2014, followed by paclitaxel weekly x 12 started 01/21/2015.  (a) paclitaxel stopped after 5 treatments due to persistent neuropathy,  last dose 02/18/2015  (4) adjuvant radiation given 04/18/2015-05/26/2015: Right chest wall/50.4 Pearline Cables @ 1.8 Gray per fraction x 28 fractions Right supraclavicular fossa/PAB 45 Gy _0 .8 Gy per fraction x 25 fractions Right scar boost / 10 Gray at [employer] per fraction x 5 fractions  (5) started tamoxifen 07/07/2015--taken very irregularly  (a) s/p TAH-BSO 09/27/2015 with benign pathology  (b) bone density 01/27/2020 showed a T score of -2.3  (6) history of celiac disease  (7) neuro psychologic testing 08/29/2016 identified deficiencies on immediate auditory verbal memory span and verbal learning capacity, within the borderline impaired and low average ranges. 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 Anxiety and stress are seen as contributing to a worsening of her cognitive functioning but not as causing her cognitive difficulties. It is not known how long her cognitive difficulties might persist."  (8) empty sella by brain MRI 10/24/2016   PLAN: Cionna is now approximately 5-1/2 years out from definitive surgery for her breast cancer with no evidence of disease recurrence.  This is very favorable.  I remain concerned that she does not take tamoxifen as prescribed.  It is very difficult for me to tell whether she takes tamoxifen at all at this point.  She tells me that Dr. Michiel Sites had her endocrinologist wondered why she had never had a PET scan.  We went over the fact that in general we do not do PET scans except for stage IV disease and for patient to have a CT scan with questions that cannot otherwise be answered except with a PET scan.  Insurance simply will not approve it otherwise.  However at 5 years and given the fact that she has so many symptoms of concern I think it may be reassuring for her to have CT scans and I am putting her in for these.  I am hopeful we will not see any evidence of stage IV disease.  If there are any questions of course then a PET scan may be necessary.  I think she might benefit from physical therapy and I am putting in a prescription regarding that.  Otherwise she will see me again in 6 months.  She knows to call for any other issue that may develop before that visit.  Total encounter time 30 minutes.Sarajane Jews C. Cherilynn Schomburg, MD 02/09/20 3:26 PM Medical Oncology and Hematology [facility] [place], [place] [address] Tel. [phone]    Fax. [phone]   I, Wilburn Mylar, am acting as scribe for Dr. Virgie Dad. Shalin Linders.  I, Lurline Del MD, have reviewed the above documentation for accuracy and completeness, and I agree with the  above.   *Total Encounter Time as defined by the Centers for Medicare and Medicaid Services includes, in addition to the face-to-face time of a patient visit (documented in the note above) non-face-to-face time: obtaining and reviewing outside history, ordering and reviewing medications, tests or procedures, care coordination (communications with other health care professionals or caregivers) and documentation in the medical record.

## 2020-02-09 ENCOUNTER — Other Ambulatory Visit: Payer: Self-pay

## 2020-02-09 ENCOUNTER — Inpatient Hospital Stay: Payer: Commercial Managed Care - PPO

## 2020-02-09 ENCOUNTER — Inpatient Hospital Stay: Payer: Commercial Managed Care - PPO | Attending: Oncology | Admitting: Oncology

## 2020-02-09 VITALS — BP 126/54 | HR 100 | Temp 98.0°F | Resp 18 | Ht 61.0 in | Wt 115.9 lb

## 2020-02-09 DIAGNOSIS — C50511 Malignant neoplasm of lower-outer quadrant of right female breast: Secondary | ICD-10-CM

## 2020-02-09 DIAGNOSIS — R5383 Other fatigue: Secondary | ICD-10-CM | POA: Insufficient documentation

## 2020-02-09 DIAGNOSIS — Z9221 Personal history of antineoplastic chemotherapy: Secondary | ICD-10-CM | POA: Insufficient documentation

## 2020-02-09 DIAGNOSIS — Z803 Family history of malignant neoplasm of breast: Secondary | ICD-10-CM | POA: Diagnosis not present

## 2020-02-09 DIAGNOSIS — E039 Hypothyroidism, unspecified: Secondary | ICD-10-CM | POA: Insufficient documentation

## 2020-02-09 DIAGNOSIS — K900 Celiac disease: Secondary | ICD-10-CM | POA: Insufficient documentation

## 2020-02-09 DIAGNOSIS — R42 Dizziness and giddiness: Secondary | ICD-10-CM | POA: Insufficient documentation

## 2020-02-09 DIAGNOSIS — Z170 Estrogen receptor positive status [ER+]: Secondary | ICD-10-CM | POA: Diagnosis not present

## 2020-02-09 DIAGNOSIS — R519 Headache, unspecified: Secondary | ICD-10-CM | POA: Diagnosis not present

## 2020-02-09 DIAGNOSIS — Z9011 Acquired absence of right breast and nipple: Secondary | ICD-10-CM | POA: Insufficient documentation

## 2020-02-09 DIAGNOSIS — Z923 Personal history of irradiation: Secondary | ICD-10-CM | POA: Insufficient documentation

## 2020-02-09 DIAGNOSIS — Z79899 Other long term (current) drug therapy: Secondary | ICD-10-CM | POA: Insufficient documentation

## 2020-02-09 DIAGNOSIS — F419 Anxiety disorder, unspecified: Secondary | ICD-10-CM | POA: Diagnosis not present

## 2020-02-09 LAB — COMPREHENSIVE METABOLIC PANEL
ALT: 11 U/L (ref 0–44)
AST: 15 U/L (ref 15–41)
Albumin: 4.1 g/dL (ref 3.5–5.0)
Alkaline Phosphatase: 80 U/L (ref 38–126)
Anion gap: 9 (ref 5–15)
BUN: 14 mg/dL (ref 6–20)
CO2: 29 mmol/L (ref 22–32)
Calcium: 9.2 mg/dL (ref 8.9–10.3)
Chloride: 108 mmol/L (ref 98–111)
Creatinine, Ser: 1 mg/dL (ref 0.44–1.00)
GFR calc Af Amer: >60 mL/min (ref >60)
GFR calc non Af Amer: >60 mL/min (ref >60)
Glucose, Bld: 92 mg/dL (ref 70–99)
Potassium: 4.5 mmol/L (ref 3.5–5.1)
Sodium: 146 mmol/L — ABNORMAL HIGH (ref 135–145)
Total Bilirubin: 0.4 mg/dL (ref 0.3–1.2)
Total Protein: 7.2 g/dL (ref 6.5–8.1)

## 2020-02-09 LAB — CBC WITH DIFFERENTIAL/PLATELET
Abs Immature Granulocytes: 0.02 10*3/uL (ref 0.00–0.07)
Basophils Absolute: 0 10*3/uL (ref 0.0–0.1)
Basophils Relative: 0 %
Eosinophils Absolute: 0.3 10*3/uL (ref 0.0–0.5)
Eosinophils Relative: 5 %
HCT: 40 % (ref 36.0–46.0)
Hemoglobin: 13.6 g/dL (ref 12.0–15.0)
Immature Granulocytes: 0 %
Lymphocytes Relative: 18 %
Lymphs Abs: 1.3 10*3/uL (ref 0.7–4.0)
MCH: 31.5 pg (ref 26.0–34.0)
MCHC: 34 g/dL (ref 30.0–36.0)
MCV: 92.6 fL (ref 80.0–100.0)
Monocytes Absolute: 0.5 10*3/uL (ref 0.1–1.0)
Monocytes Relative: 7 %
Neutro Abs: 4.8 10*3/uL (ref 1.7–7.7)
Neutrophils Relative %: 70 %
Platelets: 197 10*3/uL (ref 150–400)
RBC: 4.32 MIL/uL (ref 3.87–5.11)
RDW: 13 % (ref 11.5–15.5)
WBC: 6.9 10*3/uL (ref 4.0–10.5)
nRBC: 0 % (ref 0.0–0.2)

## 2020-02-10 ENCOUNTER — Telehealth: Payer: Self-pay | Admitting: Oncology

## 2020-02-10 NOTE — Telephone Encounter (Signed)
I talk with patient regarding schedule  

## 2020-07-19 ENCOUNTER — Other Ambulatory Visit: Payer: Self-pay | Admitting: Oncology

## 2020-07-25 ENCOUNTER — Telehealth: Payer: Self-pay | Admitting: Oncology

## 2020-07-25 NOTE — Telephone Encounter (Signed)
Rescheduled appts per provider PAL and print out with hand written instructions. Left voicemail with cancellation and new appt details.

## 2020-08-01 ENCOUNTER — Other Ambulatory Visit: Payer: Self-pay | Admitting: Endocrinology

## 2020-08-01 DIAGNOSIS — E041 Nontoxic single thyroid nodule: Secondary | ICD-10-CM

## 2020-08-09 ENCOUNTER — Other Ambulatory Visit: Payer: Commercial Managed Care - PPO

## 2020-08-09 ENCOUNTER — Ambulatory Visit: Payer: Commercial Managed Care - PPO | Admitting: Oncology

## 2020-08-18 ENCOUNTER — Telehealth: Payer: Self-pay | Admitting: Adult Health

## 2020-08-18 NOTE — Telephone Encounter (Signed)
Rescheduled 9/1 appt per LC's request. LC will be out of the office. Left voicemail with cancellation details and new appt date and time.

## 2020-08-24 ENCOUNTER — Inpatient Hospital Stay: Payer: No Typology Code available for payment source

## 2020-08-24 ENCOUNTER — Inpatient Hospital Stay: Payer: No Typology Code available for payment source | Admitting: Adult Health

## 2020-09-15 ENCOUNTER — Other Ambulatory Visit: Payer: Self-pay | Admitting: *Deleted

## 2020-09-15 DIAGNOSIS — C50511 Malignant neoplasm of lower-outer quadrant of right female breast: Secondary | ICD-10-CM

## 2020-09-16 ENCOUNTER — Inpatient Hospital Stay (HOSPITAL_BASED_OUTPATIENT_CLINIC_OR_DEPARTMENT_OTHER): Payer: Medicare HMO | Admitting: Adult Health

## 2020-09-16 ENCOUNTER — Other Ambulatory Visit: Payer: Self-pay

## 2020-09-16 ENCOUNTER — Inpatient Hospital Stay: Payer: Medicare HMO | Attending: Adult Health

## 2020-09-16 ENCOUNTER — Encounter: Payer: Self-pay | Admitting: *Deleted

## 2020-09-16 ENCOUNTER — Encounter: Payer: Self-pay | Admitting: Adult Health

## 2020-09-16 ENCOUNTER — Telehealth: Payer: Self-pay | Admitting: Oncology

## 2020-09-16 VITALS — BP 130/65 | HR 87 | Temp 97.3°F | Resp 20 | Ht 61.0 in | Wt 114.5 lb

## 2020-09-16 DIAGNOSIS — Z170 Estrogen receptor positive status [ER+]: Secondary | ICD-10-CM | POA: Insufficient documentation

## 2020-09-16 DIAGNOSIS — Z79899 Other long term (current) drug therapy: Secondary | ICD-10-CM | POA: Insufficient documentation

## 2020-09-16 DIAGNOSIS — F419 Anxiety disorder, unspecified: Secondary | ICD-10-CM | POA: Diagnosis not present

## 2020-09-16 DIAGNOSIS — E039 Hypothyroidism, unspecified: Secondary | ICD-10-CM | POA: Insufficient documentation

## 2020-09-16 DIAGNOSIS — Z79810 Long term (current) use of selective estrogen receptor modulators (SERMs): Secondary | ICD-10-CM | POA: Diagnosis not present

## 2020-09-16 DIAGNOSIS — Z803 Family history of malignant neoplasm of breast: Secondary | ICD-10-CM | POA: Diagnosis not present

## 2020-09-16 DIAGNOSIS — Z9221 Personal history of antineoplastic chemotherapy: Secondary | ICD-10-CM | POA: Insufficient documentation

## 2020-09-16 DIAGNOSIS — Z9079 Acquired absence of other genital organ(s): Secondary | ICD-10-CM | POA: Insufficient documentation

## 2020-09-16 DIAGNOSIS — C50411 Malignant neoplasm of upper-outer quadrant of right female breast: Secondary | ICD-10-CM | POA: Diagnosis not present

## 2020-09-16 DIAGNOSIS — C50511 Malignant neoplasm of lower-outer quadrant of right female breast: Secondary | ICD-10-CM

## 2020-09-16 DIAGNOSIS — Z90710 Acquired absence of both cervix and uterus: Secondary | ICD-10-CM | POA: Diagnosis not present

## 2020-09-16 DIAGNOSIS — K900 Celiac disease: Secondary | ICD-10-CM | POA: Diagnosis not present

## 2020-09-16 DIAGNOSIS — Z90722 Acquired absence of ovaries, bilateral: Secondary | ICD-10-CM | POA: Insufficient documentation

## 2020-09-16 DIAGNOSIS — Z9049 Acquired absence of other specified parts of digestive tract: Secondary | ICD-10-CM | POA: Diagnosis not present

## 2020-09-16 DIAGNOSIS — G629 Polyneuropathy, unspecified: Secondary | ICD-10-CM | POA: Insufficient documentation

## 2020-09-16 DIAGNOSIS — Z923 Personal history of irradiation: Secondary | ICD-10-CM | POA: Insufficient documentation

## 2020-09-16 LAB — CBC WITH DIFFERENTIAL (CANCER CENTER ONLY)
Abs Immature Granulocytes: 0.01 10*3/uL (ref 0.00–0.07)
Basophils Absolute: 0 10*3/uL (ref 0.0–0.1)
Basophils Relative: 1 %
Eosinophils Absolute: 0.2 10*3/uL (ref 0.0–0.5)
Eosinophils Relative: 3 %
HCT: 40.9 % (ref 36.0–46.0)
Hemoglobin: 13.5 g/dL (ref 12.0–15.0)
Immature Granulocytes: 0 %
Lymphocytes Relative: 19 %
Lymphs Abs: 1.1 10*3/uL (ref 0.7–4.0)
MCH: 30.4 pg (ref 26.0–34.0)
MCHC: 33 g/dL (ref 30.0–36.0)
MCV: 92.1 fL (ref 80.0–100.0)
Monocytes Absolute: 0.3 10*3/uL (ref 0.1–1.0)
Monocytes Relative: 6 %
Neutro Abs: 4.1 10*3/uL (ref 1.7–7.7)
Neutrophils Relative %: 71 %
Platelet Count: 199 10*3/uL (ref 150–400)
RBC: 4.44 MIL/uL (ref 3.87–5.11)
RDW: 12.9 % (ref 11.5–15.5)
WBC Count: 5.7 10*3/uL (ref 4.0–10.5)
nRBC: 0 % (ref 0.0–0.2)

## 2020-09-16 LAB — CMP (CANCER CENTER ONLY)
ALT: 13 U/L (ref 0–44)
AST: 18 U/L (ref 15–41)
Albumin: 4.2 g/dL (ref 3.5–5.0)
Alkaline Phosphatase: 75 U/L (ref 38–126)
Anion gap: 6 (ref 5–15)
BUN: 13 mg/dL (ref 6–20)
CO2: 28 mmol/L (ref 22–32)
Calcium: 9.6 mg/dL (ref 8.9–10.3)
Chloride: 104 mmol/L (ref 98–111)
Creatinine: 0.73 mg/dL (ref 0.44–1.00)
GFR, Est AFR Am: >60 mL/min (ref >60)
GFR, Estimated: >60 mL/min (ref >60)
Glucose, Bld: 100 mg/dL — ABNORMAL HIGH (ref 70–99)
Potassium: 4.3 mmol/L (ref 3.5–5.1)
Sodium: 138 mmol/L (ref 135–145)
Total Bilirubin: 0.8 mg/dL (ref 0.3–1.2)
Total Protein: 7.4 g/dL (ref 6.5–8.1)

## 2020-09-16 NOTE — Progress Notes (Signed)
Misty [name]  [phone] Fax:([phone]) [phone]     ID: Misty [name] DOB: 06-21-1976  MR#: [id number]  YIF#:[id number]  Patient Care Team: Glenda Chroman, MD as PCP - General (Internal Medicine) Fanny Skates, MD as Consulting Physician (General Surgery) Magrinat, Virgie Dad, MD as Consulting Physician (Oncology) Jamey Ripa, PhD (Inactive) as Consulting Physician (Psychology) Juanita Craver, MD as Consulting Physician (Gastroenterology) Jacelyn Pi, MD as Referring Physician (Endocrinology) OTHER MD: Freda Munro MD (GYN) Theodoro Kos M.D. (Plastics)  CHIEF COMPLAINT: Estrogen receptor positive breast cancer  CURRENT TREATMENT: Tamoxifen   INTERVAL HISTORY: Ocean returns today for follow-up of her estrogen receptor positive breast cancer.  Misty [name] has a difficult time with her memory and says that she takes tamoxifen when she remembers.    Since her last visit, she underwent left screening mammogram on 01/27/2020 at [facility] showing: breast density category C; no evidence of malignancy.  Bone density screening performed the same day showed a T-score of -2.3.  She continues to f/u with her endocrinologist for her thyroid nodules and synthroid.  She is due for her next ultrasound in 11/2020 and labs and f/u with them in 12/2020.   REVIEW OF SYSTEMS: Chen tells me that she is feeling about the same as she normally does.  She has fatigue, which makes it challenging to exercise, chronic headaches, memory changes, dysphagia at times, and smell alterations.  She has seen many physicians for this issue, and has undergone scans and a work up for this.  She notes that everything is stable, and with her empty sella, thyroid issues, and h/o cancer, it all has been a chronic issue she is dealing with.    She does tell me that she is concerned that her cancer has spread, since initially it was in a lymph node.  She would really like a pet scan  because she does have some symptoms with feeling so poorly and pain that makes her concerned.  Dr. Jana Hakim ordered a CT scan, however she does not want to drink the contrast due to her h/o celiac disease and concern it may upset this and cause her to feel poorly.  She wants to see if insurance will cover the pet scan first, and if not, then the CT scan.     BREAST CANCER HISTORY: From the original intake note:  Misty [name] was closely followed for some right breast changes between 20 11/12/2012. Everything seemed stable at that time. In 2014 she was promoted in her job, was working very hard, and actually missed having a mammogram. Late in January 2015 she noted some pain in her right breast and by February when she lifted her arms she noted that the nipple was tilting sideways and seemed a little bit of gathered. She didn't think much of this however. It was not until July that she noted a palpable mass in the lateral right breast. She brought this to her gynecologist's attention and she was set up for diagnostic mammography at [facility] 08/04/2014.   There was an area of distortion in the lateral aspect of the right breast noted on mammography,  corresponding to a palpable firm mass at the 8:00 position of the right breast. Ultrasound confirmed a 2.4 cm irregular hypoechoic spiculated mass in the right breast at the 8:00 position and in addition at the 5:00 position 1 cm from the nipple there was a taller than wide irregular hypoechoic mass measuring 0.8 cm. There was no right axillary  adenopathy. In the left breast upper-outer quadrant there was a minimally complicated cyst measuring 0.7 cm.  On 08/11/2014 the patient underwent biopsy of the 2 right breast masses noted on ultrasound. Both showed invasive ductal carcinoma, grade 1, estrogen receptor 90% positive, progesterone receptor 90% positive, both with strong staining intensity, HER-2 equivocal at 2+ but negative by FISH, with the signals ratio  being 1.20 and the number per nucleus 2.1. The proliferation fraction by MIB-1 was 17% ([id number]; this report was reviewed by our pathologist, who concurred, [name] [phone]).  On 08/25/2014 the patient underwent bilateral breast MRI at [facility]. This showed, in the right breast, an irregular enhancing mass at the 8:00 position measuring 2.7 cm. Also in the right breast at the 6:00 position there was another enhancing mass measuring 0.6 cm, located 2 cm away from the larger mass. There were also masses in the retroareolar position measuring 0.7 cm, in the posterior central right breast measuring 0.5 cm, and another one just inferior to the midline measuring 5.5 cm. There were no masses or findings of concern in the left breast and no abnormal appearing lymph nodes.  The patient's subsequent history is as detailed below.   PAST MEDICAL HISTORY: Past Medical History:  Diagnosis Date  . Anxiety    situational  . Cancer ([place])    breast cancer  . Celiac disease   . Headache(784.0)   . Hypothyroidism   . PONV (postoperative nausea and vomiting)   . Radiation 04/18/15-05/26/15   right breast  . Thyroid disease     PAST SURGICAL HISTORY: Past Surgical History:  Procedure Laterality Date  . CESAREAN SECTION    . CHOLECYSTECTOMY    . GALLBLADDER SURGERY    . LAPAROSCOPIC ASSISTED VAGINAL HYSTERECTOMY N/A 09/27/2015   Procedure: LAPAROSCOPIC ASSISTED VAGINAL HYSTERECTOMY;  Surgeon: Olga Millers, MD;  Location: [facility];  Service: Gynecology;  Laterality: N/A;  . LAPAROSCOPIC BILATERAL SALPINGO OOPHERECTOMY Bilateral 09/27/2015   Procedure: LAPAROSCOPIC BILATERAL SALPINGO OOPHORECTOMY;  Surgeon: Olga Millers, MD;  Location: [facility];  Service: Gynecology;  Laterality: Bilateral;  . MASTECTOMY Right    malignant  . PORT-A-CATH REMOVAL N/A 09/27/2015   Procedure: REMOVAL PORT-A-CATH;  Surgeon: Fanny Skates, MD;  Location: [facility];  Service: General;  Laterality: N/A;  Porta Cath removed  intact without defects  . PORTACATH PLACEMENT Right 10/22/2014   Procedure: INSERTION PORT-A-CATH;  Surgeon: Fanny Skates, MD;  Location: [facility];  Service: General;  Laterality: Right;  . SIMPLE MASTECTOMY WITH AXILLARY SENTINEL NODE BIOPSY Right 09/28/2014   Procedure: RIGHT TOTAL MASTECTOMY, RIGHT  AXILLARY SENTINEL NODE BIOPSY;  Surgeon: Fanny Skates, MD;  Location: [facility];  Service: General;  Laterality: Right;    FAMILY HISTORY Family History  Problem Relation Age of Onset  . Skin cancer Father   . Breast cancer Maternal Grandmother        dx unknown age  . Breast cancer Other 44       mat great aunt through [facility] with breast cancer   the patient's parents are both living, in their late 74s. The patient had one brother, no sisters. The patient's maternal grandmother was diagnosed with breast cancer in her late 6s. One of that grandmother's sisters was also diagnosed with breast cancer, in her 24s. There is no other history of breast or ovarian cancer in the family   GYNECOLOGIC HISTORY:  Patient's last menstrual period was 09/21/2015. Menarche age 17, first live birth age  27, the patient is GX P1. She was still having regular periods at the start of chemotherapy, but this stopped early December 2015. She took oral contraceptives for approximately 9 years with no complications   SOCIAL HISTORY: (updated February 2021) [name] worked as a Librarian, academic in the collections section of the [employer] but she is on disability with the state. Her husband, Herbie Baltimore "[name]" Arbadella Kimbler Junior, worked as a Engineer, structural in [place] but he retired in 2016 and now runs a Building services engineer. Their son [name] is 23 and very active in sports. The patient attends a local [place]: Not in place   HEALTH MAINTENANCE: Social History   Tobacco Use  . Smoking status: Never Smoker  Substance Use Topics  . Alcohol use: No     Alcohol/week: 0.0 standard drinks  . Drug use: No     Colonoscopy:  PAP: 07/29/2014  Bone density:  Lipid panel:  Allergies  Allergen Reactions  . Gluten Meal Other (See Comments)    Has celiac's disease  . Penicillins Nausea And Vomiting    Has patient had a PCN reaction causing immediate rash, facial/tongue/throat swelling, SOB or lightheadedness with hypotension: No Has patient had a PCN reaction causing severe rash involving mucus membranes or skin necrosis: No Has patient had a PCN reaction that required hospitalization No Has patient had a PCN reaction occurring within the last 10 years: No If all of the above answers are "NO", then may proceed with Cephalosporin use. Son has had a severe reaction to it    Current Outpatient Medications  Medication Sig Dispense Refill  . levothyroxine (SYNTHROID, LEVOTHROID) 88 MCG tablet Take 88 mcg by mouth daily before breakfast.    . metoprolol tartrate (LOPRESSOR) 25 MG tablet Take 1 tablet (25 mg total) by mouth 2 (two) times daily. 30 tablet 3  . tamoxifen (NOLVADEX) 20 MG tablet Take 1 tablet (20 mg total) by mouth daily. 90 tablet 4   No current facility-administered medications for this visit.    OBJECTIVE: Middle-aged white woman who appears stated age  20:   09/16/20 1205  BP: 130/65  Pulse: 87  Resp: 20  Temp: (!) 97.3 F (36.3 C)  SpO2: 99%     Body mass index is 21.63 kg/m.    ECOG FS:2 - Symptomatic, <50% confined to bed  GENERAL: Patient is a well appearing female in no acute distress HEENT:  Sclerae anicteric.  Oropharynx clear and moist. No ulcerations or evidence of oropharyngeal candidiasis. Neck is supple.  NODES:  No cervical, supraclavicular, or axillary lymphadenopathy palpated.  BREAST EXAM:  Right breast s/p mastectomy--no sign of local recurrence, left breast benign LUNGS:  Clear to auscultation bilaterally.  No wheezes or rhonchi. HEART:  Regular rate and rhythm. No murmur appreciated. ABDOMEN:   Soft, nontender.  Positive, normoactive bowel sounds. No organomegaly palpated. MSK:  No focal spinal tenderness to palpation. Full range of motion bilaterally in the upper extremities. EXTREMITIES:  No peripheral edema.   SKIN:  Clear with no obvious rashes or skin changes. No nail dyscrasia. NEURO:  Nonfocal. Well oriented.  Appropriate affect.    LAB RESULTS:  CMP     Component Value Date/Time   NA 146 (H) 02/09/2020 1403   NA 143 10/17/2016 0850   K 4.5 02/09/2020 1403   K 4.9 10/17/2016 0850   CL 108 02/09/2020 1403   CO2 29 02/09/2020 1403   CO2 28 10/17/2016 0850   GLUCOSE  92 02/09/2020 1403   GLUCOSE 98 10/17/2016 0850   BUN 14 02/09/2020 1403   BUN 17.0 10/17/2016 0850   CREATININE 1.00 02/09/2020 1403   CREATININE 0.7 10/17/2016 0850   CALCIUM 9.2 02/09/2020 1403   CALCIUM 10.2 10/17/2016 0850   PROT 7.2 02/09/2020 1403   PROT 7.5 10/17/2016 0850   ALBUMIN 4.1 02/09/2020 1403   ALBUMIN 4.0 10/17/2016 0850   AST 15 02/09/2020 1403   AST 21 10/17/2016 0850   ALT 11 02/09/2020 1403   ALT 16 10/17/2016 0850   ALKPHOS 80 02/09/2020 1403   ALKPHOS 107 10/17/2016 0850   BILITOT 0.4 02/09/2020 1403   BILITOT 0.44 10/17/2016 0850   GFRNONAA >60 02/09/2020 1403   GFRAA >60 02/09/2020 1403    I No results found for: SPEP  Lab Results  Component Value Date   WBC 5.7 09/16/2020   NEUTROABS 4.1 09/16/2020   HGB 13.5 09/16/2020   HCT 40.9 09/16/2020   MCV 92.1 09/16/2020   PLT 199 09/16/2020      Chemistry      Component Value Date/Time   NA 146 (H) 02/09/2020 1403   NA 143 10/17/2016 0850   K 4.5 02/09/2020 1403   K 4.9 10/17/2016 0850   CL 108 02/09/2020 1403   CO2 29 02/09/2020 1403   CO2 28 10/17/2016 0850   BUN 14 02/09/2020 1403   BUN 17.0 10/17/2016 0850   CREATININE 1.00 02/09/2020 1403   CREATININE 0.7 10/17/2016 0850      Component Value Date/Time   CALCIUM 9.2 02/09/2020 1403   CALCIUM 10.2 10/17/2016 0850   ALKPHOS 80 02/09/2020 1403     ALKPHOS 107 10/17/2016 0850   AST 15 02/09/2020 1403   AST 21 10/17/2016 0850   ALT 11 02/09/2020 1403   ALT 16 10/17/2016 0850   BILITOT 0.4 02/09/2020 1403   BILITOT 0.44 10/17/2016 0850      No results found for: LABCA2  No components found for: LABCA125  No results for input(s): INR in the last 168 hours.  Urinalysis No results found for: COLORURINE   STUDIES: No results found.   ASSESSMENT: 44 y.o. BRCA negative [place], [place] woman status post biopsy of 2 separate right breast lower outer quadrant masses 08/11/2014, both positive for an invasive ductal carcinoma, grade 1, estrogen and progesterone receptor strongly positive, with an MIB-1 of 19%, and HER-2 nonamplified  (1) genetics testing sent 08/26/2014, showing no deleterious mutations in the BRCA genes; there were also no mutations in ATM, BARD 1, BRIP1, Garey 1, CHE K2, MR E11A, M UT YH, N BN, NF1, PA L B2, PTE N, RAD 50, RAD 50 1C, RAD 50 1D, and T p53.  (2) status post right mastectomy with sentinel lymph node sampling 09/28/2014 for an mpT2 pN1, stage IIB invasive breast cancer (with both ductal and lobular features), grade 2, with negative margins  (3) adjuvant chemotherapy consisted of doxorubicin and cyclophosphamide in dose dense fashion 4, completed 12/30/2014, followed by paclitaxel weekly x 12 started 01/21/2015.  (a) paclitaxel stopped after 5 treatments due to persistent neuropathy, last dose 02/18/2015  (4) adjuvant radiation given 04/18/2015-05/26/2015: Right chest wall/50.4 Pearline Cables @ 1.8 Pearline Cables per fraction x 28 fractions Right supraclavicular fossa/PAB 45 Gy @1 .8 Gy per fraction x 25 fractions Right scar boost / 10 [place] [employer] per fraction x 5 fractions  (5) started tamoxifen 07/07/2015--taken very irregularly  (a) s/p TAH-BSO 09/27/2015 with benign pathology  (b) bone density 01/27/2020 showed a  T score of -2.3  (6) history of celiac disease  (7) neuro psychologic testing 08/29/2016 identified  deficiencies on immediate auditory verbal memory span and verbal learning capacity, within the borderline impaired and low average ranges. 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 Anxiety and stress are seen as contributing to a worsening of her cognitive functioning but not as causing her cognitive difficulties. It is not known how long her cognitive difficulties might persist."  (8) empty sella by brain MRI 10/24/2016   PLAN: Amritha continues on tamoxifen when she remembers, and seems to be tolerating it well.  She has a constellation of chronic symptoms, and for that we will fully evaluate with PET scan.  If insurance denies this, then we will get CT chest/abd/pelvis with contrast, and she can forego the oral contrast.  She understands this.    Sherrika was recommended healthy diet and to stay as active as she can.  She had some labs drawn a week or two ago with her endocrinologist and the MCV was elevated, as well as her hematocrit was low with her lymphocyte percentage.  Today her labs are completely normal, and I believe she may have been exposed to a virus that her body was fighting off to cause these changes and then return to normal.  She understands this.  We will see Emina back for labs and f/u in 6 months.  She knows to call for any questions that may arise between now and her next appointment.  We are happy to see her sooner if needed.   Total encounter time 30 minutes.Wilber Bihari, NP 09/16/20 12:16 PM Medical Oncology and Hematology [facility] [place], [place] [address] Tel. [phone]    Fax. [phone]   *Total Encounter Time as defined by the Centers for Medicare and Medicaid Services includes, in addition to the face-to-face time of a patient visit (documented in the note above) non-face-to-face time: obtaining and reviewing outside history, ordering and reviewing  medications, tests or procedures, care coordination (communications with other health care professionals or caregivers) and documentation in the medical record.

## 2020-09-16 NOTE — Telephone Encounter (Signed)
Scheduled appointment per 9/24 los. Gave patient updated calendar print out.

## 2020-09-29 ENCOUNTER — Ambulatory Visit (HOSPITAL_COMMUNITY): Payer: Medicare HMO

## 2020-09-29 ENCOUNTER — Telehealth: Payer: Self-pay | Admitting: Oncology

## 2020-09-29 NOTE — Telephone Encounter (Signed)
Release: [id number] Faxed medical records to [facility] @ fax# [phone]

## 2020-10-06 ENCOUNTER — Ambulatory Visit (HOSPITAL_COMMUNITY): Payer: Medicare HMO

## 2020-10-06 ENCOUNTER — Telehealth (HOSPITAL_COMMUNITY): Payer: Self-pay | Admitting: Adult Health

## 2020-10-06 NOTE — Telephone Encounter (Signed)
Completed peer to Peer with Dr. Fara Olden for patient's insurance.  I explained the PET scan desire, and the symptoms that [name] had been having.  I explained the inability to tolerate oral contrast due to her celiac disease.  After thorough discussion, for about 20 minutes, I received approval for CT chest abdomen and pelvis with contrast Authorization #[id number] valid for 180 days.  Dr. Fara Olden also informed me that for this patient's insurance she could also receive MRI breast surveillance of her left breast since she was diagnosed with breast cancer before age 63.    Dr. Levi Aland and I spent about 20 minutes on the phone.    I called [name] and let her know about the insurance companies decision.  [name] told me that she thought it was ridiculous that it wasn't approved and it must've been the way that it is written up.  I explained to [name] that I called personally to appeal the denial and spoke with a physician and it was still denied.  I let her know that they recommended we do a CT scan first, and then can follow it with a PET scan if needed.  She told me that she didn't understand why she couldn't have a PET scan when other people can.  I let [name] know that everyone doesn't have the same situation or diagnosis.  I suggested that she call her insurance company to discuss the matter with them.    [name] would like to speak directly to Dr. Jana Hakim about the matter to see what he can do about this.    Misty Bihari, NP

## 2020-10-14 ENCOUNTER — Encounter (HOSPITAL_COMMUNITY): Payer: Medicare HMO

## 2020-10-21 ENCOUNTER — Other Ambulatory Visit: Payer: Self-pay | Admitting: Oncology

## 2020-10-25 ENCOUNTER — Other Ambulatory Visit: Payer: Self-pay

## 2020-10-25 ENCOUNTER — Ambulatory Visit (HOSPITAL_COMMUNITY)
Admission: RE | Admit: 2020-10-25 | Discharge: 2020-10-25 | Disposition: A | Discharge status: Home / Self Care | Payer: Medicare HMO | Source: Ambulatory Visit | Attending: Adult Health | Admitting: Adult Health

## 2020-10-25 DIAGNOSIS — C50511 Malignant neoplasm of lower-outer quadrant of right female breast: Secondary | ICD-10-CM | POA: Insufficient documentation

## 2020-10-25 LAB — GLUCOSE, CAPILLARY: Glucose-Capillary: 88 mg/dL (ref 70–99)

## 2020-10-25 MED ORDER — FLUDEOXYGLUCOSE F - 18 (FDG) INJECTION
5.6800 | Freq: Once | INTRAVENOUS | Status: AC
  Administered 2020-10-25: 5.68 via INTRAVENOUS

## 2020-10-27 ENCOUNTER — Other Ambulatory Visit: Payer: Self-pay | Admitting: Oncology

## 2020-10-27 NOTE — Progress Notes (Signed)
I called Misty [name] and gave her the results of the PET scan which to my reading are very favorable.  I reassured her that I believe those very small areas in the lung are all going to be benign.  They do need follow-up and we will repeat a CT of the chest in May of next year.  Of course she is already scheduled to see me in March.  She wants to make sure we do not "brushed off" any findings and I agree with her but I reassured her I really do believe these are going to be benign..  She understands there is no way to biopsy these at present without doing an open lung biopsy which she definitely does not want

## 2020-12-21 ENCOUNTER — Ambulatory Visit
Admission: RE | Admit: 2020-12-21 | Discharge: 2020-12-21 | Disposition: A | Discharge status: Home / Self Care | Payer: Medicare HMO | Source: Ambulatory Visit | Attending: Endocrinology | Admitting: Endocrinology

## 2020-12-21 DIAGNOSIS — E041 Nontoxic single thyroid nodule: Secondary | ICD-10-CM

## 2020-12-29 ENCOUNTER — Other Ambulatory Visit: Payer: Self-pay | Admitting: Oncology

## 2020-12-29 DIAGNOSIS — Z853 Personal history of malignant neoplasm of breast: Secondary | ICD-10-CM

## 2020-12-29 DIAGNOSIS — Z9011 Acquired absence of right breast and nipple: Secondary | ICD-10-CM

## 2021-01-18 ENCOUNTER — Other Ambulatory Visit: Payer: Self-pay | Admitting: Endocrinology

## 2021-01-18 DIAGNOSIS — E041 Nontoxic single thyroid nodule: Secondary | ICD-10-CM

## 2021-02-14 ENCOUNTER — Other Ambulatory Visit: Payer: Self-pay | Admitting: Oncology

## 2021-02-14 DIAGNOSIS — Z9011 Acquired absence of right breast and nipple: Secondary | ICD-10-CM

## 2021-02-14 DIAGNOSIS — Z1231 Encounter for screening mammogram for malignant neoplasm of breast: Secondary | ICD-10-CM

## 2021-02-14 DIAGNOSIS — Z853 Personal history of malignant neoplasm of breast: Secondary | ICD-10-CM

## 2021-02-17 ENCOUNTER — Other Ambulatory Visit: Payer: Self-pay

## 2021-02-17 ENCOUNTER — Ambulatory Visit
Admission: RE | Admit: 2021-02-17 | Discharge: 2021-02-17 | Disposition: A | Discharge status: Home / Self Care | Payer: Medicare HMO | Source: Ambulatory Visit | Attending: Oncology | Admitting: Oncology

## 2021-02-17 DIAGNOSIS — Z853 Personal history of malignant neoplasm of breast: Secondary | ICD-10-CM

## 2021-02-17 DIAGNOSIS — Z1231 Encounter for screening mammogram for malignant neoplasm of breast: Secondary | ICD-10-CM

## 2021-02-17 DIAGNOSIS — Z9011 Acquired absence of right breast and nipple: Secondary | ICD-10-CM

## 2021-03-10 ENCOUNTER — Other Ambulatory Visit: Payer: Self-pay

## 2021-03-10 DIAGNOSIS — C50511 Malignant neoplasm of lower-outer quadrant of right female breast: Secondary | ICD-10-CM

## 2021-03-13 NOTE — Progress Notes (Signed)
[place]  [phone] [phone]     ID: Misty [name] DOB: 02-12-1976  MR#: [id number]  YPP#:[id number]  Patient Care Team: Misty Chroman, MD as PCP - General (Internal Medicine) Misty Skates, MD as Consulting Physician (General Surgery) [name], Misty Dad, MD as Consulting Physician (Oncology) Misty Ripa, PhD (Inactive) as Consulting Physician (Psychology) Misty Craver, MD as Consulting Physician (Gastroenterology) Misty Pi, MD as Referring Physician (Endocrinology) OTHER MD: Freda Munro MD (GYN) Misty [name] M.D. 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  Misty [name] has a difficult time with her memory and says that she takes tamoxifen when she remembers.    Since her last visit, she underwent PET scan on 10/25/2020 showing: no findings of hypermetabolic recurrent or metastatic disease; small bilateral pulmonary nodules, technically indeterminate.  She also underwent surveillance thyroid ultrasound on 12/21/2020 showing: similar-appearing multinodular goiter; three years of stability to 3 thyroid nodules. She is scheduled for her next annual surveillance scan on 12/22/2021.  She also underwent left screening mammography with tomography at [facility] on 02/17/2021 showing: breast density category C; no evidence of malignancy.    REVIEW OF SYSTEMS: Misty [name] continues to have headaches.  These are very variable in intensity but are fairly persistent.  She also gets occasional right pains in her right side which she knows are going to be postop.  She gets low back pain at times.  She does some laundry, although her husband has to carry the laundry basket for her.  She does very little cooking as she tends to leave the burners on.  She takes  little walks in the driveway at times.  There have been no recent falls.  A detailed review of systems was otherwise stable   COVID 19 VACCINATION STATUS: Refuses vaccination as her brother developed Guillain-Barr after a vaccine   BREAST CANCER HISTORY: From the original intake note:  Misty [name] was closely followed for some right breast changes between 20 11/12/2012. Everything seemed stable at that time. In 2014 she was promoted in her job, was working very hard, and actually missed having a mammogram. Late in January 2015 she noted some pain in her right breast and by February when she lifted her arms she noted that the nipple was tilting sideways and seemed a little bit of gathered. She didn't think much of this however. It was not until July that she noted a palpable mass in the lateral right breast. She brought this to her gynecologist's attention and she was set up for diagnostic mammography at [facility] 08/04/2014.   There was an area of distortion in the lateral aspect of the right breast noted on mammography,  corresponding to a palpable firm mass at the 8:00 position of the right breast. Ultrasound confirmed a 2.4 cm irregular hypoechoic spiculated mass in the right breast at the 8:00 position and in addition at the 5:00 position 1 cm from the nipple there was a taller than wide irregular hypoechoic mass measuring 0.8 cm. There was no right axillary adenopathy. In the left breast upper-outer quadrant there was a minimally complicated cyst measuring 0.7 cm.  On 08/11/2014 the patient underwent biopsy of the 2 right breast masses noted on ultrasound. Both showed invasive ductal carcinoma, grade 1, estrogen receptor 90% positive, progesterone receptor 90% positive, both with strong staining intensity, HER-2 equivocal  at 2+ but negative by FISH, with the signals ratio being 1.20 and the number per nucleus 2.1. The proliferation fraction by MIB-1 was 17% ([id number]; this report was reviewed  by our pathologist, who concurred, [name] [id number]).  On 08/25/2014 the patient underwent bilateral breast MRI at [facility]. This showed, in the right breast, an irregular enhancing mass at the 8:00 position measuring 2.7 cm. Also in the right breast at the 6:00 position there was another enhancing mass measuring 0.6 cm, located 2 cm away from the larger mass. There were also masses in the retroareolar position measuring 0.7 cm, in the posterior central right breast measuring 0.5 cm, and another one just inferior to the midline measuring 5.5 cm. There were no masses or findings of concern in the left breast and no abnormal appearing lymph nodes.  The patient's subsequent history is as detailed below.   PAST MEDICAL HISTORY: Past Medical History:  Diagnosis Date  . Anxiety    situational  . Cancer ([place])    breast cancer  . Celiac disease   . Headache(784.0)   . Hypothyroidism   . PONV (postoperative nausea and vomiting)   . Radiation 04/18/15-05/26/15   right breast  . Thyroid disease     PAST SURGICAL HISTORY: Past Surgical History:  Procedure Laterality Date  . CESAREAN SECTION    . CHOLECYSTECTOMY    . GALLBLADDER SURGERY    . LAPAROSCOPIC ASSISTED VAGINAL HYSTERECTOMY N/A 09/27/2015   Procedure: LAPAROSCOPIC ASSISTED VAGINAL HYSTERECTOMY;  Surgeon: Olga Millers, MD;  Location: [facility];  Service: Gynecology;  Laterality: N/A;  . LAPAROSCOPIC BILATERAL SALPINGO OOPHERECTOMY Bilateral 09/27/2015   Procedure: LAPAROSCOPIC BILATERAL SALPINGO OOPHORECTOMY;  Surgeon: Olga Millers, MD;  Location: [facility];  Service: Gynecology;  Laterality: Bilateral;  . MASTECTOMY Right    malignant  . PORT-A-CATH REMOVAL N/A 09/27/2015   Procedure: REMOVAL PORT-A-CATH;  Surgeon: Misty Skates, MD;  Location: [facility];  Service: General;  Laterality: N/A;  Porta Cath removed intact without defects  . PORTACATH PLACEMENT Right 10/22/2014   Procedure: INSERTION PORT-A-CATH;  Surgeon: Misty Skates,  MD;  Location: [facility];  Service: General;  Laterality: Right;  . SIMPLE MASTECTOMY WITH AXILLARY SENTINEL NODE BIOPSY Right 09/28/2014   Procedure: RIGHT TOTAL MASTECTOMY, RIGHT  AXILLARY SENTINEL NODE BIOPSY;  Surgeon: Misty Skates, MD;  Location: [facility];  Service: General;  Laterality: Right;    FAMILY HISTORY Family History  Problem Relation Age of Onset  . Skin cancer Father   . Breast cancer Maternal Grandmother        dx unknown age  . Breast cancer Other 33       mat great aunt through [facility] with breast cancer   the patient's parents are both living, in their late 31s. The patient had one brother, no sisters. The patient's maternal grandmother was diagnosed with breast cancer in her late 61s. One of that grandmother's sisters was also diagnosed with breast cancer, in her 77s. There is no other history of breast or ovarian cancer in the family   GYNECOLOGIC HISTORY:  Patient's last menstrual period was 09/21/2015. Menarche age 25, first live birth age 15, the patient is GX P1. She was still having regular periods at the start of chemotherapy, but this stopped early December 2015. She took oral contraceptives for approximately 9 years with no complications   SOCIAL HISTORY: (updated February 2021) Misty [name] worked as a Librarian, academic in the collections section of the [employer]  but she is on disability with the state. Her husband, Misty [name], worked as a Engineer, structural in [place] but he retired in 2016 and now runs a Building services engineer. Their son Misty [name] is 76 and very active in sports. The patient attends a local [place]: Not in place   HEALTH MAINTENANCE: Social History   Tobacco Use  . Smoking status: Never Smoker  Substance Use Topics  . Alcohol use: No    Alcohol/week: 0.0 standard drinks  . Drug use: No     Colonoscopy:  PAP: 07/29/2014  Bone density:  Lipid panel:  Allergies   Allergen Reactions  . Gluten Meal Other (See Comments)    Has celiac's disease  . Penicillins Nausea And Vomiting    Has patient had a PCN reaction causing immediate rash, facial/tongue/throat swelling, SOB or lightheadedness with hypotension: No Has patient had a PCN reaction causing severe rash involving mucus membranes or skin necrosis: No Has patient had a PCN reaction that required hospitalization No Has patient had a PCN reaction occurring within the last 10 years: No If all of the above answers are "NO", then may proceed with Cephalosporin use. Son has had a severe reaction to it    Current Outpatient Medications  Medication Sig Dispense Refill  . SYNTHROID 75 MCG tablet Take 75 mcg by mouth every morning.    . tamoxifen (NOLVADEX) 20 MG tablet Take 1 tablet (20 mg total) by mouth daily. 90 tablet 4   No current facility-administered medications for this visit.    OBJECTIVE: white woman who appears stated age  53:   03/14/21 1031  BP: 116/66  Pulse: 78  Resp: 18  Temp: 98.1 F (36.7 C)  SpO2: 100%     Body mass index is 22.47 kg/m.    ECOG FS:2 - Symptomatic, <50% confined to bed  Sclerae unicteric, EOMs intact Wearing a mask No cervical or supraclavicular adenopathy Lungs no rales or rhonchi Heart regular rate and rhythm Abd soft, nontender, positive bowel sounds MSK no focal spinal tenderness, no upper extremity lymphedema Neuro: nonfocal, well oriented, appropriate affect Breasts: The right breast is status post mastectomy.  There is no evidence of local recurrence.  The left breast is benign.  Both axillae are benign   LAB RESULTS:  CMP     Component Value Date/Time   NA 142 03/14/2021 0917   NA 143 10/17/2016 0850   K 4.5 03/14/2021 0917   K 4.9 10/17/2016 0850   CL 103 03/14/2021 0917   CO2 29 03/14/2021 0917   CO2 28 10/17/2016 0850   GLUCOSE 83 03/14/2021 0917   GLUCOSE 98 10/17/2016 0850   BUN 19 03/14/2021 0917   BUN 17.0 10/17/2016  0850   CREATININE 0.75 03/14/2021 0917   CREATININE 0.7 10/17/2016 0850   CALCIUM 9.8 03/14/2021 0917   CALCIUM 10.2 10/17/2016 0850   PROT 7.4 03/14/2021 0917   PROT 7.5 10/17/2016 0850   ALBUMIN 4.3 03/14/2021 0917   ALBUMIN 4.0 10/17/2016 0850   AST 20 03/14/2021 0917   AST 21 10/17/2016 0850   ALT 15 03/14/2021 0917   ALT 16 10/17/2016 0850   ALKPHOS 77 03/14/2021 0917   ALKPHOS 107 10/17/2016 0850   BILITOT 0.7 03/14/2021 0917   BILITOT 0.44 10/17/2016 0850   GFRNONAA >60 03/14/2021 0917   GFRAA >60 09/16/2020 1144    I No results found for: SPEP  Lab Results  Component Value Date  WBC 4.1 03/14/2021   NEUTROABS 2.6 03/14/2021   HGB 13.3 03/14/2021   HCT 39.6 03/14/2021   MCV 91.9 03/14/2021   PLT 178 03/14/2021      Chemistry      Component Value Date/Time   NA 142 03/14/2021 0917   NA 143 10/17/2016 0850   K 4.5 03/14/2021 0917   K 4.9 10/17/2016 0850   CL 103 03/14/2021 0917   CO2 29 03/14/2021 0917   CO2 28 10/17/2016 0850   BUN 19 03/14/2021 0917   BUN 17.0 10/17/2016 0850   CREATININE 0.75 03/14/2021 0917   CREATININE 0.7 10/17/2016 0850      Component Value Date/Time   CALCIUM 9.8 03/14/2021 0917   CALCIUM 10.2 10/17/2016 0850   ALKPHOS 77 03/14/2021 0917   ALKPHOS 107 10/17/2016 0850   AST 20 03/14/2021 0917   AST 21 10/17/2016 0850   ALT 15 03/14/2021 0917   ALT 16 10/17/2016 0850   BILITOT 0.7 03/14/2021 0917   BILITOT 0.44 10/17/2016 0850      No results found for: LABCA2  No components found for: [id number]  No results for input(s): INR in the last 168 hours.  Urinalysis No results found for: COLORURINE   STUDIES: MM 3D SCREEN BREAST UNI LEFT  Result Date: 02/21/2021 CLINICAL DATA:  Screening. EXAM: DIGITAL SCREENING UNILATERAL LEFT MAMMOGRAM WITH CAD AND TOMOSYNTHESIS TECHNIQUE: Left screening digital craniocaudal and mediolateral oblique mammograms were obtained. Left screening digital breast tomosynthesis was performed. The  images were evaluated with computer-aided detection. COMPARISON:  Previous exam(s). ACR Breast Density Category c: The breast tissue is heterogeneously dense, which may obscure small masses. FINDINGS: There are no findings suspicious for malignancy. The images were evaluated with computer-aided detection. IMPRESSION: No mammographic evidence of malignancy. A result letter of this screening mammogram will be mailed directly to the patient. RECOMMENDATION: Screening mammogram in one year. (Code:SM-B-01Y) BI-RADS CATEGORY  1: Negative. Electronically Signed   By: Lajean Manes M.D.   On: 02/21/2021 08:13     ASSESSMENT: 45 y.o. BRCA negative [place], [place] woman status post biopsy of 2 separate right breast lower outer quadrant masses 08/11/2014, both positive for an invasive ductal carcinoma, grade 1, estrogen and progesterone receptor strongly positive, with an MIB-1 of 19%, and HER-2 nonamplified  (1) genetics testing sent 08/26/2014, showing no deleterious mutations in the BRCA genes; there were also no mutations in ATM, BARD 1, BRIP1, [place] 1, CHE K2, MR E11A, M UT YH, N BN, NF1, PA L B2, PTE N, RAD 50, RAD 50 1C, RAD 50 1D, and T p53.  (2) status post right mastectomy with sentinel lymph node sampling 09/28/2014 for an mpT2 pN1, stage IIB invasive breast cancer (with both ductal and lobular features), grade 2, with negative margins  (3) adjuvant chemotherapy consisted of doxorubicin and cyclophosphamide in dose dense fashion 4, completed 12/30/2014, followed by paclitaxel weekly x 12 started 01/21/2015.  (a) paclitaxel stopped after 5 treatments due to persistent neuropathy, last dose 02/18/2015  (4) adjuvant radiation given 04/18/2015-05/26/2015: Right chest wall/50.4 [place] Cables @ 1.8 [place] Cables per fraction x 28 fractions Right supraclavicular fossa/PAB 45 Gy _0 .8 Gy per fraction x 25 fractions Right scar boost / 10 [place] [employer] per fraction x 5 fractions  (5) started tamoxifen 07/07/2015--taken very  irregularly  (a) s/p TAH-BSO 09/27/2015 with benign pathology  (b) bone density 01/27/2020 showed a T score of -2.3  (6) history of celiac disease  (7) neuro psychologic testing 08/29/2016 identified deficiencies on  immediate auditory verbal memory span and verbal learning capacity, within the borderline impaired and low average ranges. 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 Anxiety and stress are seen as contributing to a worsening of her cognitive functioning but not as causing her cognitive difficulties. It is not known how long her cognitive difficulties might persist."  (8) empty sella by brain MRI 10/24/2016   PLAN: Taelynn is now 6-1/2 years out from definitive surgery for her breast cancer with no evidence of disease recurrence.  This is very favorable.  She has been on tamoxifen more than 5 years.  Even though she has taken it irregularly, I think the benefit that she was going to obtain from the drug has been largely obtained and I am comfortable stopping it at this point.  I am also interested in whether any of her symptoms will improve off tamoxifen.  She does continue to have cognitive issues and physical issues as well as described above.  We are going to repeat a brain MRI to make sure there has been no change since the last 1 and to make sure the persistent headaches are not related to her cancer or other problems.  I am also going to obtain a CT scan of the chest to follow-up on the lung nodule seen on the recent PET scan  She will see me again in June.  At that point she may well "graduate" from follow-up or she may prefer to continue to see [place] through the survivorship program  Total encounter time 25 minutes.Sarajane Jews C. Magrinat, MD 03/14/21 10:41 AM Medical Oncology and Hematology [facility] [place], [place] [address] Tel. [phone]    Fax. [phone]   I, Wilburn Mylar, am acting as scribe for Dr. Virgie Dad. [name].  I, Lurline Del MD, have reviewed the above documentation for accuracy and completeness, and I agree with the above.    *Total Encounter Time as defined by the Centers for Medicare and Medicaid Services includes, in addition to the face-to-face time of a patient visit (documented in the note above) non-face-to-face time: obtaining and reviewing outside history, ordering and reviewing medications, tests or procedures, care coordination (communications with other health care professionals or caregivers) and documentation in the medical record.

## 2021-03-14 ENCOUNTER — Inpatient Hospital Stay (HOSPITAL_BASED_OUTPATIENT_CLINIC_OR_DEPARTMENT_OTHER): Payer: Medicare HMO | Admitting: Oncology

## 2021-03-14 ENCOUNTER — Inpatient Hospital Stay: Payer: Medicare HMO | Attending: Oncology

## 2021-03-14 ENCOUNTER — Other Ambulatory Visit: Payer: Self-pay

## 2021-03-14 VITALS — BP 116/66 | HR 78 | Temp 98.1°F | Resp 18 | Ht 61.0 in | Wt 118.9 lb

## 2021-03-14 DIAGNOSIS — F419 Anxiety disorder, unspecified: Secondary | ICD-10-CM | POA: Diagnosis not present

## 2021-03-14 DIAGNOSIS — C50511 Malignant neoplasm of lower-outer quadrant of right female breast: Secondary | ICD-10-CM | POA: Diagnosis present

## 2021-03-14 DIAGNOSIS — Z170 Estrogen receptor positive status [ER+]: Secondary | ICD-10-CM | POA: Insufficient documentation

## 2021-03-14 DIAGNOSIS — R918 Other nonspecific abnormal finding of lung field: Secondary | ICD-10-CM | POA: Insufficient documentation

## 2021-03-14 DIAGNOSIS — Z9011 Acquired absence of right breast and nipple: Secondary | ICD-10-CM | POA: Diagnosis not present

## 2021-03-14 DIAGNOSIS — Z9049 Acquired absence of other specified parts of digestive tract: Secondary | ICD-10-CM | POA: Diagnosis not present

## 2021-03-14 DIAGNOSIS — Z90722 Acquired absence of ovaries, bilateral: Secondary | ICD-10-CM | POA: Insufficient documentation

## 2021-03-14 DIAGNOSIS — Z79810 Long term (current) use of selective estrogen receptor modulators (SERMs): Secondary | ICD-10-CM | POA: Insufficient documentation

## 2021-03-14 DIAGNOSIS — Z9221 Personal history of antineoplastic chemotherapy: Secondary | ICD-10-CM | POA: Diagnosis not present

## 2021-03-14 DIAGNOSIS — R519 Headache, unspecified: Secondary | ICD-10-CM

## 2021-03-14 DIAGNOSIS — Z79899 Other long term (current) drug therapy: Secondary | ICD-10-CM | POA: Insufficient documentation

## 2021-03-14 DIAGNOSIS — Z803 Family history of malignant neoplasm of breast: Secondary | ICD-10-CM | POA: Diagnosis not present

## 2021-03-14 DIAGNOSIS — E236 Other disorders of pituitary gland: Secondary | ICD-10-CM | POA: Diagnosis not present

## 2021-03-14 DIAGNOSIS — R4189 Other symptoms and signs involving cognitive functions and awareness: Secondary | ICD-10-CM | POA: Insufficient documentation

## 2021-03-14 DIAGNOSIS — Z923 Personal history of irradiation: Secondary | ICD-10-CM | POA: Insufficient documentation

## 2021-03-14 LAB — CBC WITH DIFFERENTIAL (CANCER CENTER ONLY)
Abs Immature Granulocytes: 0.01 10*3/uL (ref 0.00–0.07)
Basophils Absolute: 0 10*3/uL (ref 0.0–0.1)
Basophils Relative: 1 %
Eosinophils Absolute: 0.2 10*3/uL (ref 0.0–0.5)
Eosinophils Relative: 4 %
HCT: 39.6 % (ref 36.0–46.0)
Hemoglobin: 13.3 g/dL (ref 12.0–15.0)
Immature Granulocytes: 0 %
Lymphocytes Relative: 25 %
Lymphs Abs: 1 10*3/uL (ref 0.7–4.0)
MCH: 30.9 pg (ref 26.0–34.0)
MCHC: 33.6 g/dL (ref 30.0–36.0)
MCV: 91.9 fL (ref 80.0–100.0)
Monocytes Absolute: 0.3 10*3/uL (ref 0.1–1.0)
Monocytes Relative: 7 %
Neutro Abs: 2.6 10*3/uL (ref 1.7–7.7)
Neutrophils Relative %: 63 %
Platelet Count: 178 10*3/uL (ref 150–400)
RBC: 4.31 MIL/uL (ref 3.87–5.11)
RDW: 12.9 % (ref 11.5–15.5)
WBC Count: 4.1 10*3/uL (ref 4.0–10.5)
nRBC: 0 % (ref 0.0–0.2)

## 2021-03-14 LAB — CMP (CANCER CENTER ONLY)
ALT: 15 U/L (ref 0–44)
AST: 20 U/L (ref 15–41)
Albumin: 4.3 g/dL (ref 3.5–5.0)
Alkaline Phosphatase: 77 U/L (ref 38–126)
Anion gap: 10 (ref 5–15)
BUN: 19 mg/dL (ref 6–20)
CO2: 29 mmol/L (ref 22–32)
Calcium: 9.8 mg/dL (ref 8.9–10.3)
Chloride: 103 mmol/L (ref 98–111)
Creatinine: 0.75 mg/dL (ref 0.44–1.00)
GFR, Estimated: >60 mL/min (ref >60)
Glucose, Bld: 83 mg/dL (ref 70–99)
Potassium: 4.5 mmol/L (ref 3.5–5.1)
Sodium: 142 mmol/L (ref 135–145)
Total Bilirubin: 0.7 mg/dL (ref 0.3–1.2)
Total Protein: 7.4 g/dL (ref 6.5–8.1)

## 2021-03-15 ENCOUNTER — Telehealth: Payer: Self-pay | Admitting: Oncology

## 2021-03-15 NOTE — Telephone Encounter (Signed)
Scheduled appts per 3/22 los. Pt aware. Pt requested appt. In July.

## 2021-06-05 ENCOUNTER — Other Ambulatory Visit: Payer: Self-pay

## 2021-06-05 ENCOUNTER — Ambulatory Visit (HOSPITAL_COMMUNITY)
Admission: RE | Admit: 2021-06-05 | Discharge: 2021-06-05 | Disposition: A | Discharge status: Home / Self Care | Payer: Medicare HMO | Source: Ambulatory Visit | Attending: Oncology | Admitting: Oncology

## 2021-06-05 DIAGNOSIS — C50511 Malignant neoplasm of lower-outer quadrant of right female breast: Secondary | ICD-10-CM | POA: Diagnosis present

## 2021-06-05 DIAGNOSIS — E236 Other disorders of pituitary gland: Secondary | ICD-10-CM | POA: Diagnosis present

## 2021-06-05 DIAGNOSIS — R519 Headache, unspecified: Secondary | ICD-10-CM | POA: Insufficient documentation

## 2021-06-05 MED ORDER — GADOBUTROL 1 MMOL/ML IV SOLN
5.0000 mL | Freq: Once | INTRAVENOUS | Status: AC | PRN
  Administered 2021-06-05: 09:00:00 5 mL via INTRAVENOUS

## 2021-06-07 ENCOUNTER — Other Ambulatory Visit: Payer: Self-pay

## 2021-06-07 ENCOUNTER — Ambulatory Visit (HOSPITAL_COMMUNITY)
Admission: RE | Admit: 2021-06-07 | Discharge: 2021-06-07 | Disposition: A | Discharge status: Home / Self Care | Payer: Medicare HMO | Source: Ambulatory Visit | Attending: Oncology | Admitting: Oncology

## 2021-06-07 DIAGNOSIS — R519 Headache, unspecified: Secondary | ICD-10-CM | POA: Diagnosis present

## 2021-06-07 DIAGNOSIS — C50511 Malignant neoplasm of lower-outer quadrant of right female breast: Secondary | ICD-10-CM | POA: Insufficient documentation

## 2021-06-07 DIAGNOSIS — E236 Other disorders of pituitary gland: Secondary | ICD-10-CM | POA: Diagnosis present

## 2021-06-07 MED ORDER — SODIUM CHLORIDE (PF) 0.9 % IJ SOLN
INTRAMUSCULAR | Status: AC
  Filled 2021-06-07: qty 50

## 2021-06-07 MED ORDER — IOHEXOL 300 MG/ML  SOLN
75.0000 mL | Freq: Once | INTRAMUSCULAR | Status: AC | PRN
  Administered 2021-06-07: 09:00:00 75 mL via INTRAVENOUS

## 2021-06-19 ENCOUNTER — Other Ambulatory Visit: Payer: Medicare HMO

## 2021-06-19 ENCOUNTER — Ambulatory Visit: Payer: Medicare HMO | Admitting: Oncology

## 2021-07-05 ENCOUNTER — Other Ambulatory Visit: Payer: Self-pay

## 2021-07-05 DIAGNOSIS — C50511 Malignant neoplasm of lower-outer quadrant of right female breast: Secondary | ICD-10-CM

## 2021-07-05 NOTE — Progress Notes (Signed)
[place]  [phone] [phone]     ID: Kamora Vossler DOB: 10-14-1976  MR#: [id number]  JYN#:[id number]  Patient Care Team: Glenda Chroman, MD as PCP - General (Internal Medicine) Jamey Ripa, PhD (Inactive) as Consulting Physician (Psychology) Juanita Craver, MD as Consulting Physician (Gastroenterology) Jacelyn Pi, MD as Referring Physician (Endocrinology) OTHER MD: Freda Munro MD (GYN) Theodoro Kos M.D. 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 She is now under observation.  She is accompanied by her husband Tynisa Vohs has now been off tamoxifen since her last visit on 03/14/2021.  Clinically she is pretty much the same.  Since her last visit, she underwent brain MRI on 06/05/2021 showing: no enhancing lesion to suggest metastatic disease; partial empty sella, improved from prior.  She also underwent chest CT on 06/07/2021 showing: stable small scattered subpleural nodules, likely lymph nodes; no findings suspicious for pulmonary metastatic disease; no mediastinal or hilar mass or adenopathy; no findings for upper abdominal metastatic disease or osseous metastatic disease.  She is up to date on left screening mammogram, most recently 02/17/2021.  Of note, she is scheduled for thyroid ultrasound on 12/22/2021.   REVIEW OF SYSTEMS: Malva tells me she frequently drops things.  This is not a new problem.  She has had no balance issues or falls.  She is not exercising regularly.  She says she has little energy and gets easily short of breath.  She recently saw Dr. Chalmers Cater and was started on vitamin D.  A detailed review of systems today was otherwise stable   COVID 19 VACCINATION STATUS: Refuses vaccination as her brother developed Guillain-Barr  after a vaccine   BREAST CANCER HISTORY: From the original intake note:  Orly was closely followed for some right breast changes between 20 11/12/2012. Everything seemed stable at that time. In 2014 she was promoted in her job, was working very hard, and actually missed having a mammogram. Late in January 2015 she noted some pain in her right breast and by February when she lifted her arms she noted that the nipple was tilting sideways and seemed a little bit of gathered. She didn't think much of this however. It was not until July that she noted a palpable mass in the lateral right breast. She brought this to her gynecologist's attention and she was set up for diagnostic mammography at [facility] 08/04/2014.   There was an area of distortion in the lateral aspect of the right breast noted on mammography,  corresponding to a palpable firm mass at the 8:00 position of the right breast. Ultrasound confirmed a 2.4 cm irregular hypoechoic spiculated mass in the right breast at the 8:00 position and in addition at the 5:00 position 1 cm from the nipple there was a taller than wide irregular hypoechoic mass measuring 0.8 cm. There was no right axillary adenopathy. In the left breast upper-outer quadrant there was a minimally complicated cyst measuring 0.7 cm.  On 08/11/2014 the patient underwent biopsy of the 2 right breast masses noted on ultrasound. 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 The proliferation fraction by MIB-1 was 17% ([id number]; this report  was reviewed by our pathologist, who concurred, [name] [phone]).  On 08/25/2014 the patient underwent bilateral breast MRI at [place] [facility]. This showed, in the right breast, an irregular enhancing mass at the 8:00 position  measuring 2.7 cm. Also in the right breast at the 6:00 position there was another enhancing mass measuring 0.6 cm, located 2 cm away from the larger mass. There were also masses in the retroareolar position measuring 0.7 cm, in the posterior central right breast measuring 0.5 cm, and another one just inferior to the midline measuring 5.5 cm. There were no masses or findings of concern in the left breast and no abnormal appearing lymph nodes.  The patient's subsequent history is as detailed below.   PAST MEDICAL HISTORY: Past Medical History:  Diagnosis Date   Anxiety    situational   Cancer ([place])    breast cancer   Celiac disease    Headache(784.0)    Hypothyroidism    PONV (postoperative nausea and vomiting)    Radiation 04/18/15-05/26/15   right breast   Thyroid disease     PAST SURGICAL HISTORY: Past Surgical History:  Procedure Laterality Date   CESAREAN SECTION     CHOLECYSTECTOMY     GALLBLADDER SURGERY     LAPAROSCOPIC ASSISTED VAGINAL HYSTERECTOMY N/A 09/27/2015   Procedure: LAPAROSCOPIC ASSISTED VAGINAL HYSTERECTOMY;  Surgeon: Olga Millers, MD;  Location: [facility];  Service: Gynecology;  Laterality: N/A;   LAPAROSCOPIC BILATERAL SALPINGO OOPHERECTOMY Bilateral 09/27/2015   Procedure: LAPAROSCOPIC BILATERAL SALPINGO OOPHORECTOMY;  Surgeon: Olga Millers, MD;  Location: [facility];  Service: Gynecology;  Laterality: Bilateral;   MASTECTOMY Right    malignant   PORT-A-CATH REMOVAL N/A 09/27/2015   Procedure: REMOVAL PORT-A-CATH;  Surgeon: Fanny Skates, MD;  Location: [facility];  Service: General;  Laterality: N/A;  Porta Cath removed intact without defects   PORTACATH PLACEMENT Right 10/22/2014   Procedure: INSERTION PORT-A-CATH;  Surgeon: Fanny Skates, MD;  Location: [facility];  Service: General;  Laterality: Right;   SIMPLE MASTECTOMY WITH AXILLARY SENTINEL NODE BIOPSY Right 09/28/2014   Procedure: RIGHT TOTAL MASTECTOMY, RIGHT  AXILLARY SENTINEL NODE BIOPSY;   Surgeon: Fanny Skates, MD;  Location: [facility];  Service: General;  Laterality: Right;    FAMILY HISTORY Family History  Problem Relation Age of Onset   Skin cancer Father    Breast cancer Maternal Grandmother        dx unknown age   Breast cancer Other 60       mat great aunt through [facility] with breast cancer   the patient's parents are both living, in their late 63s. The patient had one brother, no sisters. The patient's maternal grandmother was diagnosed with breast cancer in her late 46s. One of that grandmother's sisters was also diagnosed with breast cancer, in her 58s. There is no other history of breast or ovarian cancer in the family   GYNECOLOGIC HISTORY:  Patient's last menstrual period was 09/21/2015. Menarche age 80, first live birth age 49, the patient is GX P1. She was still having regular periods at the start of chemotherapy, but this stopped early December 2015. She took oral contraceptives for approximately 9 years with no complications   SOCIAL HISTORY: (updated July 2022) [name] worked as a Librarian, academic in the collections section of the [employer] but she is on disability with the state. Her husband, Herbie [place] "[name]" Aija Scarfo Junior, worked as a Engineer, structural in [place] but he retired in 2016 and now  runs a gardening service. Their son [name] is 53 and is planning to study criminology and become a [place] [employer].  The patient attends a local [place]: In the absence of any documents to the contrary the patient's husband is her healthcare power of attorney   HEALTH MAINTENANCE: Social History   Tobacco Use   Smoking status: Never  Substance Use Topics   Alcohol use: No    Alcohol/week: 0.0 standard drinks   Drug use: No     Colonoscopy:  PAP: 07/29/2014  Bone density:  Lipid panel:  Allergies  Allergen Reactions   Gluten Meal Other (See Comments)    Has celiac's disease   Penicillins Nausea And Vomiting     Has patient had a PCN reaction causing immediate rash, facial/tongue/throat swelling, SOB or lightheadedness with hypotension: No Has patient had a PCN reaction causing severe rash involving mucus membranes or skin necrosis: No Has patient had a PCN reaction that required hospitalization No Has patient had a PCN reaction occurring within the last 10 years: No If all of the above answers are "NO", then may proceed with Cephalosporin use. Son has had a severe reaction to it    Current Outpatient Medications  Medication Sig Dispense Refill   SYNTHROID 75 MCG tablet Take 75 mcg by mouth every morning.     tamoxifen (NOLVADEX) 20 MG tablet Take 1 tablet (20 mg total) by mouth daily. 90 tablet 4   No current facility-administered medications for this visit.    OBJECTIVE: white woman who appears stated age  51:   07/06/21 0901  BP: 113/61  Pulse: 72  Resp: 18  Temp: (!) 97.3 F (36.3 C)  SpO2: 100%     Body mass index is 22.58 kg/m.    ECOG FS:1 - Symptomatic but completely ambulatory  Sclerae unicteric, EOMs intact Wearing a mask No cervical or supraclavicular adenopathy Lungs no rales or rhonchi Heart regular rate and rhythm Abd soft, nontender, positive bowel sounds MSK no focal spinal tenderness, no upper extremity lymphedema Neuro: nonfocal, well oriented, appropriate affect Breasts: The right breast is status post mastectomy with no evidence of disease recurrence.  Left breast is benign.  Both axillae are benign.   LAB RESULTS:  CMP     Component Value Date/Time   NA 143 07/06/2021 0849   NA 143 10/17/2016 0850   K 5.0 07/06/2021 0849   K 4.9 10/17/2016 0850   CL 106 07/06/2021 0849   CO2 29 07/06/2021 0849   CO2 28 10/17/2016 0850   GLUCOSE 84 07/06/2021 0849   GLUCOSE 98 10/17/2016 0850   BUN 14 07/06/2021 0849   BUN 17.0 10/17/2016 0850   CREATININE 0.72 07/06/2021 0849   CREATININE 0.7 10/17/2016 0850   CALCIUM 9.9 07/06/2021 0849   CALCIUM 10.2  10/17/2016 0850   PROT 7.2 07/06/2021 0849   PROT 7.5 10/17/2016 0850   ALBUMIN 3.9 07/06/2021 0849   ALBUMIN 4.0 10/17/2016 0850   AST 19 07/06/2021 0849   AST 21 10/17/2016 0850   ALT 15 07/06/2021 0849   ALT 16 10/17/2016 0850   ALKPHOS 67 07/06/2021 0849   ALKPHOS 107 10/17/2016 0850   BILITOT 0.4 07/06/2021 0849   BILITOT 0.44 10/17/2016 0850   GFRNONAA >60 07/06/2021 0849   GFRAA >60 09/16/2020 1144    I No results found for: SPEP  Lab Results  Component Value Date   WBC 4.9 07/06/2021   NEUTROABS 3.1 07/06/2021  HGB 13.0 07/06/2021   HCT 39.2 07/06/2021   MCV 93.6 07/06/2021   PLT 172 07/06/2021      Chemistry      Component Value Date/Time   NA 143 07/06/2021 0849   NA 143 10/17/2016 0850   K 5.0 07/06/2021 0849   K 4.9 10/17/2016 0850   CL 106 07/06/2021 0849   CO2 29 07/06/2021 0849   CO2 28 10/17/2016 0850   BUN 14 07/06/2021 0849   BUN 17.0 10/17/2016 0850   CREATININE 0.72 07/06/2021 0849   CREATININE 0.7 10/17/2016 0850      Component Value Date/Time   CALCIUM 9.9 07/06/2021 0849   CALCIUM 10.2 10/17/2016 0850   ALKPHOS 67 07/06/2021 0849   ALKPHOS 107 10/17/2016 0850   AST 19 07/06/2021 0849   AST 21 10/17/2016 0850   ALT 15 07/06/2021 0849   ALT 16 10/17/2016 0850   BILITOT 0.4 07/06/2021 0849   BILITOT 0.44 10/17/2016 0850      No results found for: LABCA2  No components found for: IOEVO350  No results for input(s): INR in the last 168 hours.  Urinalysis No results found for: COLORURINE   STUDIES: CT Chest W Contrast  Result Date: 06/07/2021 CLINICAL DATA:  History of breast cancer. Follow-up pulmonary nodules seen on prior PET-CT. EXAM: CT CHEST WITH CONTRAST TECHNIQUE: Multidetector CT imaging of the chest was performed during intravenous contrast administration. CONTRAST:  93m OMNIPAQUE IOHEXOL 300 MG/ML  SOLN COMPARISON:  PET-CT 10/25/2020 FINDINGS: Cardiovascular: The heart is normal in size. No pericardial effusion. The  aorta is normal in caliber. No dissection. No atherosclerotic calcifications. No coronary artery calcifications. Mediastinum/Nodes: No mediastinal or hilar mass or lymphadenopathy. There is some residual thymic tissue noted in the anterior mediastinum which is stable. The esophagus is grossly. Lungs/Pleura: A few scattered small subpleural nodules are stable and likely lymph nodes. No worrisome pulmonary lesions for findings suspicious for pulmonary metastatic disease. Stable apical pleural thickening on the right side. No pleural effusions or pleural nodules. Upper Abdomen: No significant upper abdominal findings. No upper abdominal metastatic disease. The gallbladder is surgically absent. No biliary dilatation. Musculoskeletal: Status post right mastectomy. No left breast masses. No supraclavicular or axillary adenopathy the. The bony thorax is intact. No worrisome lytic or sclerotic bone lesions to suggest metastatic disease. IMPRESSION: 1. Stable small scattered subpleural nodules, likely lymph nodes. No findings suspicious for pulmonary metastatic disease. Attention on future scans is suggested. 2. No mediastinal or hilar mass or adenopathy. 3. No findings for upper abdominal metastatic disease. 4. Status post right mastectomy. No findings for osseous metastatic disease. 5. Aortic atherosclerosis. Aortic Atherosclerosis (ICD10-I70.0). Electronically Signed   By: PMarijo SanesM.D.   On: 06/07/2021 13:14      ASSESSMENT: 45y.o. BRCA negative [place], [place] status post biopsy of 2 separate right breast lower outer quadrant masses 08/11/2014, both positive for an invasive ductal carcinoma, grade 1, estrogen and progesterone receptor strongly positive, with an MIB-1 of 19%, and HER-2 nonamplified  (1) genetics testing sent 08/26/2014, showing no deleterious mutations in the BRCA genes; there were also no mutations in ATM, BARD 1, BRIP1, CDH 1, CHE K2, MR E11A, M UT YH, N BN, NF1, PA L B2, PTE N, RAD 50,  RAD 50 1C, RAD 50 1D, and T p53.  (2) status post right mastectomy with sentinel lymph node sampling 09/28/2014 for an mpT2 pN1, stage IIB invasive breast cancer (with both ductal and lobular features), grade 2, with  negative margins  (3) adjuvant chemotherapy consisted of doxorubicin and cyclophosphamide in dose dense fashion 4, completed 12/30/2014, followed by paclitaxel weekly x 12 started 01/21/2015.  (a) paclitaxel stopped after 5 treatments due to persistent neuropathy, last dose 02/18/2015  (4) adjuvant radiation given 04/18/2015-05/26/2015: Right chest wall/50.4 Pearline Cables @ 1.8 Pearline Cables per fraction x 28 fractions Right supraclavicular fossa/PAB 45 Gy _0 .8 Gy per fraction x 25 fractions Right scar boost / 10 [employer] per fraction x 5 fractions  (5) started tamoxifen 07/07/2015--taken very irregularly, stopped 02/2021  (a) s/p TAH-BSO 09/27/2015 with benign pathology  (b) bone density 01/27/2020 showed a T score of -2.3  (6) history of celiac disease  (7) neuro psychologic testing 08/29/2016 identified deficiencies on immediate auditory verbal memory span and verbal learning capacity, within the borderline impaired and low average ranges. 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 Anxiety and stress are seen as contributing to a worsening of her cognitive functioning but not as causing her cognitive difficulties. It is not known how long her cognitive difficulties might persist."  (8) empty sella by brain MRI 10/24/2016  (A) repeat brain MRI 06/05/2021 shows stable to improved disease   PLAN: Marirose is now just about 7 years out from definitive surgery for her breast cancer with no evidence of disease recurrence.  This is very favorable.  Today we discussed "graduating" from cancer follow-up, but Albie has multiple concerns.  First of all she is worried about the lung nodules.  We discussed issues regarding registration  and how very small nodules can appear and disappear or look slightly larger or smaller simply because of registration issues.  We have set her up for repeat CT of the chest June 2023 to follow-up on this.  She was also concerned about the finding of atherosclerosis.  She understands this is extremely common pretty much unavoidable given our diet and exercise patterns in the [place],.  My recommendation was that she consider diet and exercise program under her primary care doctor but she is sufficiently worried because of his significant family history of heart disease that we are placing a cardiology referral to Dr. Aundra Dubin and hopefully he will be able to see the patient in the next month or 2 to do a more thorough cardiology assessment.  Otherwise we will see trace in a year after her repeat chest CT scan and peds left mammography.  At that point she might consider going on our survivorship program.  Total encounter time 25 minutes.Sarajane Jews C. Ekam Bonebrake, MD 07/06/21 9:49 AM Medical Oncology and Hematology [facility] [place], [place] [address] Tel. [phone]    Fax. [phone]   I, Wilburn Mylar, am acting as scribe for Dr. Virgie Dad. [place] Spees.  I, Lurline Del MD, have reviewed the above documentation for accuracy and completeness, and I agree with the above.   *Total Encounter Time as defined by the Centers for Medicare and Medicaid Services includes, in addition to the face-to-face time of a patient visit (documented in the note above) non-face-to-face time: obtaining and reviewing outside history, ordering and reviewing medications, tests or procedures, care coordination (communications with other health care professionals or caregivers) and documentation in the medical record.

## 2021-07-06 ENCOUNTER — Other Ambulatory Visit: Payer: Self-pay

## 2021-07-06 ENCOUNTER — Inpatient Hospital Stay: Payer: Medicare HMO | Attending: Oncology

## 2021-07-06 ENCOUNTER — Inpatient Hospital Stay (HOSPITAL_BASED_OUTPATIENT_CLINIC_OR_DEPARTMENT_OTHER): Payer: Medicare HMO | Admitting: Oncology

## 2021-07-06 VITALS — BP 113/61 | HR 72 | Temp 97.3°F | Resp 18 | Ht 61.0 in | Wt 119.5 lb

## 2021-07-06 DIAGNOSIS — Z923 Personal history of irradiation: Secondary | ICD-10-CM | POA: Insufficient documentation

## 2021-07-06 DIAGNOSIS — Z9221 Personal history of antineoplastic chemotherapy: Secondary | ICD-10-CM | POA: Diagnosis not present

## 2021-07-06 DIAGNOSIS — Z9011 Acquired absence of right breast and nipple: Secondary | ICD-10-CM | POA: Insufficient documentation

## 2021-07-06 DIAGNOSIS — R911 Solitary pulmonary nodule: Secondary | ICD-10-CM | POA: Insufficient documentation

## 2021-07-06 DIAGNOSIS — C50511 Malignant neoplasm of lower-outer quadrant of right female breast: Secondary | ICD-10-CM

## 2021-07-06 DIAGNOSIS — Z853 Personal history of malignant neoplasm of breast: Secondary | ICD-10-CM | POA: Insufficient documentation

## 2021-07-06 DIAGNOSIS — I700 Atherosclerosis of aorta: Secondary | ICD-10-CM | POA: Insufficient documentation

## 2021-07-06 LAB — CMP (CANCER CENTER ONLY)
ALT: 15 U/L (ref 0–44)
AST: 19 U/L (ref 15–41)
Albumin: 3.9 g/dL (ref 3.5–5.0)
Alkaline Phosphatase: 67 U/L (ref 38–126)
Anion gap: 8 (ref 5–15)
BUN: 14 mg/dL (ref 6–20)
CO2: 29 mmol/L (ref 22–32)
Calcium: 9.9 mg/dL (ref 8.9–10.3)
Chloride: 106 mmol/L (ref 98–111)
Creatinine: 0.72 mg/dL (ref 0.44–1.00)
GFR, Estimated: >60 mL/min (ref >60)
Glucose, Bld: 84 mg/dL (ref 70–99)
Potassium: 5 mmol/L (ref 3.5–5.1)
Sodium: 143 mmol/L (ref 135–145)
Total Bilirubin: 0.4 mg/dL (ref 0.3–1.2)
Total Protein: 7.2 g/dL (ref 6.5–8.1)

## 2021-07-06 LAB — CBC WITH DIFFERENTIAL (CANCER CENTER ONLY)
Abs Immature Granulocytes: 0.01 10*3/uL (ref 0.00–0.07)
Basophils Absolute: 0 10*3/uL (ref 0.0–0.1)
Basophils Relative: 1 %
Eosinophils Absolute: 0.3 10*3/uL (ref 0.0–0.5)
Eosinophils Relative: 6 %
HCT: 39.2 % (ref 36.0–46.0)
Hemoglobin: 13 g/dL (ref 12.0–15.0)
Immature Granulocytes: 0 %
Lymphocytes Relative: 21 %
Lymphs Abs: 1 10*3/uL (ref 0.7–4.0)
MCH: 31 pg (ref 26.0–34.0)
MCHC: 33.2 g/dL (ref 30.0–36.0)
MCV: 93.6 fL (ref 80.0–100.0)
Monocytes Absolute: 0.4 10*3/uL (ref 0.1–1.0)
Monocytes Relative: 8 %
Neutro Abs: 3.1 10*3/uL (ref 1.7–7.7)
Neutrophils Relative %: 64 %
Platelet Count: 172 10*3/uL (ref 150–400)
RBC: 4.19 MIL/uL (ref 3.87–5.11)
RDW: 13.1 % (ref 11.5–15.5)
WBC Count: 4.9 10*3/uL (ref 4.0–10.5)
nRBC: 0 % (ref 0.0–0.2)

## 2021-07-10 ENCOUNTER — Telehealth: Payer: Self-pay | Admitting: Oncology

## 2021-07-10 NOTE — Telephone Encounter (Signed)
Scheduled appointment per 07/14 los. Patient is aware.

## 2021-07-19 ENCOUNTER — Ambulatory Visit (INDEPENDENT_AMBULATORY_CARE_PROVIDER_SITE_OTHER): Payer: Medicare HMO | Admitting: Dermatology

## 2021-07-19 ENCOUNTER — Encounter: Payer: Self-pay | Admitting: Dermatology

## 2021-07-19 ENCOUNTER — Other Ambulatory Visit: Payer: Self-pay

## 2021-07-19 DIAGNOSIS — Z1283 Encounter for screening for malignant neoplasm of skin: Secondary | ICD-10-CM

## 2021-07-19 DIAGNOSIS — Z853 Personal history of malignant neoplasm of breast: Secondary | ICD-10-CM | POA: Diagnosis not present

## 2021-08-06 ENCOUNTER — Encounter: Payer: Self-pay | Admitting: Dermatology

## 2021-08-06 NOTE — Progress Notes (Signed)
   New Patient   Subjective  Misty [name] is a 45 y.o. female who presents for the following: Annual Exam (Husband with patient today robert no real concerns, no history of atypia or skin cancer ).  General skin examination Location:  Duration:  Quality:  Associated Signs/Symptoms: Modifying Factors:  Severity:  Timing: Context: History of breast cancer treated with chemotherapy.   The following portions of the chart were reviewed this encounter and updated as appropriate:  Tobacco  Allergies  Meds  Problems  Med Hx  Surg Hx  Fam Hx      Objective  Well appearing patient in no apparent distress; mood and affect are within normal limits. Head to Toe Head to toe exam today no signs of atypical moles, melanoma or non mole skin cancer.     A full examination was performed including scalp, head, eyes, ears, nose, lips, neck, chest, axillae, abdomen, back, buttocks, bilateral upper extremities, bilateral lower extremities, hands, feet, fingers, toes, fingernails, and toenails. All findings within normal limits unless otherwise noted below.  Areas beneath undergarments not fully examined.   Assessment & Plan  Skin exam for malignant neoplasm Head to Toe  Yearly skin check.  Encouraged to self examine twice annually, continue ultraviolet protection.

## 2021-10-02 ENCOUNTER — Ambulatory Visit (HOSPITAL_COMMUNITY)
Admission: RE | Admit: 2021-10-02 | Discharge: 2021-10-02 | Disposition: A | Discharge status: Home / Self Care | Payer: Medicare HMO | Source: Ambulatory Visit | Attending: Cardiology | Admitting: Cardiology

## 2021-10-02 ENCOUNTER — Other Ambulatory Visit: Payer: Self-pay

## 2021-10-02 VITALS — BP 138/50 | HR 80 | Ht 61.0 in | Wt 121.2 lb

## 2021-10-02 DIAGNOSIS — K900 Celiac disease: Secondary | ICD-10-CM | POA: Insufficient documentation

## 2021-10-02 DIAGNOSIS — E039 Hypothyroidism, unspecified: Secondary | ICD-10-CM | POA: Diagnosis not present

## 2021-10-02 DIAGNOSIS — Z8249 Family history of ischemic heart disease and other diseases of the circulatory system: Secondary | ICD-10-CM | POA: Diagnosis not present

## 2021-10-02 DIAGNOSIS — Z8489 Family history of other specified conditions: Secondary | ICD-10-CM | POA: Insufficient documentation

## 2021-10-02 DIAGNOSIS — I5022 Chronic systolic (congestive) heart failure: Secondary | ICD-10-CM

## 2021-10-02 DIAGNOSIS — Z853 Personal history of malignant neoplasm of breast: Secondary | ICD-10-CM | POA: Insufficient documentation

## 2021-10-02 DIAGNOSIS — Z79890 Hormone replacement therapy (postmenopausal): Secondary | ICD-10-CM | POA: Diagnosis not present

## 2021-10-02 DIAGNOSIS — R0789 Other chest pain: Secondary | ICD-10-CM | POA: Insufficient documentation

## 2021-10-02 DIAGNOSIS — Z9011 Acquired absence of right breast and nipple: Secondary | ICD-10-CM | POA: Insufficient documentation

## 2021-10-02 DIAGNOSIS — R079 Chest pain, unspecified: Secondary | ICD-10-CM

## 2021-10-02 DIAGNOSIS — Z79899 Other long term (current) drug therapy: Secondary | ICD-10-CM | POA: Insufficient documentation

## 2021-10-02 DIAGNOSIS — I700 Atherosclerosis of aorta: Secondary | ICD-10-CM | POA: Diagnosis not present

## 2021-10-02 LAB — LIPID PANEL
Cholesterol: 204 mg/dL — ABNORMAL HIGH (ref 0–200)
HDL: 75 mg/dL (ref >40)
LDL Cholesterol: 122 mg/dL — ABNORMAL HIGH (ref 0–99)
Total CHOL/HDL Ratio: 2.7 RATIO
Triglycerides: 34 mg/dL (ref <150)
VLDL: 7 mg/dL (ref 0–40)

## 2021-10-02 LAB — BASIC METABOLIC PANEL
Anion gap: 9 (ref 5–15)
BUN: 13 mg/dL (ref 6–20)
CO2: 25 mmol/L (ref 22–32)
Calcium: 9.6 mg/dL (ref 8.9–10.3)
Chloride: 106 mmol/L (ref 98–111)
Creatinine, Ser: 0.63 mg/dL (ref 0.44–1.00)
GFR, Estimated: >60 mL/min (ref >60)
Glucose, Bld: 87 mg/dL (ref 70–99)
Potassium: 4.3 mmol/L (ref 3.5–5.1)
Sodium: 140 mmol/L (ref 135–145)

## 2021-10-02 MED ORDER — METOPROLOL TARTRATE 25 MG PO TABS: ORAL_TABLET | ORAL | 0 refills | Status: DC

## 2021-10-02 NOTE — Patient Instructions (Addendum)
EKG done today.  Labs done today. We will contact you only if your labs are abnormal.  1 prior to your Coronary CTA  please take Metoprolol 8m (1 tablet) by mouth. This prescription was sent into the pharmacy for you.   No other medication changes were made. Please continue all current medications as prescribed.  Non-Cardiac CT Angiography (CTA), is a special type of CT scan that uses a computer to produce multi-dimensional views of major blood vessels throughout the body. In CT angiography, a contrast material is injected through an IV to help visualize the blood vessels. This has to be approved through your insurance company prior to scheduling, once approved, we will contact you to schedule an appointment. 1 prior to this exam please take Metoprolol 241m(1 tablet) by mouth. This prescription was sent into the pharmacy for you.   Your physician recommends that you schedule a follow-up appointment in: 1 month with an echo prior to your exam.   Your physician has requested that you have an echocardiogram. Echocardiography is a painless test that uses sound waves to create images of your heart. It provides your doctor with information about the size and shape of your heart and how well your heart's chambers and valves are working. This procedure takes approximately one hour. There are no restrictions for this procedure.  If you have any questions or concerns before your next appointment please send usK[place] message through myCarmenr call our office at [phone]   TO LEAVE A MESSAGE FOR THE NURSE SELECT OPTION 2, PLEASE LEAVE A MESSAGE INCLUDING: YOUR NAME DATE OF BIRTH CALL BACK NUMBER REASON FOR CALL**this is important as we prioritize the call backs  YOU WILL RECEIVE A CALL BACK THE SAME DAY AS LONG AS YOU CALL BEFORE 4:00 PM   Do the following things EVERYDAY: Weigh yourself in the morning before breakfast. Write it down and keep it in a log. Take your medicines as prescribed Eat  low salt foods--Limit salt (sodium) to 2000 mg per day.  Stay as active as you can everyday Limit all fluids for the day to less than 2 liters   At the [facility] and your health needs are our priority. As part of our continuing mission to provide you with exceptional heart care, we have created designated Provider Care Teams. These Care Teams include your primary Cardiologist (physician) and Advanced Practice Providers (APPs- Physician Assistants and Nurse Practitioners) who all work together to provide you with the care you need, when you need it.   You may see any of the following providers on your designated Care Team at your next follow up: Dr DaGlori Bickersr DaHaynes KernsNP BrLyda JesterPAUtahaAudry RilesPharmD   Please be sure to bring in all your medications bottles to every appointment.

## 2021-10-02 NOTE — Progress Notes (Signed)
PCP: Glenda Chroman, MD Oncology: Dr. Jana Hakim Cardiology: Dr. Aundra Dubin  45 y.o. with history of hypothyroidism and celiac disease was referred by Dr. Jana Hakim for evaluation of cardiac risk.  She has been concerned because she has a family history of cardiac disease, and CT chest showed aortic atherosclerosis.  She gets easily fatigued.  She gets occasional heaviness in her chest.  There is no trigger (not exertional), and it can last for a few minutes at a time. She has occasional palpitations.  She gets short of breath and tired if she walks fast. BP elevated today, but she says that it usually runs around 90/60 when she checks at home. She is a nonsmoker.   ECG (personally reviewed): NSR, normal.   PMH: 1. Hypothyroidism 2. Celiac disease 3. Empty sella syndrome 4. Breast cancer: Diagnosed 8/15, ER+/PR+/HER2- on right.  She was treated with right mastectomy, chemo with doxorubicin/cyclophosphamide and paclitaxel. She had radiation.  - Echo (1/18): EF 55-60%.   FH: Mother with TIA, heart murmur.  Grandmother with MI, CVA in her 30s.  Grandfather with MI in his 65s.  Aunt with heart valve replacement.   Social History   Socioeconomic History   Marital status: Married    Spouse name: Not on file   Number of children: 1   Years of education: Not on file   Highest education level: Not on file  Occupational History    Comment: [employer] [place]  Tobacco Use   Smoking status: Never   Smokeless tobacco: Never  Vaping Use   Vaping Use: Never used  Substance and Sexual Activity   Alcohol use: No    Alcohol/week: 0.0 standard drinks   Drug use: No   Sexual activity: Yes  Other Topics Concern   Not on file  Social History Narrative   Not on file   Social Determinants of Health   Financial Resource Strain: Not on file  Food Insecurity: Not on file  Transportation Needs: Not on file  Physical Activity: Not on file  Stress: Not on file  Social Connections: Not on file   Intimate Partner Violence: Not on file   ROS: All systems reviewed and negative except as per HPI.   Current Outpatient Medications  Medication Sig Dispense Refill   Cholecalciferol (VITAMIN D) 10 MCG/ML LIQD Taking 71ms by mouth daily     levothyroxine (SYNTHROID) 75 MCG tablet Take 1 tablet by mouth every morning.     metoprolol tartrate (LOPRESSOR) 25 MG tablet Take 1 tablet by mouth 1 hour prior to your coronary CTA 1 tablet 0   No current facility-administered medications for this encounter.   BP (!) 138/50   Pulse 80   Ht 5' 1"  (1.549 m)   Wt 55 kg (121 lb 3.2 oz)   LMP 09/21/2015   SpO2 100%   BMI 22.90 kg/m  General: NAD Neck: No JVD, no thyromegaly or thyroid nodule.  Lungs: Clear to auscultation bilaterally with normal respiratory effort. CV: Nondisplaced PMI.  Heart regular S1/S2, no S3/S4, no murmur.  No peripheral edema.  No carotid bruit.  Normal pedal pulses.  Abdomen: Soft, nontender, no hepatosplenomegaly, no distention.  Skin: Intact without lesions or rashes.  Neurologic: Alert and oriented x 3.  Psych: Normal affect. Extremities: No clubbing or cyanosis.  HEENT: Normal.   Assessment/Plan:  1. Chest heaviness/exertional dyspnea and fatigue: Atypical symptoms for cardiac ischemia.  However, she has a family history of vascular disease and was noted to have aortic atherosclerosis  on a CT.  - I will arrange for coronary CT angiogram to assess for obstructive CAD and to obtain a calcium score.  Will use this study to decide on use of statin (and what dose).  - Check lipids today.  2. History of doxorubicin: I will arrange for echo to assess LV systolic function given prior doxorubicin use.    Followup in 1 month to review coronary CT and echo.   Loralie Champagne 10/03/2021

## 2021-10-10 ENCOUNTER — Telehealth (HOSPITAL_COMMUNITY): Payer: Self-pay | Admitting: *Deleted

## 2021-10-10 NOTE — Telephone Encounter (Signed)
Reaching out to patient to offer assistance regarding upcoming cardiac imaging study; pt verbalizes understanding of appt date/time, parking situation and where to check in, pre-test NPO status and medications ordered, and verified current allergies; name and call back number provided for further questions should they arise  Gordy Clement RN Navigator [facility] [phone] office [phone] cell  Patient to take 55m metoprolol tartrate two hours prior to cardiac CT scan.

## 2021-10-11 ENCOUNTER — Ambulatory Visit (HOSPITAL_COMMUNITY)
Admission: RE | Admit: 2021-10-11 | Discharge: 2021-10-11 | Disposition: A | Discharge status: Home / Self Care | Payer: Medicare HMO | Source: Ambulatory Visit | Attending: Cardiology | Admitting: Cardiology

## 2021-10-11 ENCOUNTER — Other Ambulatory Visit: Payer: Self-pay

## 2021-10-11 DIAGNOSIS — Z8249 Family history of ischemic heart disease and other diseases of the circulatory system: Secondary | ICD-10-CM | POA: Insufficient documentation

## 2021-10-11 DIAGNOSIS — Z049 Encounter for examination and observation for unspecified reason: Secondary | ICD-10-CM | POA: Insufficient documentation

## 2021-10-11 DIAGNOSIS — R079 Chest pain, unspecified: Secondary | ICD-10-CM | POA: Diagnosis present

## 2021-10-11 MED ORDER — IOHEXOL 350 MG/ML SOLN
100.0000 mL | Freq: Once | INTRAVENOUS | Status: AC | PRN
  Administered 2021-10-11: 95 mL via INTRAVENOUS

## 2021-10-11 MED ORDER — NITROGLYCERIN 0.4 MG SL SUBL
SUBLINGUAL_TABLET | SUBLINGUAL | Status: AC
  Administered 2021-10-11: 0.8 mg via SUBLINGUAL
  Filled 2021-10-11: qty 2

## 2021-10-11 MED ORDER — METOPROLOL TARTRATE 5 MG/5ML IV SOLN: 5.0000 mg | INTRAVENOUS | Status: DC | PRN

## 2021-10-11 MED ORDER — NITROGLYCERIN 0.4 MG SL SUBL: 0.8000 mg | SUBLINGUAL_TABLET | Freq: Once | SUBLINGUAL | Status: AC

## 2021-10-11 MED ORDER — METOPROLOL TARTRATE 5 MG/5ML IV SOLN
INTRAVENOUS | Status: AC
  Filled 2021-10-11: qty 10

## 2021-10-11 MED ORDER — DILTIAZEM LOAD VIA INFUSION
5.0000 mg | INTRAVENOUS | Status: DC | PRN
  Filled 2021-10-11: qty 10

## 2021-11-06 ENCOUNTER — Other Ambulatory Visit: Payer: Self-pay

## 2021-11-06 ENCOUNTER — Encounter (HOSPITAL_COMMUNITY): Payer: Self-pay | Admitting: Cardiology

## 2021-11-06 ENCOUNTER — Ambulatory Visit (HOSPITAL_COMMUNITY)
Admission: RE | Admit: 2021-11-06 | Discharge: 2021-11-06 | Disposition: A | Discharge status: Home / Self Care | Payer: Medicare HMO | Source: Ambulatory Visit | Attending: Cardiology | Admitting: Cardiology

## 2021-11-06 ENCOUNTER — Ambulatory Visit (HOSPITAL_BASED_OUTPATIENT_CLINIC_OR_DEPARTMENT_OTHER)
Admission: RE | Admit: 2021-11-06 | Discharge: 2021-11-06 | Disposition: A | Discharge status: Home / Self Care | Payer: Medicare HMO | Source: Ambulatory Visit

## 2021-11-06 VITALS — BP 102/70 | HR 75 | Wt 122.2 lb

## 2021-11-06 DIAGNOSIS — R0609 Other forms of dyspnea: Secondary | ICD-10-CM | POA: Insufficient documentation

## 2021-11-06 DIAGNOSIS — R0789 Other chest pain: Secondary | ICD-10-CM | POA: Insufficient documentation

## 2021-11-06 DIAGNOSIS — Z923 Personal history of irradiation: Secondary | ICD-10-CM | POA: Insufficient documentation

## 2021-11-06 DIAGNOSIS — I5022 Chronic systolic (congestive) heart failure: Secondary | ICD-10-CM | POA: Insufficient documentation

## 2021-11-06 DIAGNOSIS — K900 Celiac disease: Secondary | ICD-10-CM | POA: Diagnosis not present

## 2021-11-06 DIAGNOSIS — Z8249 Family history of ischemic heart disease and other diseases of the circulatory system: Secondary | ICD-10-CM | POA: Insufficient documentation

## 2021-11-06 DIAGNOSIS — R079 Chest pain, unspecified: Secondary | ICD-10-CM

## 2021-11-06 DIAGNOSIS — I700 Atherosclerosis of aorta: Secondary | ICD-10-CM | POA: Insufficient documentation

## 2021-11-06 DIAGNOSIS — E039 Hypothyroidism, unspecified: Secondary | ICD-10-CM | POA: Diagnosis not present

## 2021-11-06 LAB — ECHOCARDIOGRAM COMPLETE
Area-P 1/2: 4.06 cm2
S' Lateral: 3.3 cm
Single Plane A2C EF: 61.6 %

## 2021-11-06 NOTE — Progress Notes (Signed)
  Echocardiogram 2D Echocardiogram has been performed.  Misty [name] 11/06/2021, 2:38 PM

## 2021-11-07 NOTE — Progress Notes (Signed)
PCP: Glenda Chroman, MD Oncology: Dr. Jana Hakim Cardiology: Dr. Aundra Dubin  45 y.o. with history of hypothyroidism and celiac disease was referred by Dr. Jana Hakim for evaluation of cardiac risk.  She has been concerned because she has a family history of cardiac disease, and CT chest showed aortic atherosclerosis.  She gets easily fatigued.  She gets occasional heaviness in her chest.  There is no trigger (not exertional), and it can last for a few minutes at a time. She has occasional palpitations.  She gets short of breath and tired if she walks fast. She is a nonsmoker.   Coronary CTA was done in 10/22, showing no significant coronary disease and CAC 0 Agatston units.  Echo in 10/22 was also normal.   Labs (10/22): LDL 122  PMH: 1. Hypothyroidism 2. Celiac disease 3. Empty sella syndrome 4. Breast cancer: Diagnosed 8/15, ER+/PR+/HER2- on right.  She was treated with right mastectomy, chemo with doxorubicin/cyclophosphamide and paclitaxel. She had radiation.  - Echo (1/18): EF 55-60%.  - Echo (10/22): EF 54-09%, normal diastolic function, normal RV.  5. Chest pain: Coronary CTA in 10/22 showed calcium score 0, no significant CAD.   FH: Mother with TIA, heart murmur.  Grandmother with MI, CVA in her 18s.  Grandfather with MI in his 34s.  Aunt with heart valve replacement.   Social History   Socioeconomic History   Marital status: Married    Spouse name: Not on file   Number of children: 1   Years of education: Not on file   Highest education level: Not on file  Occupational History    Comment: [employer] [place]  Tobacco Use   Smoking status: Never   Smokeless tobacco: Never  Vaping Use   Vaping Use: Never used  Substance and Sexual Activity   Alcohol use: No    Alcohol/week: 0.0 standard drinks   Drug use: No   Sexual activity: Yes  Other Topics Concern   Not on file  Social History Narrative   Not on file   Social Determinants of Health   Financial Resource  Strain: Not on file  Food Insecurity: Not on file  Transportation Needs: Not on file  Physical Activity: Not on file  Stress: Not on file  Social Connections: Not on file  Intimate Partner Violence: Not on file   ROS: All systems reviewed and negative except as per HPI.   Current Outpatient Medications  Medication Sig Dispense Refill   Ergocalciferol (VITAMIN D2 PO) Take 7.5 mLs by mouth daily in the afternoon.     levothyroxine (SYNTHROID) 88 MCG tablet Take 88 mcg by mouth daily before breakfast.     No current facility-administered medications for this encounter.   BP 102/70   Pulse 75   Wt 55.4 kg (122 lb 3.2 oz)   LMP 09/21/2015   SpO2 99%   BMI 23.09 kg/m  General: NAD Neck: No JVD, no thyromegaly or thyroid nodule.  Lungs: Clear to auscultation bilaterally with normal respiratory effort. CV: Nondisplaced PMI.  Heart regular S1/S2, no S3/S4, no murmur.  No peripheral edema.  No carotid bruit.  Normal pedal pulses.  Abdomen: Soft, nontender, no hepatosplenomegaly, no distention.  Skin: Intact without lesions or rashes.  Neurologic: Alert and oriented x 3.  Psych: Normal affect. Extremities: No clubbing or cyanosis.  HEENT: Normal.   Assessment/Plan:  1. Chest heaviness/exertional dyspnea and fatigue: Atypical symptoms for cardiac ischemia.  However, she has a family history of vascular disease and was noted  to have aortic atherosclerosis on a CT. Coronary CTA showed calcium score 0 and not significant CAD.  LDL was 122.   - Given aortic atherosclerosis, would be reasonable to treat with statin.  She does not want to take a statin, and with calcium score 0 and clear coronaries, I do not think that it is an absolute necessity.  2. History of doxorubicin: Echo in 10/22 showed normal LV systolic function.   She can followup prn.   Loralie Champagne 11/07/2021

## 2021-12-22 ENCOUNTER — Ambulatory Visit
Admission: RE | Admit: 2021-12-22 | Discharge: 2021-12-22 | Disposition: A | Discharge status: Home / Self Care | Payer: Medicare HMO | Source: Ambulatory Visit | Attending: Endocrinology | Admitting: Endocrinology

## 2021-12-22 DIAGNOSIS — E041 Nontoxic single thyroid nodule: Secondary | ICD-10-CM

## 2022-01-19 ENCOUNTER — Other Ambulatory Visit: Payer: Self-pay | Admitting: Hematology and Oncology

## 2022-01-19 ENCOUNTER — Other Ambulatory Visit: Payer: Self-pay

## 2022-01-19 DIAGNOSIS — Z9011 Acquired absence of right breast and nipple: Secondary | ICD-10-CM

## 2022-01-19 DIAGNOSIS — Z1231 Encounter for screening mammogram for malignant neoplasm of breast: Secondary | ICD-10-CM

## 2022-01-19 DIAGNOSIS — C50511 Malignant neoplasm of lower-outer quadrant of right female breast: Secondary | ICD-10-CM

## 2022-01-19 NOTE — Progress Notes (Signed)
Pt called to request routine bone density. Orders placed per MD. Pt aware and verbalized thanks.

## 2022-02-15 ENCOUNTER — Other Ambulatory Visit: Payer: Self-pay

## 2022-02-15 ENCOUNTER — Ambulatory Visit
Admission: RE | Admit: 2022-02-15 | Discharge: 2022-02-15 | Disposition: A | Discharge status: Home / Self Care | Payer: Medicare HMO | Source: Ambulatory Visit | Attending: Hematology and Oncology | Admitting: Hematology and Oncology

## 2022-02-15 DIAGNOSIS — C50511 Malignant neoplasm of lower-outer quadrant of right female breast: Secondary | ICD-10-CM

## 2022-02-19 ENCOUNTER — Ambulatory Visit
Admission: RE | Admit: 2022-02-19 | Discharge: 2022-02-19 | Disposition: A | Discharge status: Home / Self Care | Payer: Medicare HMO | Source: Ambulatory Visit | Attending: Hematology and Oncology | Admitting: Hematology and Oncology

## 2022-02-19 DIAGNOSIS — Z1231 Encounter for screening mammogram for malignant neoplasm of breast: Secondary | ICD-10-CM

## 2022-02-19 DIAGNOSIS — Z9011 Acquired absence of right breast and nipple: Secondary | ICD-10-CM

## 2022-06-14 ENCOUNTER — Ambulatory Visit (HOSPITAL_COMMUNITY)
Admission: RE | Admit: 2022-06-14 | Discharge: 2022-06-14 | Disposition: A | Discharge status: Home / Self Care | Payer: Medicare HMO | Source: Ambulatory Visit | Attending: Oncology | Admitting: Oncology

## 2022-06-14 DIAGNOSIS — C50511 Malignant neoplasm of lower-outer quadrant of right female breast: Secondary | ICD-10-CM | POA: Diagnosis present

## 2022-06-14 MED ORDER — IOHEXOL 300 MG/ML  SOLN
75.0000 mL | Freq: Once | INTRAMUSCULAR | Status: AC | PRN
  Administered 2022-06-14: 75 mL via INTRAVENOUS

## 2022-06-14 MED ORDER — SODIUM CHLORIDE (PF) 0.9 % IJ SOLN
INTRAMUSCULAR | Status: AC
  Filled 2022-06-14: qty 50

## 2022-06-18 ENCOUNTER — Telehealth: Payer: Self-pay | Admitting: *Deleted

## 2022-06-18 NOTE — Telephone Encounter (Addendum)
-----   Message from Tonny Bollman, RN sent at 06/15/2022 11:05 AM EDT ----- Regarding: Results-CT Chest w/contrast CT ordered by Dr. Darnelle Catalan last year. Patient has My Chart appt with Dr. Al Pimple on 08/19/22  ----- Message ----- From: Interface, Rad Results In Sent: 06/15/2022  10:57 AM EDT To: Onc Triage Nurse [facility]  This RN spoke with pt per results of CT - note per discussion pt states she wants to be seen in person for visit in August and asked about getting CT repeated next year.  Larine states she understands nodules in chest are stable "but then when I had my first mammogram there were calcifications " that they said they would monitor but then I got cancer "  She states she continues to not feel good " and  I don't have cancer anymore ".  Per discussion she will keep appt as scheduled in August and discuss concerns with MD at that time.

## 2022-07-06 ENCOUNTER — Other Ambulatory Visit: Payer: Medicare HMO

## 2022-07-06 ENCOUNTER — Ambulatory Visit: Payer: Medicare HMO | Admitting: Hematology and Oncology

## 2022-07-13 ENCOUNTER — Ambulatory Visit: Payer: Medicare HMO | Admitting: Hematology and Oncology

## 2022-07-13 ENCOUNTER — Other Ambulatory Visit: Payer: Medicare HMO

## 2022-07-18 ENCOUNTER — Ambulatory Visit: Payer: Medicare HMO | Admitting: Dermatology

## 2022-07-23 ENCOUNTER — Ambulatory Visit: Payer: Medicare HMO | Admitting: Dermatology

## 2022-07-31 ENCOUNTER — Ambulatory Visit: Payer: Medicare HMO | Admitting: Dermatology

## 2022-08-09 ENCOUNTER — Inpatient Hospital Stay: Payer: Medicare HMO | Attending: Hematology and Oncology | Admitting: Hematology and Oncology

## 2022-08-09 ENCOUNTER — Ambulatory Visit (HOSPITAL_COMMUNITY)
Admission: RE | Admit: 2022-08-09 | Discharge: 2022-08-09 | Disposition: A | Discharge status: Home / Self Care | Payer: Medicare HMO | Source: Ambulatory Visit | Attending: Hematology and Oncology | Admitting: Hematology and Oncology

## 2022-08-09 ENCOUNTER — Encounter: Payer: Self-pay | Admitting: Hematology and Oncology

## 2022-08-09 ENCOUNTER — Ambulatory Visit: Payer: No Typology Code available for payment source

## 2022-08-09 ENCOUNTER — Other Ambulatory Visit: Payer: Self-pay | Admitting: Hematology and Oncology

## 2022-08-09 ENCOUNTER — Other Ambulatory Visit: Payer: Self-pay

## 2022-08-09 VITALS — BP 121/74 | HR 91 | Temp 98.1°F | Resp 16 | Ht 61.0 in | Wt 118.8 lb

## 2022-08-09 DIAGNOSIS — M545 Low back pain, unspecified: Secondary | ICD-10-CM

## 2022-08-09 DIAGNOSIS — K900 Celiac disease: Secondary | ICD-10-CM | POA: Insufficient documentation

## 2022-08-09 DIAGNOSIS — M810 Age-related osteoporosis without current pathological fracture: Secondary | ICD-10-CM | POA: Insufficient documentation

## 2022-08-09 DIAGNOSIS — G629 Polyneuropathy, unspecified: Secondary | ICD-10-CM | POA: Insufficient documentation

## 2022-08-09 DIAGNOSIS — E042 Nontoxic multinodular goiter: Secondary | ICD-10-CM

## 2022-08-09 DIAGNOSIS — Z90710 Acquired absence of both cervix and uterus: Secondary | ICD-10-CM | POA: Diagnosis not present

## 2022-08-09 DIAGNOSIS — F419 Anxiety disorder, unspecified: Secondary | ICD-10-CM | POA: Insufficient documentation

## 2022-08-09 DIAGNOSIS — Z923 Personal history of irradiation: Secondary | ICD-10-CM | POA: Insufficient documentation

## 2022-08-09 DIAGNOSIS — R918 Other nonspecific abnormal finding of lung field: Secondary | ICD-10-CM | POA: Diagnosis present

## 2022-08-09 DIAGNOSIS — Z803 Family history of malignant neoplasm of breast: Secondary | ICD-10-CM | POA: Insufficient documentation

## 2022-08-09 DIAGNOSIS — Z853 Personal history of malignant neoplasm of breast: Secondary | ICD-10-CM | POA: Diagnosis not present

## 2022-08-09 DIAGNOSIS — C50511 Malignant neoplasm of lower-outer quadrant of right female breast: Secondary | ICD-10-CM

## 2022-08-09 NOTE — Progress Notes (Signed)
[place]  Telephone:([phone] Fax:(336) [phone]     ID: Misty [name] DOB: 14-Mar-1976  MR#: [id number]  HQI#:[id number]  Patient Care Team: Glenda Chroman, MD as PCP - General (Internal Medicine) Jamey Ripa, PhD (Inactive) as Consulting Physician (Psychology) Juanita Craver, MD as Consulting Physician (Gastroenterology) Jacelyn Pi, MD as Referring Physician (Endocrinology) Lavonna Monarch, MD as Consulting Physician (Dermatology) OTHER MD: Freda Munro MD (GYN) Theodoro Kos M.D. 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 She is now under observation. Misty [name] has now been off tamoxifen since her last visit on 03/14/2021.    She also had a CT chest with contrast ordered by Dr. Jana Hakim which showed tiny scattered bilateral pulmonary nodules measuring up to 3 mm and are considered stable dating back to November 2021 consistent with a benign finding.  No new suspicious pulmonary nodules or masses. Last ultrasound of the thyroid meets criteria for 1 year ultrasound surveillance.  She continues to report feeling unwell. She reports ongoing neuropathy, doesn't sleep well, has memory issues, dysequilibrium and bruises easy. These are old complaints She otherwise reports some shooting constant back pains as well as shooting low back pains described as tooth ache. It may have started about 2 months ago, rates that low back pain 8/10, takes tylenol. She also reports breast pain in the left, last mammogram Feb 2023. Rest of the pertinent 10 point ROS reviewed and negative.   COVID 19 VACCINATION STATUS: Refuses vaccination as her brother developed Guillain-Barr after a vaccine   BREAST CANCER HISTORY: From the original intake note:  Misty [name] was closely followed for some right  breast changes between 20 11/12/2012. Everything seemed stable at that time. In 2014 she was promoted in her job, was working very hard, and actually missed having a mammogram. Late in January 2015 she noted some pain in her right breast and by February when she lifted her arms she noted that the nipple was tilting sideways and seemed a little bit of gathered. She didn't think much of this however. It was not until July that she noted a palpable mass in the lateral right breast. She brought this to her gynecologist's attention and she was set up for diagnostic mammography at [facility] 08/04/2014.   There was an area of distortion in the lateral aspect of the right breast noted on mammography,  corresponding to a palpable firm mass at the 8:00 position of the right breast. Ultrasound confirmed a 2.4 cm irregular hypoechoic spiculated mass in the right breast at the 8:00 position and in addition at the 5:00 position 1 cm from the nipple there was a taller than wide irregular hypoechoic mass measuring 0.8 cm. There was no right axillary adenopathy. In the left breast upper-outer quadrant there was a minimally complicated cyst measuring 0.7 cm.  On 08/11/2014 the patient underwent biopsy of the 2 right breast masses noted on ultrasound. 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 The proliferation fraction by MIB-1 was 17% ([id number]; this report was reviewed by our pathologist, who concurred, [name] [phone]).  On 08/25/2014 the patient underwent bilateral breast MRI at [facility]. This showed, in the  right breast, an irregular enhancing mass at the 8:00 position measuring 2.7 cm. Also in the right breast at the 6:00 position there was another enhancing mass measuring 0.6 cm, located 2  cm away from the larger mass. There were also masses in the retroareolar position measuring 0.7 cm, in the posterior central right breast measuring 0.5 cm, and another one just inferior to the midline measuring 5.5 cm. There were no masses or findings of concern in the left breast and no abnormal appearing lymph nodes.  The patient's subsequent history is as detailed below.   PAST MEDICAL HISTORY: Past Medical History:  Diagnosis Date   Anxiety    situational   Cancer ([place])    breast cancer   Celiac disease    Headache(784.0)    Hypothyroidism    PONV (postoperative nausea and vomiting)    Radiation 04/18/15-05/26/15   right breast   Thyroid disease     PAST SURGICAL HISTORY: Past Surgical History:  Procedure Laterality Date   CESAREAN SECTION     CHOLECYSTECTOMY     GALLBLADDER SURGERY     LAPAROSCOPIC ASSISTED VAGINAL HYSTERECTOMY N/A 09/27/2015   Procedure: LAPAROSCOPIC ASSISTED VAGINAL HYSTERECTOMY;  Surgeon: Olga Millers, MD;  Location: [facility];  Service: Gynecology;  Laterality: N/A;   LAPAROSCOPIC BILATERAL SALPINGO OOPHERECTOMY Bilateral 09/27/2015   Procedure: LAPAROSCOPIC BILATERAL SALPINGO OOPHORECTOMY;  Surgeon: Olga Millers, MD;  Location: [facility];  Service: Gynecology;  Laterality: Bilateral;   MASTECTOMY Right    malignant   PORT-A-CATH REMOVAL N/A 09/27/2015   Procedure: REMOVAL PORT-A-CATH;  Surgeon: Fanny Skates, MD;  Location: [facility];  Service: General;  Laterality: N/A;  Porta Cath removed intact without defects   PORTACATH PLACEMENT Right 10/22/2014   Procedure: INSERTION PORT-A-CATH;  Surgeon: Fanny Skates, MD;  Location: [facility];  Service: General;  Laterality: Right;   SIMPLE MASTECTOMY WITH AXILLARY SENTINEL NODE BIOPSY Right 09/28/2014   Procedure: RIGHT TOTAL MASTECTOMY, RIGHT  AXILLARY SENTINEL NODE BIOPSY;  Surgeon: Fanny Skates, MD;  Location: [facility];  Service: General;  Laterality: Right;    FAMILY  HISTORY Family History  Problem Relation Age of Onset   Skin cancer Father    Breast cancer Maternal Grandmother        dx unknown age   Breast cancer Other 61       mat great aunt through [facility] with breast cancer   the patient's parents are both living, in their late 35s. The patient had one brother, no sisters. The patient's maternal grandmother was diagnosed with breast cancer in her late 24s. One of that grandmother's sisters was also diagnosed with breast cancer, in her 79s. There is no other history of breast or ovarian cancer in the family   GYNECOLOGIC HISTORY:  Patient's last menstrual period was 09/21/2015. Menarche age 89, first live birth age 82, the patient is GX P1. She was still having regular periods at the start of chemotherapy, but this stopped early December 2015. She took oral contraceptives for approximately 9 years with no complications   SOCIAL HISTORY: (updated July 2022) Misty [name] worked as a Librarian, academic in the collections section of the [employer] but she is on disability with the state. Her husband, Misty [name], worked as a Engineer, structural in [place] but he retired in 2016 and now runs a Building services engineer. Their son Misty [name] is 32 and is planning to study criminology and become a [place] Marshall.  The patient attends a  local [place] DIRECTIVES: In the absence of any documents to the contrary the patient's husband is her healthcare power of attorney   HEALTH MAINTENANCE: Social History   Tobacco Use   Smoking status: Never   Smokeless tobacco: Never  Vaping Use   Vaping Use: Never used  Substance Use Topics   Alcohol use: No    Alcohol/week: 0.0 standard drinks of alcohol   Drug use: No     Colonoscopy:  PAP: 07/29/2014  Bone density:  Lipid panel:  Allergies  Allergen Reactions   Gluten Meal Other (See Comments)    Has celiac's disease   Penicillins Nausea And Vomiting    Has patient had a PCN reaction causing  immediate rash, facial/tongue/throat swelling, SOB or lightheadedness with hypotension: No Has patient had a PCN reaction causing severe rash involving mucus membranes or skin necrosis: No Has patient had a PCN reaction that required hospitalization No Has patient had a PCN reaction occurring within the last 10 years: No If all of the above answers are "NO", then may proceed with Cephalosporin use. Son has had a severe reaction to it   Vitamin D Other (See Comments)    She is able to tolerate the liquid form -as long as it does not have Gluten in it.     Current Outpatient Medications  Medication Sig Dispense Refill   Ergocalciferol (VITAMIN D2 PO) Take 7.5 mLs by mouth daily in the afternoon.     levothyroxine (SYNTHROID) 88 MCG tablet Take 88 mcg by mouth daily before breakfast.     No current facility-administered medications for this visit.    OBJECTIVE: white woman who appears stated age  23:   08/09/22 0921  BP: 121/74  Pulse: 91  Resp: 16  Temp: 98.1 F (36.7 C)  SpO2: 100%     Body mass index is 22.45 kg/m.    ECOG FS:1 - Symptomatic but completely ambulatory  Physical Exam Constitutional:      Appearance: Normal appearance.  Pulmonary:     Breath sounds: Normal breath sounds.  Chest:     Comments: She is status post right mastectomy.  Left breast normal to inspection and palpation.  No regional adenopathy Musculoskeletal:        General: Tenderness (Bilateral hips) present. No swelling.     Cervical back: Normal range of motion and neck supple. No rigidity.  Lymphadenopathy:     Cervical: No cervical adenopathy.  Neurological:     Mental Status: She is alert.      LAB RESULTS:  CMP     Component Value Date/Time   NA 140 10/02/2021 1257   NA 143 10/17/2016 0850   K 4.3 10/02/2021 1257   K 4.9 10/17/2016 0850   CL 106 10/02/2021 1257   CO2 25 10/02/2021 1257   CO2 28 10/17/2016 0850   GLUCOSE 87 10/02/2021 1257   GLUCOSE 98 10/17/2016 0850    BUN 13 10/02/2021 1257   BUN 17.0 10/17/2016 0850   CREATININE 0.63 10/02/2021 1257   CREATININE 0.72 07/06/2021 0849   CREATININE 0.7 10/17/2016 0850   CALCIUM 9.6 10/02/2021 1257   CALCIUM 10.2 10/17/2016 0850   PROT 7.2 07/06/2021 0849   PROT 7.5 10/17/2016 0850   ALBUMIN 3.9 07/06/2021 0849   ALBUMIN 4.0 10/17/2016 0850   AST 19 07/06/2021 0849   AST 21 10/17/2016 0850   ALT 15 07/06/2021 0849   ALT 16 10/17/2016 0850   ALKPHOS 67 07/06/2021  0849   ALKPHOS 107 10/17/2016 0850   BILITOT 0.4 07/06/2021 0849   BILITOT 0.44 10/17/2016 0850   GFRNONAA >60 10/02/2021 1257   GFRNONAA >60 07/06/2021 0849   GFRAA >60 09/16/2020 1144     No results found for: "SPEP", "UPEP"  Lab Results  Component Value Date   WBC 4.9 07/06/2021   NEUTROABS 3.1 07/06/2021   HGB 13.0 07/06/2021   HCT 39.2 07/06/2021   MCV 93.6 07/06/2021   PLT 172 07/06/2021      Chemistry      Component Value Date/Time   NA 140 10/02/2021 1257   NA 143 10/17/2016 0850   K 4.3 10/02/2021 1257   K 4.9 10/17/2016 0850   CL 106 10/02/2021 1257   CO2 25 10/02/2021 1257   CO2 28 10/17/2016 0850   BUN 13 10/02/2021 1257   BUN 17.0 10/17/2016 0850   CREATININE 0.63 10/02/2021 1257   CREATININE 0.72 07/06/2021 0849   CREATININE 0.7 10/17/2016 0850      Component Value Date/Time   CALCIUM 9.6 10/02/2021 1257   CALCIUM 10.2 10/17/2016 0850   ALKPHOS 67 07/06/2021 0849   ALKPHOS 107 10/17/2016 0850   AST 19 07/06/2021 0849   AST 21 10/17/2016 0850   ALT 15 07/06/2021 0849   ALT 16 10/17/2016 0850   BILITOT 0.4 07/06/2021 0849   BILITOT 0.44 10/17/2016 0850      No results found for: "LABCA2"  No components found for: "LABCA125"  No results for input(s): "INR" in the last 168 hours.  Urinalysis    Component Value Date/Time   LABSPEC 1.015 12/03/2014 1007   PHURINE 7.5 12/03/2014 1007   GLUCOSEU Negative 12/03/2014 1007   HGBUR Negative 12/03/2014 1007   BILIRUBINUR Negative 12/03/2014 1007    KETONESUR Negative 12/03/2014 1007   PROTEINUR Negative 12/03/2014 1007   UROBILINOGEN 0.2 12/03/2014 1007   NITRITE Negative 12/03/2014 1007   LEUKOCYTESUR Negative 12/03/2014 1007     STUDIES: No results found.    ASSESSMENT: 46 y.o. BRCA negative [place], [place] woman status post biopsy of 2 separate right breast lower outer quadrant masses 08/11/2014, both positive for an invasive ductal carcinoma, grade 1, estrogen and progesterone receptor strongly positive, with an MIB-1 of 19%, and HER-2 nonamplified  (1) genetics testing sent 08/26/2014, showing no deleterious mutations in the BRCA genes; there were also no mutations in ATM, BARD 1, BRIP1, [place] 1, CHE K2, MR E11A, M UT YH, N BN, NF1, PA L B2, PTE N, RAD 50, RAD 50 1C, RAD 50 1D, and T p53.  (2) status post right mastectomy with sentinel lymph node sampling 09/28/2014 for an mpT2 pN1, stage IIB invasive breast cancer (with both ductal and lobular features), grade 2, with negative margins  (3) adjuvant chemotherapy consisted of doxorubicin and cyclophosphamide in dose dense fashion 4, completed 12/30/2014, followed by paclitaxel weekly x 12 started 01/21/2015.  (a) paclitaxel stopped after 5 treatments due to persistent neuropathy, last dose 02/18/2015  (4) adjuvant radiation given 04/18/2015-05/26/2015: Right chest wall/50.4 Pearline Cables @ 1.8 Pearline Cables per fraction x 28 fractions Right supraclavicular fossa/PAB 45 Gy @1 .8 Gy per fraction x 25 fractions Right scar boost / 10 [employer] per fraction x 5 fractions  (5) started tamoxifen 07/07/2015--taken very irregularly, stopped 02/2021  (a) s/p TAH-BSO 09/27/2015 with benign pathology  (b) bone density 01/27/2020 showed a T score of -2.3  (6) history of celiac disease  (7) neuro psychologic testing 08/29/2016 identified deficiencies on immediate auditory  verbal memory span and verbal learning capacity, within the borderline impaired and low average ranges. 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 Anxiety and stress are seen as contributing to a worsening of her cognitive functioning but not as causing her cognitive difficulties. It is not known how long her cognitive difficulties might persist."  (8) empty sella by brain MRI 10/24/2016  (A) repeat brain MRI 06/05/2021 shows stable to improved disease   PLAN: Misty [name] is now just about 8 years out from definitive surgery for her breast cancer with no evidence of disease recurrence.  This is very favorable.  I have once again discussed about graduation from breast cancer center since her lung nodules have demonstrated stability over the past 2 years.  She is however very anxious about the possibility of these growing and turning into cancer.  She was hoping that we can continue surveillance.  I have discussed with her that there is radiation related risks with imaging that is not entirely indicated.  She was hoping that we can at least follow-up with [place] every 2 years.  At this point she will continue follow-up in the survivorship clinic.  I will ask our nurse practitioner Mendel Ryder if she can repeat CT chest without contrast in 2025 for monitoring these lung nodules as the patient requests.  She also had ultrasound thyroid in December 2022 which showed multiple thyroid nodules, needs another ultrasound in December 2023.  This is followed by her endocrinologist.  Bone density shows osteoporosis, recommend ca/vit D and weight bearing exercises. Recommended endocrinology follow up for management of osteoporosis.  She expressed understanding  Low back pain, likely musculoskeletal, will order some x-rays.  She understands that x-rays may not show all pathology and she will have to keep [place] informed if this pain continues to worsen despite normal x-ray findings.  She expressed understanding.  Mammogram last mammogram of the left breast in February with no findings.  Physical  examination today also unremarkable.  She will continue annual mammograms in the survivorship clinic  She has other longstanding disequilibrium issues, peripheral neuropathy, memory lapses.  Total encounter time 40 minutes.*  *Total Encounter Time as defined by the Centers for Medicare and Medicaid Services includes, in addition to the face-to-face time of a patient visit (documented in the note above) non-face-to-face time: obtaining and reviewing outside history, ordering and reviewing medications, tests or procedures, care coordination (communications with other health care professionals or caregivers) and documentation in the medical record.

## 2022-08-14 ENCOUNTER — Telehealth: Payer: Self-pay | Admitting: *Deleted

## 2022-08-14 NOTE — Telephone Encounter (Signed)
Called pt per MD request and gave results of recent xray.

## 2022-08-14 NOTE — Telephone Encounter (Signed)
-----   Message from Benay Pike, MD sent at 08/13/2022  2:33 PM EDT ----- Val,  This patient was reporting some hip pain, and she was very worried about this. X rays are negative. I did tell her that she should let us know if the pain continues. Can you please talk to her and see how she is doing and if we need to do any additional investigation  Thanks,

## 2022-10-25 ENCOUNTER — Other Ambulatory Visit: Payer: Self-pay | Admitting: Endocrinology

## 2022-10-25 DIAGNOSIS — E041 Nontoxic single thyroid nodule: Secondary | ICD-10-CM

## 2022-10-26 ENCOUNTER — Other Ambulatory Visit (HOSPITAL_COMMUNITY): Payer: Self-pay | Admitting: Cardiology

## 2022-10-26 ENCOUNTER — Ambulatory Visit (HOSPITAL_COMMUNITY)
Admission: RE | Admit: 2022-10-26 | Discharge: 2022-10-26 | Disposition: A | Discharge status: Home / Self Care | Payer: Medicare HMO | Source: Ambulatory Visit | Attending: Cardiology | Admitting: Cardiology

## 2022-10-26 ENCOUNTER — Encounter (HOSPITAL_COMMUNITY): Payer: Self-pay | Admitting: Cardiology

## 2022-10-26 ENCOUNTER — Inpatient Hospital Stay (HOSPITAL_COMMUNITY)
Admission: RE | Admit: 2022-10-26 | Discharge: 2022-10-26 | Disposition: A | Discharge status: Home / Self Care | Payer: Medicare HMO | Source: Ambulatory Visit | Attending: Cardiology | Admitting: Cardiology

## 2022-10-26 VITALS — BP 104/64 | HR 91 | Wt 122.2 lb

## 2022-10-26 DIAGNOSIS — I5022 Chronic systolic (congestive) heart failure: Secondary | ICD-10-CM | POA: Diagnosis not present

## 2022-10-26 DIAGNOSIS — I2510 Atherosclerotic heart disease of native coronary artery without angina pectoris: Secondary | ICD-10-CM | POA: Insufficient documentation

## 2022-10-26 DIAGNOSIS — R0789 Other chest pain: Secondary | ICD-10-CM | POA: Diagnosis present

## 2022-10-26 DIAGNOSIS — Z923 Personal history of irradiation: Secondary | ICD-10-CM | POA: Diagnosis not present

## 2022-10-26 DIAGNOSIS — R002 Palpitations: Secondary | ICD-10-CM

## 2022-10-26 DIAGNOSIS — E782 Mixed hyperlipidemia: Secondary | ICD-10-CM | POA: Diagnosis not present

## 2022-10-26 DIAGNOSIS — I700 Atherosclerosis of aorta: Secondary | ICD-10-CM | POA: Insufficient documentation

## 2022-10-26 DIAGNOSIS — E785 Hyperlipidemia, unspecified: Secondary | ICD-10-CM | POA: Insufficient documentation

## 2022-10-26 LAB — BASIC METABOLIC PANEL
Anion gap: 6 (ref 5–15)
BUN: 16 mg/dL (ref 6–20)
CO2: 29 mmol/L (ref 22–32)
Calcium: 9.6 mg/dL (ref 8.9–10.3)
Chloride: 105 mmol/L (ref 98–111)
Creatinine, Ser: 0.63 mg/dL (ref 0.44–1.00)
GFR, Estimated: >60 mL/min (ref >60)
Glucose, Bld: 89 mg/dL (ref 70–99)
Potassium: 4.6 mmol/L (ref 3.5–5.1)
Sodium: 140 mmol/L (ref 135–145)

## 2022-10-26 LAB — LIPID PANEL
Cholesterol: 212 mg/dL — ABNORMAL HIGH (ref 0–200)
HDL: 66 mg/dL (ref >40)
LDL Cholesterol: 138 mg/dL — ABNORMAL HIGH (ref 0–99)
Total CHOL/HDL Ratio: 3.2 RATIO
Triglycerides: 40 mg/dL (ref <150)
VLDL: 8 mg/dL (ref 0–40)

## 2022-10-26 LAB — TSH: TSH: 1.737 u[IU]/mL (ref 0.350–4.500)

## 2022-10-26 LAB — MAGNESIUM: Magnesium: 2 mg/dL (ref 1.7–2.4)

## 2022-10-26 NOTE — Patient Instructions (Signed)
There has been no changes to your medications.  Labs done today, your results will be available in MyChart, we will contact you for abnormal readings.  Your provider has recommended that  you wear a Zio Patch for 14 days.  This monitor will record your heart rhythm for our review.  IF you have any symptoms while wearing the monitor please press the button.  If you have any issues with the patch or you notice a red or orange light on it please call the company at [phone].  Once you remove the patch please mail it back to the company as soon as possible so we can get the results.   Once we get the Zio report we will call you to arrange your follow up appointment.  If you have any questions or concerns before your next appointment please send [place] a message through [place] or call our office at [phone].    TO LEAVE A MESSAGE FOR THE NURSE SELECT OPTION 2, PLEASE LEAVE A MESSAGE INCLUDING: YOUR NAME DATE OF BIRTH CALL BACK NUMBER REASON FOR CALL**this is important as we prioritize the call backs  YOU WILL RECEIVE A CALL BACK THE SAME DAY AS LONG AS YOU CALL BEFORE 4:00 PM  At the [facility], you and your health needs are our priority. As part of our continuing mission to provide you with exceptional heart care, we have created designated Provider Care Teams. These Care Teams include your primary Cardiologist (physician) and Advanced Practice Providers (APPs- Physician Assistants and Nurse Practitioners) who all work together to provide you with the care you need, when you need it.   You may see any of the following providers on your designated Care Team at your next follow up: Dr Glori Bickers Dr Loralie Champagne Dr. Roxana Hires, NP Lyda Jester, [place] [facility] [place], [place] Forestine Na, NP [name] Riles, PharmD   Please be sure to bring in all your medications bottles to every appointment.

## 2022-10-26 NOTE — Progress Notes (Signed)
Zio patch placed onto patient.  All instructions and information reviewed with patient, they verbalize understanding with no questions. 

## 2022-10-28 NOTE — Progress Notes (Signed)
PCP: Glenda Chroman, MD Oncology: Dr. Jana Hakim Cardiology: Dr. Aundra Dubin  46 y.o. with history of hypothyroidism and celiac disease was referred by Dr. Jana Hakim for evaluation of cardiac risk.  She has been concerned because she has a family history of cardiac disease, and CT chest showed aortic atherosclerosis.  She gets easily fatigued.  She gets occasional heaviness in her chest.  There is no trigger (not exertional), and it can last for a few minutes at a time. She has occasional palpitations.  She gets short of breath and tired if she walks fast. She is a nonsmoker.   Coronary CTA was done in 10/22, showing no significant coronary disease and CAC 0 Agatston units.  Echo in 10/22 was also normal.   Patient returns to cardiology clinic for evaluation of palpitations.  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  This will not last long.  She will note palpitations and will feel lightheaded but she does not pass out.  HR also increases significantly with any activity.  She continues to have episodes of atypical chest pain.  She has been generally fatigued since her breast cancer treatment.     Labs (10/22): LDL 122, K 4.3, creatinine 0.63  ECG (personally reviewed): NSR, normal.   PMH: 1. Hypothyroidism 2. Celiac disease 3. Empty sella syndrome 4. Breast cancer: Diagnosed 8/15, ER+/PR+/HER2- on right.  She was treated with right mastectomy, chemo with doxorubicin/cyclophosphamide and paclitaxel. She had radiation.  - Echo (1/18): EF 55-60%.  - Echo (10/22): EF 95-63%, normal diastolic function, normal RV.  5. Chest pain: Coronary CTA in 10/22 showed calcium score 0, no significant CAD.   FH: Mother with TIA, heart murmur.  Grandmother with MI, CVA in her 9s.  Grandfather with MI in his 15s.  Aunt with heart valve replacement.   Social History   Socioeconomic History   Marital status: Married    Spouse name: Not on file    Number of children: 1   Years of education: Not on file   Highest education level: Not on file  Occupational History    Comment: [employer]  Tobacco Use   Smoking status: Never   Smokeless tobacco: Never  Vaping Use   Vaping Use: Never used  Substance and Sexual Activity   Alcohol use: No    Alcohol/week: 0.0 standard drinks of alcohol   Drug use: No   Sexual activity: Yes  Other Topics Concern   Not on file  Social History Narrative   Not on file   Social Determinants of Health   Financial Resource Strain: Not on file  Food Insecurity: Not on file  Transportation Needs: Not on file  Physical Activity: Not on file  Stress: Not on file  Social Connections: Not on file  Intimate Partner Violence: Not on file   ROS: All systems reviewed and negative except as per HPI.   Current Outpatient Medications  Medication Sig Dispense Refill   Ergocalciferol (VITAMIN D2 PO) Take 7.5 mLs by mouth daily in the afternoon.     levothyroxine (SYNTHROID) 88 MCG tablet Take 88 mcg by mouth daily before breakfast.     No current facility-administered medications for this encounter.   BP 104/64   Pulse 91   Wt 55.4 kg (122 lb 3.2 oz)   LMP 09/21/2015   SpO2 100%   BMI 23.09 kg/m  General: NAD Neck: No JVD, no thyromegaly  or thyroid nodule.  Lungs: Clear to auscultation bilaterally with normal respiratory effort. CV: Nondisplaced PMI.  Heart regular S1/S2, no S3/S4, no murmur.  No peripheral edema.  No carotid bruit.  Normal pedal pulses.  Abdomen: Soft, nontender, no hepatosplenomegaly, no distention.  Skin: Intact without lesions or rashes.  Neurologic: Alert and oriented x 3.  Psych: Normal affect. Extremities: No clubbing or cyanosis.  HEENT: Normal.   Assessment/Plan:  1. Chest pain: Atypical symptoms for cardiac ischemia.  However, she has a family history of vascular disease and was noted to have aortic atherosclerosis on a CT. Coronary CTA showed calcium  score 0 and not significant CAD.  LDL was 122 in 10/22.    - Given aortic atherosclerosis, would be reasonable to treat with statin.  She does not want to take a statin, and with calcium score 0 and clear coronaries, I do not think that it is an absolute necessity. Will repeat lipids today.  2. History of doxorubicin: Echo in 10/22 showed normal LV systolic function.  3. Palpitations: Patient has palpitations with elevated HR as high as 150s on Apple Watch.  She has lightheadedness but no syncope.  ECG today was normal.  - Place 14 day Zio monitor to assess heart rhythm.  - Check electrolytes, TSH.   - Recommended that she increase regular exercise.   Followup will depend on monitor findings.   Loralie Champagne 10/28/2022   She can followup prn.   Loralie Champagne 10/28/2022

## 2022-11-13 IMAGING — MR MR HEAD WO/W CM
13 series · 48 of 48 positions shown · IV contrast (gadavist)
Comparison: MRI of the brain December 19, 2017.

CLINICAL DATA: Breast cancer, staging. Persistent headaches. Empty
sella history.

EXAM:
MRI HEAD WITHOUT AND WITH CONTRAST
TECHNIQUE: Multiplanar, multiecho pulse sequences of the brain and surrounding
structures were obtained without and with intravenous contrast.
CONTRAST:  5mL GADAVIST GADOBUTROL 1 MMOL/ML IV SOLN

[Series 5: DWI · axial · 3.0mm · 1.36mm/px · z∈[-46,+94]mm · 7 of 96 slices shown (1 of 2)]
[im 1/96]
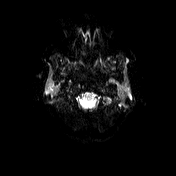
[im 16/96]
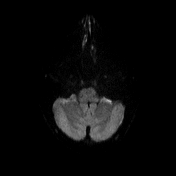
[im 32/96]
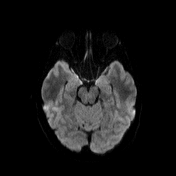
[im 48/96]
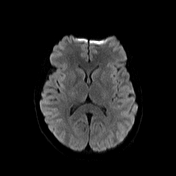
[im 64/96]
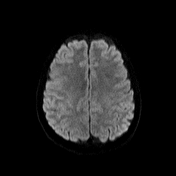
[im 80/96]
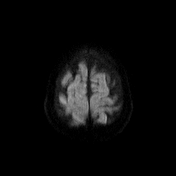
[im 96/96]
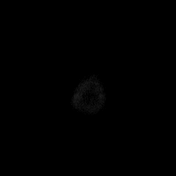

[Series 6: DWI · axial · 3.0mm · 1.36mm/px · z∈[-46,+94]mm · 3 of 48 slices shown (2 of 2)]
[im 1/48]
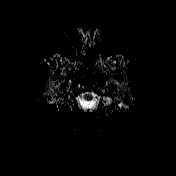
[im 24/48]
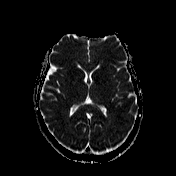
[im 48/48]
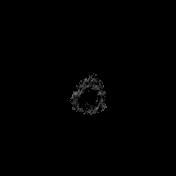

[Series 7: T1 · sagittal · 5.0mm · 0.75mm/px · 1 of 22 slices shown (1 of 2)]
[im 1/22]
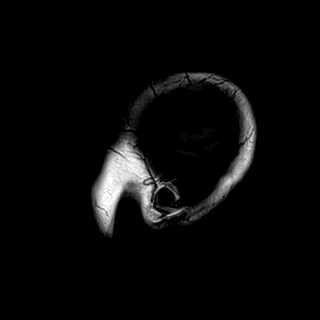

[Series 8: T2 · axial · 5.0mm · 0.62mm/px · 1 of 24 slices shown]
[im 1/24]
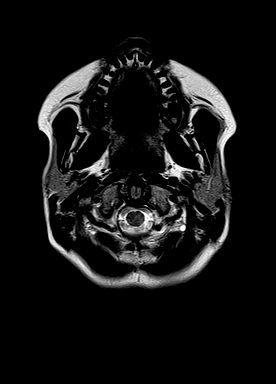

[Series 9: swi_images · axial · 3.0mm · 0.75mm/px · z∈[-56,+96]mm · 3 of 52 slices shown]
[im 1/52]
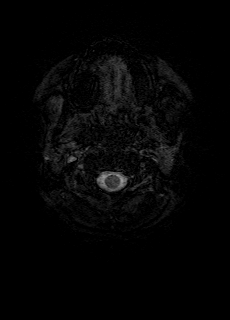
[im 26/52]
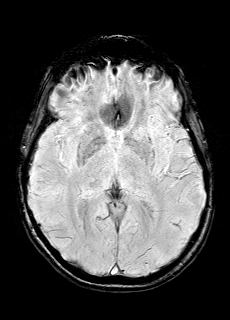
[im 52/52]
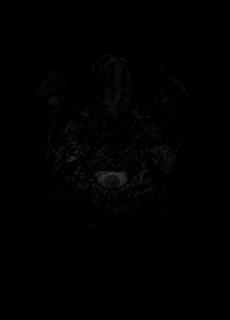

[Series 11: FLAIR · axial · 3.0mm · 0.75mm/px · z∈[-53,+93]mm · 3 of 50 slices shown]
[im 1/50]
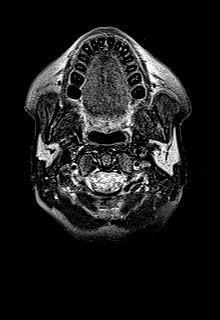
[im 25/50]
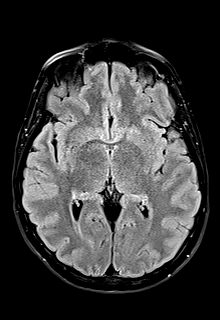
[im 50/50]
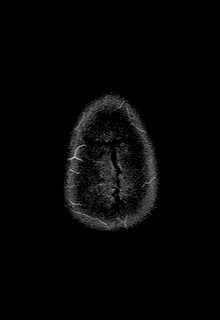

[Series 12: T1 · axial · 1.0mm · 0.94mm/px · z∈[-55,+103]mm · 10 of 160 slices shown (2 of 2)]
[im 1/160]
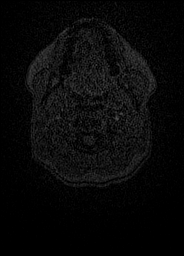
[im 18/160]
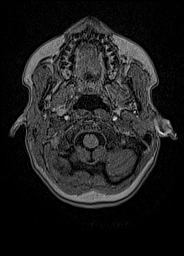
[im 36/160]
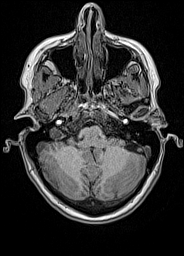
[im 54/160]
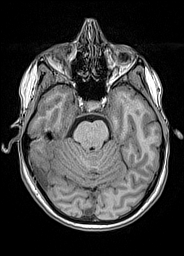
[im 71/160]
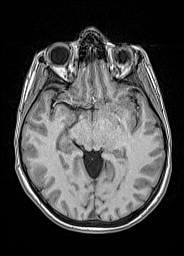
[im 89/160]
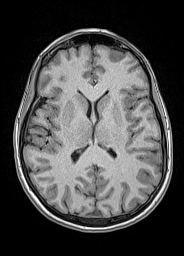
[im 107/160]
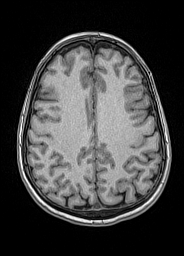
[im 124/160]
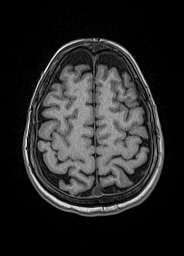
[im 142/160]
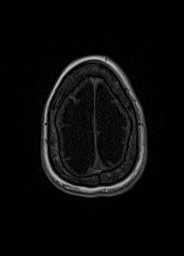
[im 160/160]
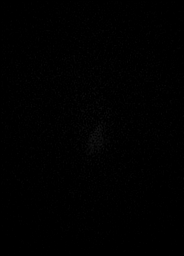

[Series 13: cor dwi_tracew · coronal · 5.0mm · 1.53mm/px · 3 of 54 slices shown]
[im 1/54]
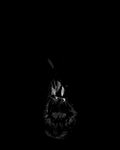
[im 27/54]
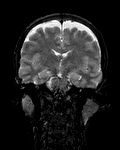
[im 54/54]
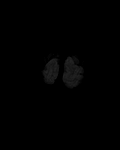

[Series 14: cor dwi_adc · coronal · 5.0mm · 1.53mm/px · 2 of 27 slices shown]
[im 1/27]
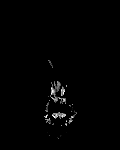
[im 27/27]
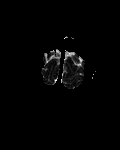

[Series 15: T2 post-contrast · coronal · 5.0mm · 0.57mm/px · 2 of 28 slices shown]
[im 1/28]
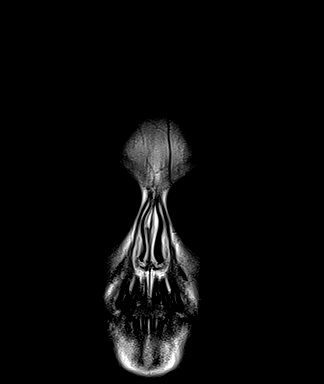
[im 28/28]
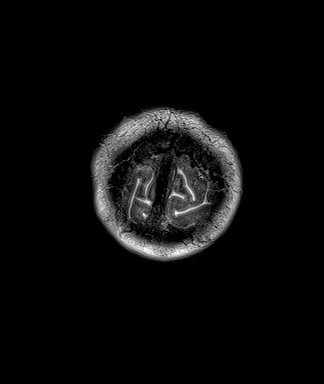

[Series 16: T1 post-contrast · axial · 1.0mm · 0.94mm/px · z∈[-55,+103]mm · 10 of 160 slices shown (1 of 3)]
[im 1/160]
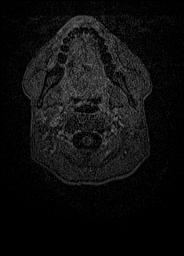
[im 18/160]
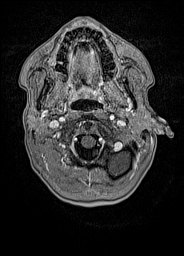
[im 36/160]
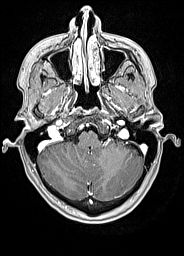
[im 54/160]
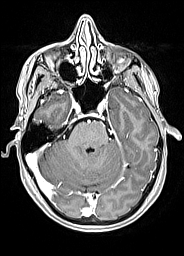
[im 71/160]
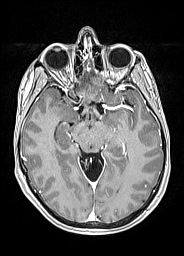
[im 89/160]
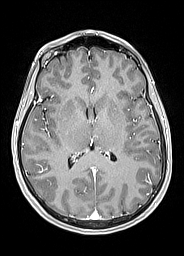
[im 107/160]
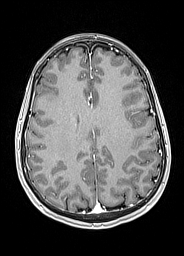
[im 124/160]
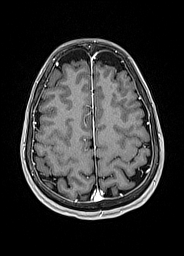
[im 142/160]
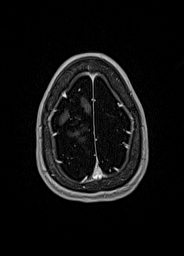
[im 160/160]
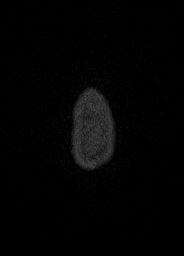

[Series 17: T1 post-contrast · coronal · 5.0mm · 0.43mm/px · 2 of 28 slices shown (2 of 3)]
[im 1/28]
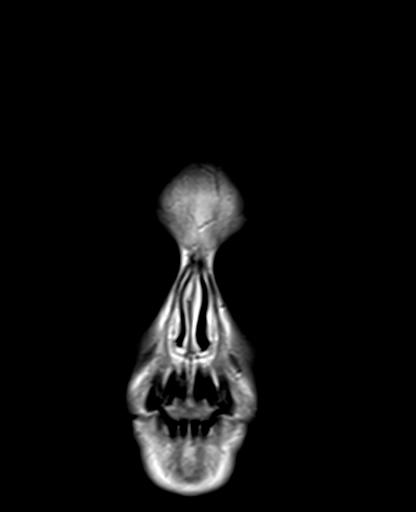
[im 28/28]
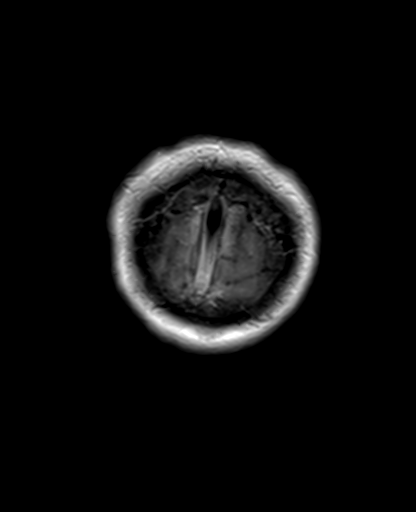

[Series 18: T1 post-contrast · sagittal · 5.0mm · 0.75mm/px · 1 of 22 slices shown (3 of 3)]
[im 1/22]
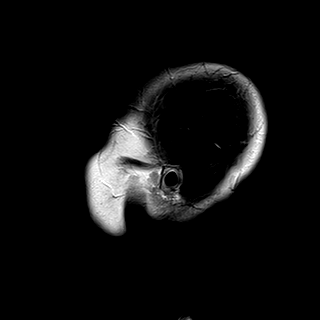

[48 of 48 positions shown; findings below may reference images not displayed]

FINDINGS: Brain: No acute infarction, hemorrhage, hydrocephalus, extra-axial
collection or mass lesion. No focus of abnormal contrast
enhancement.

Previously described partial empty sella appear improved.

Vascular: Normal flow voids.

Skull and upper cervical spine: Normal marrow signal.

Sinuses/Orbits: Mild mucosal thickening of the ethmoid cells. The
orbits are maintained.
IMPRESSION: 1. No enhancing lesion to suggest metastatic disease to the brain.
2. Partial empty sella, improved from prior MRI. Otherwise,
unremarkable MRI of the brain.

## 2022-11-15 IMAGING — CT CT CHEST W/ CM
2 of 4 series · 15 of 36 positions shown, 18 images · IV contrast (omnipaque)
Comparison: PET-CT 10/25/2020

CLINICAL DATA: History of breast cancer. Follow-up pulmonary
nodules seen on prior PET-CT.

EXAM:
CT CHEST WITH CONTRAST
TECHNIQUE: Multidetector CT imaging of the chest was performed during
intravenous contrast administration.
CONTRAST:  75mL OMNIPAQUE IOHEXOL 300 MG/ML  SOLN

[Series 2: axial st · axial · 0.58mm/px · z∈[-316,-38]mm · 12 of 163 slices shown, 15 images]
[im 12/163  mediastinal]
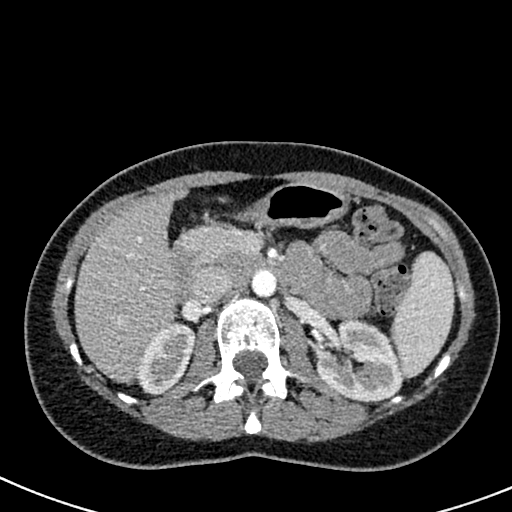
[im 12/163  lung]
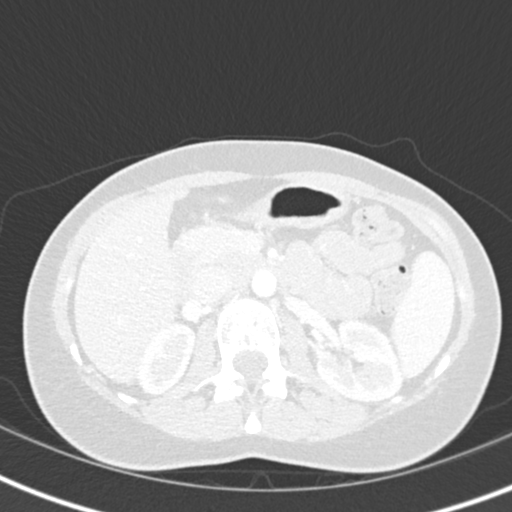
[im 24/163  lung]
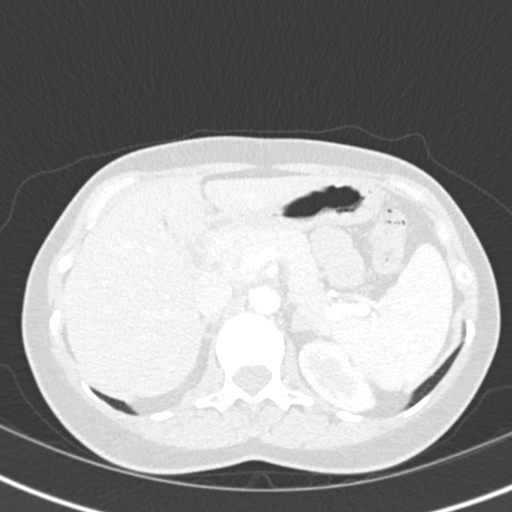
[im 35/163  lung]
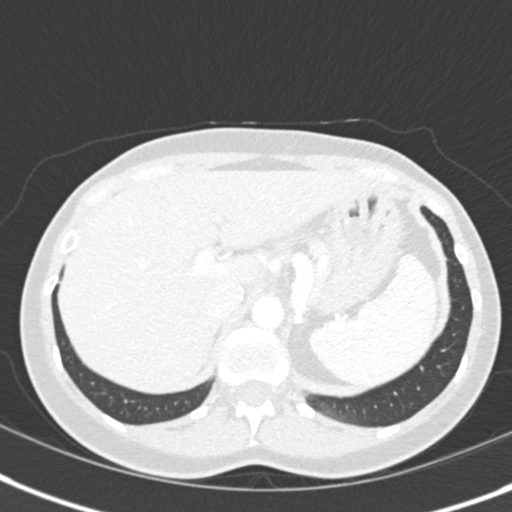
[im 47/163  lung]
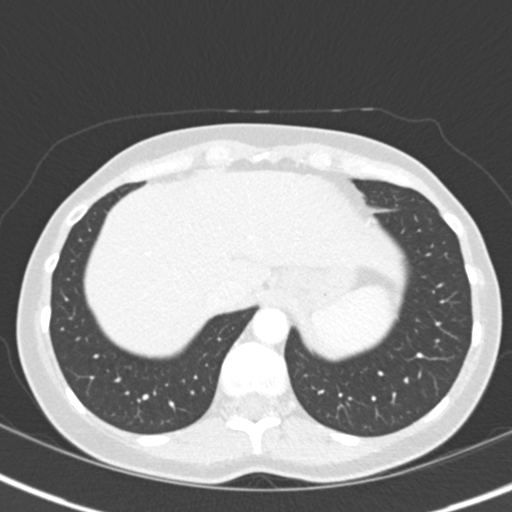
[im 58/163  mediastinal]
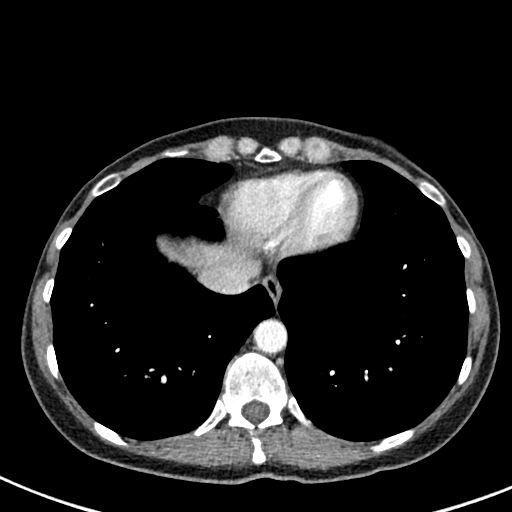
[im 58/163  lung]
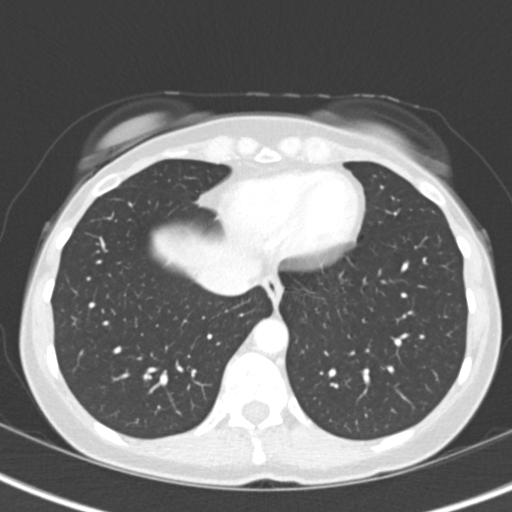
[im 70/163  lung]
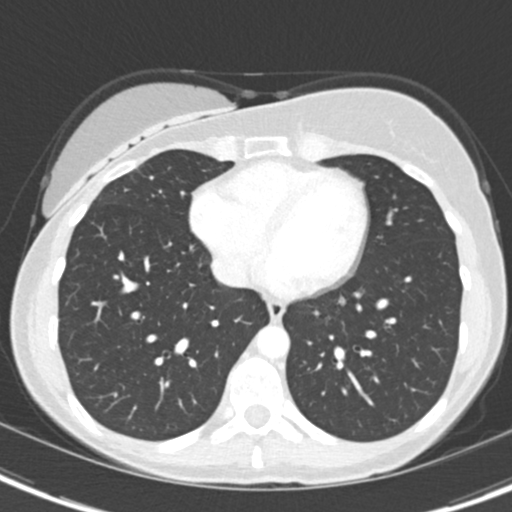
[im 93/163  lung]
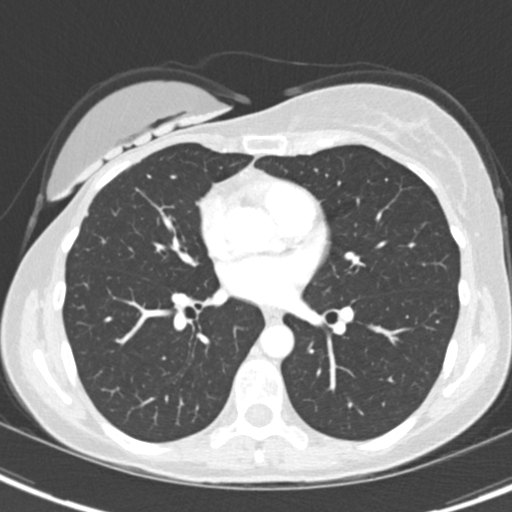
[im 105/163  lung]
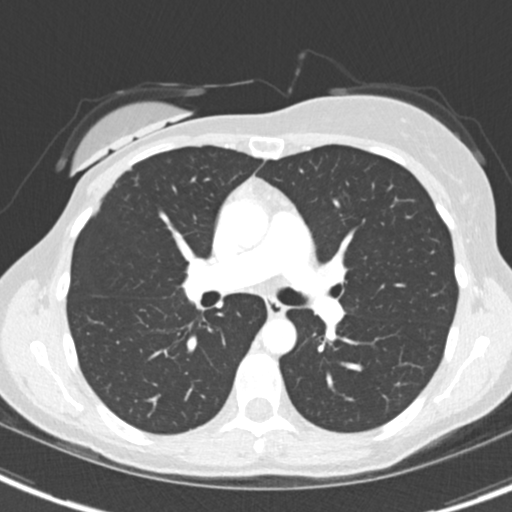
[im 116/163  mediastinal]
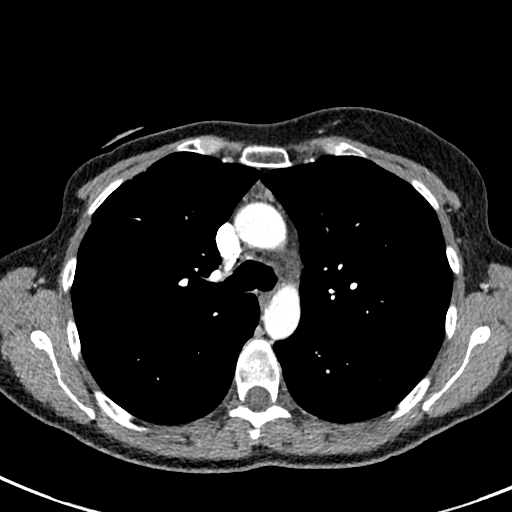
[im 116/163  lung]
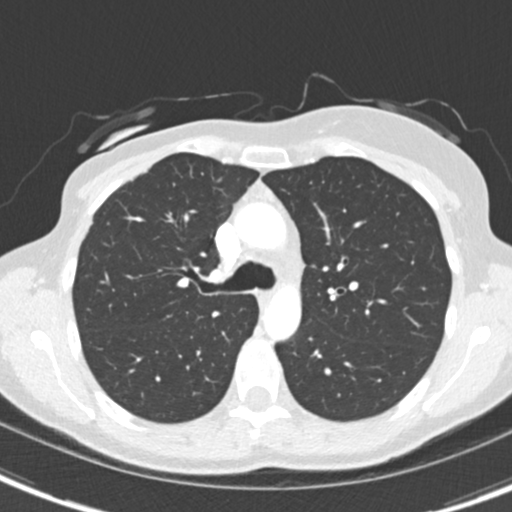
[im 128/163  lung]
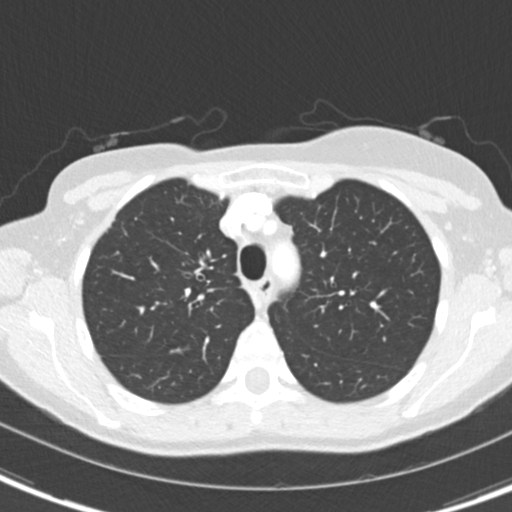
[im 139/163  lung]
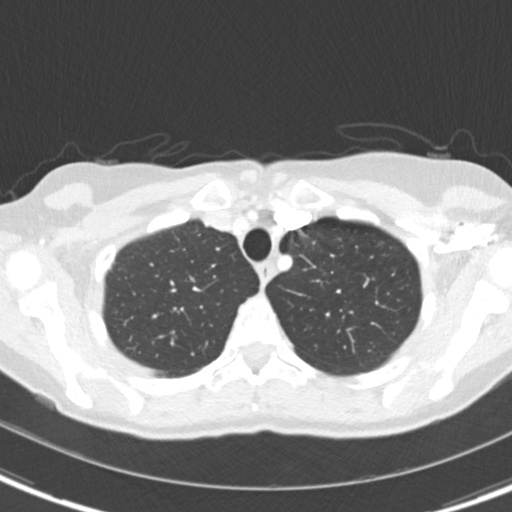
[im 151/163  lung]
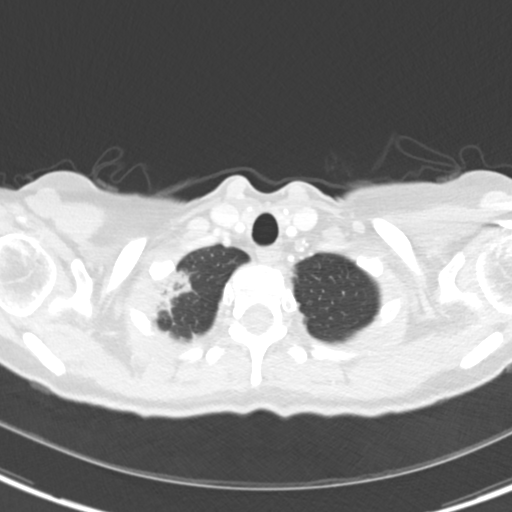

[Series 5: coronal · coronal · 0.72mm/px · 3 of 104 slices shown]
[im 21/104  lung]
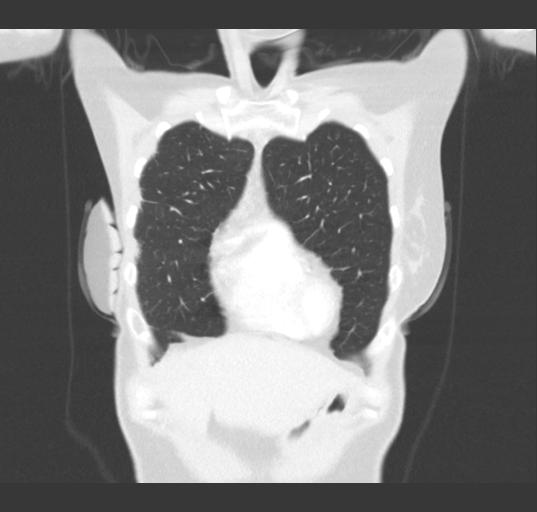
[im 42/104  lung]
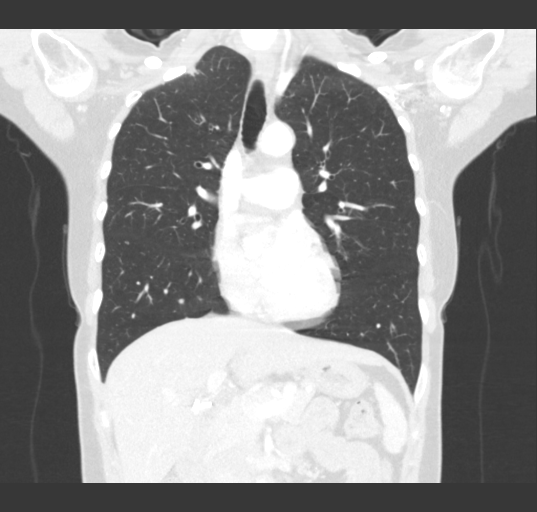
[im 62/104  lung]
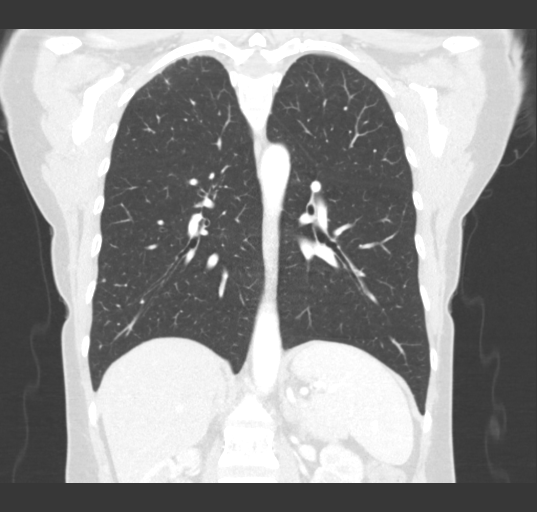

[15 of 36 positions shown; findings below may reference images not displayed]

FINDINGS: Cardiovascular: The heart is normal in size. No pericardial
effusion. The aorta is normal in caliber. No dissection. No
atherosclerotic calcifications. No coronary artery calcifications.

Mediastinum/Nodes: No mediastinal or hilar mass or lymphadenopathy.
There is some residual thymic tissue noted in the anterior
mediastinum which is stable. The esophagus is grossly.

Lungs/Pleura: A few scattered small subpleural nodules are stable
and likely lymph nodes. No worrisome pulmonary lesions for findings
suspicious for pulmonary metastatic disease. Stable apical pleural
thickening on the right side. No pleural effusions or pleural
nodules.

Upper Abdomen: No significant upper abdominal findings. No upper
abdominal metastatic disease. The gallbladder is surgically absent.
No biliary dilatation.

Musculoskeletal: Status post right mastectomy. No left breast
masses. No supraclavicular or axillary adenopathy the. The bony
thorax is intact. No worrisome lytic or sclerotic bone lesions to
suggest metastatic disease.
IMPRESSION: 1. Stable small scattered subpleural nodules, likely lymph nodes. No
findings suspicious for pulmonary metastatic disease. Attention on
future scans is suggested.
2. No mediastinal or hilar mass or adenopathy.
3. No findings for upper abdominal metastatic disease.
4. Status post right mastectomy. No findings for osseous metastatic
disease.
5. Aortic atherosclerosis.

Aortic Atherosclerosis ([id number].R).

## 2022-11-19 NOTE — Addendum Note (Signed)
Encounter addended by: Micki Riley, RN on: 11/19/2022 11:11 AM  Actions taken: Imaging Exam ended

## 2022-12-25 ENCOUNTER — Ambulatory Visit
Admission: RE | Admit: 2022-12-25 | Discharge: 2022-12-25 | Disposition: A | Discharge status: Home / Self Care | Payer: Medicare HMO | Source: Ambulatory Visit | Attending: Endocrinology | Admitting: Endocrinology

## 2022-12-25 DIAGNOSIS — E041 Nontoxic single thyroid nodule: Secondary | ICD-10-CM

## 2023-02-20 ENCOUNTER — Ambulatory Visit: Payer: Medicare HMO

## 2023-04-08 ENCOUNTER — Ambulatory Visit
Admission: RE | Admit: 2023-04-08 | Discharge: 2023-04-08 | Disposition: A | Discharge status: Home / Self Care | Payer: Medicare HMO | Source: Ambulatory Visit | Attending: Hematology and Oncology | Admitting: Hematology and Oncology

## 2023-04-08 DIAGNOSIS — C50511 Malignant neoplasm of lower-outer quadrant of right female breast: Secondary | ICD-10-CM

## 2023-08-12 ENCOUNTER — Inpatient Hospital Stay: Payer: Medicare HMO | Attending: Adult Health | Admitting: Adult Health

## 2023-08-12 ENCOUNTER — Encounter: Payer: Self-pay | Admitting: Adult Health

## 2023-08-12 ENCOUNTER — Other Ambulatory Visit: Payer: Self-pay

## 2023-08-12 ENCOUNTER — Inpatient Hospital Stay: Payer: Medicare HMO

## 2023-08-12 VITALS — BP 115/74 | HR 81 | Temp 97.7°F | Resp 18 | Ht 61.0 in | Wt 126.7 lb

## 2023-08-12 DIAGNOSIS — R918 Other nonspecific abnormal finding of lung field: Secondary | ICD-10-CM | POA: Insufficient documentation

## 2023-08-12 DIAGNOSIS — C50511 Malignant neoplasm of lower-outer quadrant of right female breast: Secondary | ICD-10-CM

## 2023-08-12 DIAGNOSIS — Z853 Personal history of malignant neoplasm of breast: Secondary | ICD-10-CM | POA: Diagnosis present

## 2023-08-12 DIAGNOSIS — Z923 Personal history of irradiation: Secondary | ICD-10-CM | POA: Diagnosis not present

## 2023-08-12 DIAGNOSIS — H547 Unspecified visual loss: Secondary | ICD-10-CM | POA: Diagnosis not present

## 2023-08-12 DIAGNOSIS — N399 Disorder of urinary system, unspecified: Secondary | ICD-10-CM | POA: Insufficient documentation

## 2023-08-12 DIAGNOSIS — Z9221 Personal history of antineoplastic chemotherapy: Secondary | ICD-10-CM | POA: Insufficient documentation

## 2023-08-12 DIAGNOSIS — Z808 Family history of malignant neoplasm of other organs or systems: Secondary | ICD-10-CM | POA: Insufficient documentation

## 2023-08-12 DIAGNOSIS — H539 Unspecified visual disturbance: Secondary | ICD-10-CM

## 2023-08-12 DIAGNOSIS — M549 Dorsalgia, unspecified: Secondary | ICD-10-CM | POA: Diagnosis not present

## 2023-08-12 DIAGNOSIS — Z803 Family history of malignant neoplasm of breast: Secondary | ICD-10-CM | POA: Insufficient documentation

## 2023-08-12 DIAGNOSIS — Z9011 Acquired absence of right breast and nipple: Secondary | ICD-10-CM | POA: Insufficient documentation

## 2023-08-12 LAB — URINALYSIS, COMPLETE (UACMP) WITH MICROSCOPIC
Bilirubin Urine: NEGATIVE
Glucose, UA: NEGATIVE mg/dL
Hgb urine dipstick: NEGATIVE
Ketones, ur: NEGATIVE mg/dL
Leukocytes,Ua: NEGATIVE
Nitrite: NEGATIVE
Protein, ur: NEGATIVE mg/dL
Specific Gravity, Urine: 1.025 (ref 1.005–1.030)
pH: 5 (ref 5.0–8.0)

## 2023-08-12 LAB — CMP (CANCER CENTER ONLY)
ALT: 10 U/L (ref 0–44)
AST: 18 U/L (ref 15–41)
Albumin: 4.4 g/dL (ref 3.5–5.0)
Alkaline Phosphatase: 71 U/L (ref 38–126)
Anion gap: 5 (ref 5–15)
BUN: 11 mg/dL (ref 6–20)
CO2: 30 mmol/L (ref 22–32)
Calcium: 9.6 mg/dL (ref 8.9–10.3)
Chloride: 107 mmol/L (ref 98–111)
Creatinine: 0.67 mg/dL (ref 0.44–1.00)
GFR, Estimated: >60 mL/min (ref >60)
Glucose, Bld: 93 mg/dL (ref 70–99)
Potassium: 4.7 mmol/L (ref 3.5–5.1)
Sodium: 142 mmol/L (ref 135–145)
Total Bilirubin: 0.4 mg/dL (ref 0.3–1.2)
Total Protein: 7.6 g/dL (ref 6.5–8.1)

## 2023-08-12 LAB — CBC WITH DIFFERENTIAL (CANCER CENTER ONLY)
Abs Immature Granulocytes: 0.01 10*3/uL (ref 0.00–0.07)
Basophils Absolute: 0.1 10*3/uL (ref 0.0–0.1)
Basophils Relative: 1 %
Eosinophils Absolute: 0.3 10*3/uL (ref 0.0–0.5)
Eosinophils Relative: 5 %
HCT: 42.3 % (ref 36.0–46.0)
Hemoglobin: 13.9 g/dL (ref 12.0–15.0)
Immature Granulocytes: 0 %
Lymphocytes Relative: 20 %
Lymphs Abs: 1.1 10*3/uL (ref 0.7–4.0)
MCH: 31.3 pg (ref 26.0–34.0)
MCHC: 32.9 g/dL (ref 30.0–36.0)
MCV: 95.3 fL (ref 80.0–100.0)
Monocytes Absolute: 0.4 10*3/uL (ref 0.1–1.0)
Monocytes Relative: 7 %
Neutro Abs: 3.7 10*3/uL (ref 1.7–7.7)
Neutrophils Relative %: 67 %
Platelet Count: 196 10*3/uL (ref 150–400)
RBC: 4.44 MIL/uL (ref 3.87–5.11)
RDW: 13.2 % (ref 11.5–15.5)
WBC Count: 5.6 10*3/uL (ref 4.0–10.5)
nRBC: 0 % (ref 0.0–0.2)

## 2023-08-12 NOTE — Assessment & Plan Note (Signed)
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  She is status postmastectomy, adjuvant chemotherapy, adjuvant radiation therapy, and antiestrogen therapy with tamoxifen which she took for about 8 years.  Misty [name] does not have any clear-cut signs of breast cancer recurrence.  She will continue to undergo annual left breast screening mammograms next due in April 2025.  Her breast exam was benign today.  Pulmonary nodules: We will repeat CT chest with contrast.  We discussed how it would be beneficial to obtain this imaging and compare 1 CT to the other CT rather than a contrast and image modalities considering her history of invasive ductal and lobular carcinoma of the breast.  Temporary vision loss: This is concerning to me and somewhat unusual for her to experience.  I placed orders for MRI of the brain with and without contrast to further evaluate.  Urinary changes: We will obtain a CBC, c-Met, urinalysis, and urine culture.  Misty [name] will return for a telephone visit in about 2 to 3 weeks to discuss her imaging results and lab results.  At that point we will decide when we will follow- up with her.

## 2023-08-12 NOTE — Progress Notes (Signed)
[facility] Cancer Follow up:    Misty Specking, MD [address] [place] [address]   DIAGNOSIS:  Cancer Staging  Breast cancer of lower-outer quadrant of right female breast [facility]) Staging form: Breast, AJCC 7th Edition - Pathologic stage from 09/28/2014: Stage IIB (T2(m), N1a, cM0) - Unsigned Staged by: Pathologist Laterality: Right Multiple tumors: Yes - Clinical: Stage IIB (T2, N1, M0) - Signed by Misty Dell, MD on 11/17/2014   SUMMARY OF ONCOLOGIC HISTORY: Oncology History  Breast cancer of lower-outer quadrant of right female breast (HCC)  08/11/2014 Initial Biopsy   Right breast bx  (8 o'clock): IDC, grade 1-2.  ER+ (90%), PR+ (90%), HER- by FISH.  Ki67 11%.  Right breast bx (5 o'clock): IDC, grade 1-2, also with ADH and perineural invasion.  ER+ (99%), PR+ (95%), HER2- (ratio 1.82). Ki67 31%.    08/25/2014 Breast MRI   Right breast: Several discrete masses noted, the largest measuring 2.7 x 1.4 x 1.6 cm.  No evidence of left breast malignancy. No abnormal lymph nodes or evidence of chest wall invasion.    08/25/2014 Initial Diagnosis   Breast cancer of lower-outer quadrant of right female breast   08/26/2014 Miscellaneous   Genetic testing negative for ATM, BARD1, BRCA1, BRCA2, BRIP1, CDH1, CHEK2, MRE11A, MUTYH, NBN, NF1, PALB2, PTEN, RAD50, RAD51C, RAD51D, and TP53.    09/28/2014 Surgery   Right mastectomy with SLNB Misty [name]): Invasive ducto-lobular cancer spanning 2.5 cm; grade 2. Second mass showed invasive ducto-lobular ca spanning 0.9 cm; grade 2. Negative margins. (+)LVI. Right axillary LNs 2/6 positive for mets [1 micromet, 1 macromet]   09/28/2014 Pathologic Stage   mpT2, pN1a, pMx: Stage IIB   10/27/2014 Echocardiogram   Pre-chemo EF: 50-55%.    11/08/2014 - 12/30/2014 Adjuvant Chemotherapy   Adriamycin/Cytoxan x 4 cycles.    01/21/2015 - 02/18/2015 Adjuvant Chemotherapy   Taxol x 5 cycles. (unable to complete 12 planned cycles d/t peripheral  neuropathy).    04/18/2015 - 05/26/2015 Radiation Therapy   Adjuvant RT completed Misty [name]): Right chest wall/50.4 Misty [name] @ 1.8 Misty [name] per fraction x 28 fractions Right supraclavicular fossa/PAB 45 Gy @1 .8 Gy per fraction x 25 fractions   06/2015 -  Anti-estrogen oral therapy   Tamoxifen daily. Planned duration of treatment: 5-10 years   08/30/2015 Survivorship   Survivorship care plan mailed to pt.      CURRENT THERAPY: Observation  INTERVAL HISTORY: Misty [name] 47 y.o. female returns for follow-up of her history of breast cancer.  Her most recent mammogram occurred on April 09, 2023 demonstrating no mammographic evidence of malignancy and breast density category C.  Most recent CT imaging on June 15, 2022 demonstrated pulmonary nodules 3 mm stable when compared to prior PET scan from October 25, 2020 which was consistent with a benign finding.  At her visit with Dr. Al [name] last year she endorsed lower back and hip pain.  X-rays were obtained after that visit and they were negative.  She is following with Dr. Talmage [name] about her thyroid nodules.  She notes that over the past year she will occasionally smell smoke when none is present.  She also endorses some continued back pain and noting bubbles in her urine.  She has not brought this issue for her to her primary care provider and notes that it has been going on for some time.  She says that she never feels good.  She had a blinding spell where she lost vision in her left  eye.  She then said her vision changed in her left eye where she felt like she was looking through a kaleidescope.  The blinding spell lasted for about five minutes.  She also has been dropping things more frequently.  Her most recent brain MRI occurred on 06/05/2021.  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  MEDICAL HISTORY: Past Medical History:  Diagnosis Date   Anxiety    situational   Cancer (HCC)    breast cancer   Celiac disease    Headache(784.0)    Hypothyroidism    PONV (postoperative nausea and vomiting)    Radiation 04/18/15-05/26/15   right breast   Thyroid disease     SURGICAL HISTORY: Past Surgical History:  Procedure Laterality Date   CESAREAN SECTION     CHOLECYSTECTOMY     GALLBLADDER SURGERY     LAPAROSCOPIC ASSISTED VAGINAL HYSTERECTOMY N/A 09/27/2015   Procedure: LAPAROSCOPIC ASSISTED VAGINAL HYSTERECTOMY;  Surgeon: Misty Aland, MD;  Location: [facility];  Service: Gynecology;  Laterality: N/A;   LAPAROSCOPIC BILATERAL SALPINGO OOPHERECTOMY Bilateral 09/27/2015   Procedure: LAPAROSCOPIC BILATERAL SALPINGO OOPHORECTOMY;  Surgeon: Misty Aland, MD;  Location: [facility];  Service: Gynecology;  Laterality: Bilateral;   MASTECTOMY Right 2015   malignant   PORT-A-CATH REMOVAL N/A 09/27/2015   Procedure: REMOVAL PORT-A-CATH;  Surgeon: Misty Kelp, MD;  Location: [facility];  Service: General;  Laterality: N/A;  Porta Cath removed intact without defects   PORTACATH PLACEMENT Right 10/22/2014   Procedure: INSERTION PORT-A-CATH;  Surgeon: Misty Kelp, MD;  Location: [facility];  Service: General;  Laterality: Right;   SIMPLE MASTECTOMY WITH AXILLARY SENTINEL NODE BIOPSY Right 09/28/2014   Procedure: RIGHT TOTAL MASTECTOMY, RIGHT   AXILLARY SENTINEL NODE BIOPSY;  Surgeon: Misty Kelp, MD;  Location: [facility];  Service: General;  Laterality: Right;    SOCIAL HISTORY: Social History   Socioeconomic History   Marital status: Married    Spouse name: Not on file   Number of children: 1   Years of education: Not on file   Highest education level: Not on file  Occupational History    Comment: [employer]  Tobacco Use   Smoking status: Never   Smokeless tobacco: Never  Vaping Use   Vaping status: Never Used  Substance and Sexual Activity   Alcohol use: No    Alcohol/week: 0.0 standard drinks of alcohol   Drug use: No   Sexual activity: Yes  Other Topics Concern   Not on file  Social History Narrative   Not on file   Social Determinants of Health   Financial Resource Strain: Not on file  Food Insecurity: Not on file  Transportation Needs: Not on file  Physical Activity: Not on file  Stress: Not on file  Social Connections:  Not on file  Intimate Partner Violence: Not on file    FAMILY HISTORY: Family History  Problem Relation Age of Onset   Skin cancer Father    Breast cancer Maternal Grandmother        dx unknown age   Breast cancer Other 58       mat great aunt through [facility] with breast cancer    Review of Systems  Constitutional:  Positive for fatigue. Negative for appetite change, chills, fever and unexpected weight change.  HENT:   Negative for hearing loss, lump/mass, mouth sores, nosebleeds, sore throat and trouble swallowing.   Eyes:  Negative for eye problems and icterus.  Respiratory:  Negative for chest tightness, cough and shortness of breath.   Cardiovascular:  Negative for chest pain, leg swelling and palpitations.  Gastrointestinal:  Negative for abdominal distention, abdominal pain, constipation, diarrhea, nausea and vomiting.  Endocrine: Negative for hot flashes.  Genitourinary:  Negative for difficulty urinating.   Musculoskeletal:  Negative  for arthralgias.  Skin:  Negative for itching and rash.  Neurological:  Negative for dizziness, extremity weakness, headaches and numbness.  Hematological:  Negative for adenopathy. Does not bruise/bleed easily.  Psychiatric/Behavioral:  Negative for depression. The patient is not nervous/anxious.       PHYSICAL EXAMINATION   Onc Performance Status - 08/12/23 0849       ECOG Perf Status   ECOG Perf Status Restricted in physically strenuous activity but ambulatory and able to carry out work of a light or sedentary nature, e.g., light house work, office work      KPS SCALE   KPS % SCORE Normal activity with effort, some s/s of disease             Vitals:   08/12/23 0845  BP: 115/74  Pulse: 81  Resp: 18  Temp: 97.7 F (36.5 C)  SpO2: 100%    Physical Exam Constitutional:      General: She is not in acute distress.    Appearance: Normal appearance. She is not toxic-appearing.  HENT:     Head: Normocephalic and atraumatic.     Mouth/Throat:     Mouth: Mucous membranes are moist.     Pharynx: Oropharynx is clear. No oropharyngeal exudate or posterior oropharyngeal erythema.  Eyes:     General: No scleral icterus. Cardiovascular:     Rate and Rhythm: Normal rate and regular rhythm.     Pulses: Normal pulses.     Heart sounds: Normal heart sounds.  Pulmonary:     Effort: Pulmonary effort is normal.     Breath sounds: Normal breath sounds.  Chest:     Comments: Right breast s/p mastectomy, no sign of local recurrence, left breast benign Abdominal:     General: Abdomen is flat. Bowel sounds are normal. There is no distension.     Palpations: Abdomen is soft.     Tenderness: There is no abdominal tenderness.  Musculoskeletal:        General: No swelling.     Cervical back: Neck supple.  Lymphadenopathy:     Cervical: No cervical adenopathy.  Skin:    General: Skin is warm and dry.     Findings: No rash.  Neurological:     General: No focal deficit present.      Mental Status: She is alert.  Psychiatric:        Mood and Affect: Mood normal.        Behavior: Behavior normal.  LABORATORY DATA:  CBC    Component Value Date/Time   WBC 4.9 07/06/2021 0849   WBC 6.9 02/09/2020 1403   RBC 4.19 07/06/2021 0849   HGB 13.0 07/06/2021 0849   HGB 14.2 10/17/2016 0850   HCT 39.2 07/06/2021 0849   HCT 43.0 10/17/2016 0850   PLT 172 07/06/2021 0849   PLT 168 10/17/2016 0850   MCV 93.6 07/06/2021 0849   MCV 93.5 10/17/2016 0850   MCH 31.0 07/06/2021 0849   MCHC 33.2 07/06/2021 0849   RDW 13.1 07/06/2021 0849   RDW 13.4 10/17/2016 0850   LYMPHSABS 1.0 07/06/2021 0849   LYMPHSABS 0.9 10/17/2016 0850   MONOABS 0.4 07/06/2021 0849   MONOABS 0.4 10/17/2016 0850   EOSABS 0.3 07/06/2021 0849   EOSABS 0.2 10/17/2016 0850   BASOSABS 0.0 07/06/2021 0849   BASOSABS 0.0 10/17/2016 0850    CMP     Component Value Date/Time   NA 140 10/26/2022 1158   NA 143 10/17/2016 0850   K 4.6 10/26/2022 1158   K 4.9 10/17/2016 0850   CL 105 10/26/2022 1158   CO2 29 10/26/2022 1158   CO2 28 10/17/2016 0850   GLUCOSE 89 10/26/2022 1158   GLUCOSE 98 10/17/2016 0850   BUN 16 10/26/2022 1158   BUN 17.0 10/17/2016 0850   CREATININE 0.63 10/26/2022 1158   CREATININE 0.72 07/06/2021 0849   CREATININE 0.7 10/17/2016 0850   CALCIUM 9.6 10/26/2022 1158   CALCIUM 10.2 10/17/2016 0850   PROT 7.2 07/06/2021 0849   PROT 7.5 10/17/2016 0850   ALBUMIN 3.9 07/06/2021 0849   ALBUMIN 4.0 10/17/2016 0850   AST 19 07/06/2021 0849   AST 21 10/17/2016 0850   ALT 15 07/06/2021 0849   ALT 16 10/17/2016 0850   ALKPHOS 67 07/06/2021 0849   ALKPHOS 107 10/17/2016 0850   BILITOT 0.4 07/06/2021 0849   BILITOT 0.44 10/17/2016 0850   GFRNONAA >60 10/26/2022 1158   GFRNONAA >60 07/06/2021 0849   GFRAA >60 09/16/2020 1144     ASSESSMENT and THERAPY PLAN:   Breast cancer of lower-outer quadrant of right female breast Misty [name] is a 47 year old woman with history of stage  IIb ER/PR positive right breast invasive ductal carcinoma diagnosed in 2015.  She is status postmastectomy, adjuvant chemotherapy, adjuvant radiation therapy, and antiestrogen therapy with tamoxifen which she took for about 8 years.  Misty [name] does not have any clear-cut signs of breast cancer recurrence.  She will continue to undergo annual left breast screening mammograms next due in April 2025.  Her breast exam was benign today.  Pulmonary nodules: We will repeat CT chest with contrast.  We discussed how it would be beneficial to obtain this imaging and compare 1 CT to the other CT rather than a contrast and image modalities considering her history of invasive ductal and lobular carcinoma of the breast.  Temporary vision loss: This is concerning to me and somewhat unusual for her to experience.  I placed orders for MRI of the brain with and without contrast to further evaluate.  Urinary changes: We will obtain a CBC, c-Met, urinalysis, and urine culture.  Irais will return for a telephone visit in about 2 to 3 weeks to discuss her imaging results and lab results.  At that point we will decide when we will follow- up with her.    All questions were answered. The patient knows to call the clinic with any problems, questions or concerns. We can certainly see the patient much sooner  if necessary.  Total encounter time:30 minutes*in face-to-face visit time, chart review, lab review, care coordination, order entry, and documentation of the encounter time.    Misty Anes, NP 08/12/23 9:48 AM Medical Oncology and Hematology [facility] [address] [place], [place] [address] Tel. [phone]    Fax. [phone]  *Total Encounter Time as defined by the Centers for Medicare and Medicaid Services includes, in addition to the face-to-face time of a patient visit (documented in the note above) non-face-to-face time: obtaining and reviewing outside history, ordering and reviewing  medications, tests or procedures, care coordination (communications with other health care professionals or caregivers) and documentation in the medical record.

## 2023-08-13 ENCOUNTER — Telehealth: Payer: Self-pay | Admitting: Adult Health

## 2023-08-13 LAB — URINE CULTURE: Culture: <10000 — AB

## 2023-08-13 NOTE — Telephone Encounter (Signed)
-----   Message from Noreene Filbert sent at 08/12/2023  1:59 PM EDT ----- Labs so far are good.  We are waiting for the final results from urine culture which will result later this week. ----- Message ----- From: Interface, Lab In [place] Sent: 08/12/2023   9:54 AM EDT To: Loa Socks, NP

## 2023-08-13 NOTE — Telephone Encounter (Signed)
Per Lillard Anes, DNP, called pt w/message below. Pt verbalized understanding.

## 2023-08-23 ENCOUNTER — Telehealth: Payer: Self-pay | Admitting: *Deleted

## 2023-08-23 NOTE — Telephone Encounter (Signed)
Pt left VM inquiring about " the additional testing done on my urine last week"  Noted urine was sent for culture with insignificant growth resulting.  Called pt - obtained identified VM- message left per above.

## 2023-08-27 ENCOUNTER — Other Ambulatory Visit: Payer: Medicare HMO

## 2023-09-02 ENCOUNTER — Inpatient Hospital Stay: Payer: Medicare HMO | Attending: Adult Health | Admitting: Adult Health

## 2023-09-02 DIAGNOSIS — C50511 Malignant neoplasm of lower-outer quadrant of right female breast: Secondary | ICD-10-CM

## 2023-09-02 NOTE — Progress Notes (Signed)
Patient had phone visit with me today however this was supposed to have been scheduled for after her imaging dates for brain MRI and CT chest.  I sent a scheduling message to reschedule this visit for the week of October 7.  I reviewed this with Alla in detail we also discussed her labs briefly which were normal.  She is in agreement with the above plan.

## 2023-09-04 ENCOUNTER — Ambulatory Visit
Admission: RE | Admit: 2023-09-04 | Discharge: 2023-09-04 | Disposition: A | Discharge status: Home / Self Care | Payer: Medicare HMO | Source: Ambulatory Visit | Attending: Adult Health | Admitting: Adult Health

## 2023-09-04 DIAGNOSIS — R918 Other nonspecific abnormal finding of lung field: Secondary | ICD-10-CM

## 2023-09-04 DIAGNOSIS — C50511 Malignant neoplasm of lower-outer quadrant of right female breast: Secondary | ICD-10-CM

## 2023-09-04 MED ORDER — IOPAMIDOL (ISOVUE-300) INJECTION 61%
200.0000 mL | Freq: Once | INTRAVENOUS | Status: AC | PRN
  Administered 2023-09-04: 80 mL via INTRAVENOUS

## 2023-09-13 ENCOUNTER — Telehealth: Payer: Self-pay

## 2023-09-13 NOTE — Telephone Encounter (Signed)
Pt LVM stating that she had questions and to please return her call. This RN called pt back. Pt stated that she had a CT done on 09/04/23 and cannot see the results in MyChart. This RN found that the scan has not been read by the radiologist and that is why she can't see it. This RN let pt know that she will call the radiology reading room to get a reading as soon as possible. Pt verbalized thanks and understanding and had no further questions at this time.

## 2023-09-18 ENCOUNTER — Telehealth: Payer: Self-pay

## 2023-09-18 NOTE — Telephone Encounter (Signed)
Pt called to inform her that her CT scan is negative per Lillard Anes, NP. This RN confirmed with pt that she has an appointment with Mardella Layman on 10/7. Pt stated understanding.

## 2023-09-23 ENCOUNTER — Ambulatory Visit
Admission: RE | Admit: 2023-09-23 | Discharge: 2023-09-23 | Disposition: A | Discharge status: Home / Self Care | Payer: Medicare HMO | Source: Ambulatory Visit | Attending: Adult Health | Admitting: Adult Health

## 2023-09-23 DIAGNOSIS — H539 Unspecified visual disturbance: Secondary | ICD-10-CM

## 2023-09-23 DIAGNOSIS — C50511 Malignant neoplasm of lower-outer quadrant of right female breast: Secondary | ICD-10-CM

## 2023-09-23 MED ORDER — GADOPICLENOL 0.5 MMOL/ML IV SOLN
6.5000 mL | Freq: Once | INTRAVENOUS | Status: AC | PRN
  Administered 2023-09-23: 6.5 mL via INTRAVENOUS

## 2023-09-30 ENCOUNTER — Inpatient Hospital Stay: Payer: Medicare HMO | Attending: Adult Health | Admitting: Adult Health

## 2023-09-30 DIAGNOSIS — C50511 Malignant neoplasm of lower-outer quadrant of right female breast: Secondary | ICD-10-CM | POA: Diagnosis not present

## 2023-09-30 DIAGNOSIS — R911 Solitary pulmonary nodule: Secondary | ICD-10-CM

## 2023-09-30 NOTE — Progress Notes (Signed)
[facility] Cancer Follow up:    Misty Specking, MD [address] [place] [address]   DIAGNOSIS:  Cancer Staging  Breast cancer of lower-outer quadrant of right female breast [facility]) Staging form: Breast, AJCC 7th Edition - Pathologic stage from 09/28/2014: Stage IIB (T2(m), N1a, cM0) - Unsigned Staged by: Pathologist Laterality: Right Multiple tumors: Yes - Clinical: Stage IIB (T2, N1, M0) - Signed by Lowella Dell, MD on 11/17/2014 I connected with Misty [name] on 09/30/23 at  9:15 AM EDT by telephone and verified that I am speaking with the correct person using two identifiers.  I discussed the limitations, risks, security and privacy concerns of performing an evaluation and management service by telephone and the availability of in person appointments.  I also discussed with the patient that there may be a patient responsible charge related to this service. The patient expressed understanding and agreed to proceed.   SUMMARY OF ONCOLOGIC HISTORY: Oncology History  Breast cancer of lower-outer quadrant of right female breast (HCC)  08/11/2014 Initial Biopsy   Right breast bx  (8 o'clock): IDC, grade 1-2.  ER+ (90%), PR+ (90%), HER- by FISH.  Ki67 11%.  Right breast bx (5 o'clock): IDC, grade 1-2, also with ADH and perineural invasion.  ER+ (99%), PR+ (95%), HER2- (ratio 1.82). Ki67 31%.    08/25/2014 Breast MRI   Right breast: Several discrete masses noted, the largest measuring 2.7 x 1.4 x 1.6 cm.  No evidence of left breast malignancy. No abnormal lymph nodes or evidence of chest wall invasion.    08/25/2014 Initial Diagnosis   Breast cancer of lower-outer quadrant of right female breast   08/26/2014 Miscellaneous   Genetic testing negative for ATM, BARD1, BRCA1, BRCA2, BRIP1, CDH1, CHEK2, MRE11A, MUTYH, NBN, NF1, PALB2, PTEN, RAD50, RAD51C, RAD51D, and TP53.    09/28/2014 Surgery   Right mastectomy with SLNB Misty [name]): Invasive ducto-lobular cancer spanning 2.5 cm;  grade 2. Second mass showed invasive ducto-lobular ca spanning 0.9 cm; grade 2. Negative margins. (+)LVI. Right axillary LNs 2/6 positive for mets [1 micromet, 1 macromet]   09/28/2014 Pathologic Stage   mpT2, pN1a, pMx: Stage IIB   10/27/2014 Echocardiogram   Pre-chemo EF: 50-55%.    11/08/2014 - 12/30/2014 Adjuvant Chemotherapy   Adriamycin/Cytoxan x 4 cycles.    01/21/2015 - 02/18/2015 Adjuvant Chemotherapy   Taxol x 5 cycles. (unable to complete 12 planned cycles d/t peripheral neuropathy).    04/18/2015 - 05/26/2015 Radiation Therapy   Adjuvant RT completed Misty [name]): Right chest wall/50.4 Misty [name] @ 1.8 Misty [name] per fraction x 28 fractions Right supraclavicular fossa/PAB 45 Gy @1 .8 Gy per fraction x 25 fractions   06/2015 -  Anti-estrogen oral therapy   Tamoxifen daily. Planned duration of treatment: 5-10 years   08/30/2015 Survivorship   Survivorship care plan mailed to pt.      CURRENT THERAPY: observation  INTERVAL HISTORY: Misty [name] 47 y.o. female returns for f/u to discuss her recent imaging results.  She underwent CT chest 09/04/2023.  She is concerned that her pulmonary nodule has been slowly increasing over time.  She underwent MRI of the brain on 09/23/2023.     Patient Active Problem List   Diagnosis Date Noted   Hyperlipidemia 10/26/2022   Persistent headaches 03/14/2021   Empty sella (HCC) 10/29/2016   Genetic testing 10/28/2015   Inverted nipple 08/25/2015   Hot flashes 08/25/2015   Edema of upper extremity 04/12/2015   Chemotherapy-induced neuropathy (HCC) 02/18/2015  Nausea 12/02/2014   Anorexia 12/02/2014   Weight loss 12/02/2014   Constipation 12/02/2014   Fatigue 12/02/2014   Hypotension 12/02/2014   Urinary frequency 12/02/2014   Dehydration 12/01/2014   Celiac disease 09/02/2014   Family history of malignant neoplasm of breast 08/26/2014   Breast cancer of lower-outer quadrant of right female breast (HCC) 08/25/2014    is allergic to gluten meal and  penicillins.  MEDICAL HISTORY: Past Medical History:  Diagnosis Date   Anxiety    situational   Cancer (HCC)    breast cancer   Celiac disease    Headache(784.0)    Hypothyroidism    PONV (postoperative nausea and vomiting)    Radiation 04/18/15-05/26/15   right breast   Thyroid disease     SURGICAL HISTORY: Past Surgical History:  Procedure Laterality Date   CESAREAN SECTION     CHOLECYSTECTOMY     GALLBLADDER SURGERY     LAPAROSCOPIC ASSISTED VAGINAL HYSTERECTOMY N/A 09/27/2015   Procedure: LAPAROSCOPIC ASSISTED VAGINAL HYSTERECTOMY;  Surgeon: Levi Aland, MD;  Location: [facility];  Service: Gynecology;  Laterality: N/A;   LAPAROSCOPIC BILATERAL SALPINGO OOPHERECTOMY Bilateral 09/27/2015   Procedure: LAPAROSCOPIC BILATERAL SALPINGO OOPHORECTOMY;  Surgeon: Levi Aland, MD;  Location: [facility];  Service: Gynecology;  Laterality: Bilateral;   MASTECTOMY Right 2015   malignant   PORT-A-CATH REMOVAL N/A 09/27/2015   Procedure: REMOVAL PORT-A-CATH;  Surgeon: Claud Kelp, MD;  Location: [facility];  Service: General;  Laterality: N/A;  Porta Cath removed intact without defects   PORTACATH PLACEMENT Right 10/22/2014   Procedure: INSERTION PORT-A-CATH;  Surgeon: Claud Kelp, MD;  Location: [facility];  Service: General;  Laterality: Right;   SIMPLE MASTECTOMY WITH AXILLARY SENTINEL NODE BIOPSY Right 09/28/2014   Procedure: RIGHT TOTAL MASTECTOMY, RIGHT  AXILLARY SENTINEL NODE BIOPSY;  Surgeon: Claud Kelp, MD;  Location: [facility];  Service: General;  Laterality: Right;    SOCIAL HISTORY: Social History   Socioeconomic History   Marital status: Married    Spouse name: Not on file   Number of children: 1   Years of education: Not on file   Highest education level: Not on file  Occupational History    Comment: [employer]  Tobacco Use   Smoking status: Never   Smokeless tobacco: Never  Vaping Use   Vaping status: Never  Used  Substance and Sexual Activity   Alcohol use: No    Alcohol/week: 0.0 standard drinks of alcohol   Drug use: No   Sexual activity: Yes  Other Topics Concern   Not on file  Social History Narrative   Not on file   Social Determinants of Health   Financial Resource Strain: Not on file  Food Insecurity: Not on file  Transportation Needs: Not on file  Physical Activity: Not on file  Stress: Not on file  Social Connections: Not on file  Intimate Partner Violence: Not on file    FAMILY HISTORY: Family History  Problem Relation Age of Onset   Skin cancer Father    Breast cancer Maternal Grandmother        dx unknown age   Breast cancer Other 57       mat great aunt through [facility] with breast cancer    Review of Systems  Constitutional:  Positive for fatigue. Negative for appetite change, chills, fever and unexpected weight change.  HENT:   Negative for hearing loss, lump/mass and trouble swallowing.   Eyes:  Negative  for eye problems and icterus.  Respiratory:  Negative for chest tightness, cough and shortness of breath.   Cardiovascular:  Negative for chest pain, leg swelling and palpitations.  Gastrointestinal:  Negative for abdominal distention, abdominal pain, constipation, diarrhea, nausea and vomiting.  Endocrine: Negative for hot flashes.  Genitourinary:  Negative for difficulty urinating.   Musculoskeletal:  Negative for arthralgias.  Skin:  Negative for itching and rash.  Neurological:  Negative for dizziness, extremity weakness, headaches and numbness.  Hematological:  Negative for adenopathy. Does not bruise/bleed easily.  Psychiatric/Behavioral:  Negative for depression. The patient is not nervous/anxious.       PHYSICAL EXAMINATION Patient sounds well.  She is in no apparent distress.  Mood and behavior are normal.  Speech is normal.  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  She is status postmastectomy, adjuvant chemotherapy, adjuvant radiation therapy, and antiestrogen therapy with tamoxifen which she took for about 8 years.  Misty [name] has no radiographic signs of breast cancer recurrence.  This is good news.  Pulmonary nodules: These appear to be increasing in size by 1 mm/year.  I reviewed with her that at the current size PET scan has a decreased sensitivity additionally they are too small to biopsy at 4 mm in size.  We agree we will repeat CT chest in 1 year.  Empty sella: I forwarded her MRI results to her endocrinologist.  This is unchanged from prior.  She will follow-up with her endocrinologist on whether any additional hormone testing is needed.  Jameika will return in 1 year for CT chest followed by long-term survivorship visit with me.  Follow up instructions:    -Return to cancer center 1 year -CT chest 1 year  -Mammogram due in 03/2024  The patient was provided an opportunity to ask questions and all were answered. The patient agreed with the plan and demonstrated an understanding of the instructions.   The patient was advised to call back or seek an in-person evaluation if the symptoms worsen or if the condition fails to improve as anticipated.   I provided 15 minutes of non face-to-face telephone visit time during this encounter, and > 50% was spent counseling as documented under my assessment & plan.    Lillard Anes, NP 09/30/23 10:19 AM Medical Oncology and Hematology [facility] [address] [place], [place] [address] Tel. [phone]    Fax. [phone]  *Total Encounter Time as defined by the Centers for Medicare and Medicaid Services includes, in addition to the face-to-face time of a patient visit (documented in the note above) non-face-to-face time: obtaining and reviewing outside history, ordering and reviewing  medications, tests or procedures, care coordination (communications with other health care professionals or caregivers) and documentation in the medical record.

## 2023-09-30 NOTE — Assessment & Plan Note (Signed)
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  She is status postmastectomy, adjuvant chemotherapy, adjuvant radiation therapy, and antiestrogen therapy with tamoxifen which she took for about 8 years.  [place] has no radiographic signs of breast cancer recurrence.  This is good news.  Pulmonary nodules: These appear to be increasing in size by 1 mm/year.  I reviewed with her that at the current size PET scan has a decreased sensitivity additionally they are too small to biopsy at 4 mm in size.  We agree we will repeat CT chest in 1 year.  Empty sella: I forwarded her MRI results to her endocrinologist.  This is unchanged from prior.  She will follow-up with her endocrinologist on whether any additional hormone testing is needed.  Misty [name] will return in 1 year for CT chest followed by long-term survivorship visit with me.

## 2023-10-02 ENCOUNTER — Telehealth: Payer: Self-pay | Admitting: Adult Health

## 2023-10-02 NOTE — Telephone Encounter (Signed)
Per [facility] 10/7 LOS Patient is aware of scheduled appointment times/dates regarding follow up visit

## 2023-11-27 ENCOUNTER — Telehealth: Payer: Self-pay | Admitting: Internal Medicine

## 2023-11-27 NOTE — Telephone Encounter (Signed)
Error

## 2024-07-24 ENCOUNTER — Other Ambulatory Visit: Payer: Self-pay | Admitting: Hematology and Oncology

## 2024-07-24 DIAGNOSIS — Z1231 Encounter for screening mammogram for malignant neoplasm of breast: Secondary | ICD-10-CM

## 2024-08-14 ENCOUNTER — Ambulatory Visit
Admission: RE | Admit: 2024-08-14 | Discharge: 2024-08-14 | Disposition: A | Discharge status: Home / Self Care | Source: Ambulatory Visit | Attending: Hematology and Oncology | Admitting: Hematology and Oncology

## 2024-08-14 DIAGNOSIS — Z1231 Encounter for screening mammogram for malignant neoplasm of breast: Secondary | ICD-10-CM

## 2024-08-14 HISTORY — DX: Personal history of antineoplastic chemotherapy: Z92.21

## 2024-08-14 HISTORY — DX: Personal history of irradiation: Z92.3

## 2024-08-25 ENCOUNTER — Ambulatory Visit (HOSPITAL_COMMUNITY)

## 2024-08-31 ENCOUNTER — Ambulatory Visit (HOSPITAL_COMMUNITY)
Admission: RE | Admit: 2024-08-31 | Discharge: 2024-08-31 | Disposition: A | Discharge status: Home / Self Care | Source: Ambulatory Visit | Attending: Adult Health | Admitting: Adult Health

## 2024-08-31 DIAGNOSIS — C50511 Malignant neoplasm of lower-outer quadrant of right female breast: Secondary | ICD-10-CM | POA: Insufficient documentation

## 2024-08-31 DIAGNOSIS — R911 Solitary pulmonary nodule: Secondary | ICD-10-CM | POA: Diagnosis present

## 2024-08-31 MED ORDER — IOHEXOL 300 MG/ML  SOLN
100.0000 mL | Freq: Once | INTRAMUSCULAR | Status: AC | PRN
  Administered 2024-08-31: 100 mL via INTRAVENOUS

## 2024-08-31 MED ORDER — SODIUM CHLORIDE (PF) 0.9 % IJ SOLN
INTRAMUSCULAR | Status: AC
  Filled 2024-08-31: qty 50

## 2024-09-07 ENCOUNTER — Ambulatory Visit: Payer: Self-pay | Admitting: *Deleted

## 2024-09-07 NOTE — Telephone Encounter (Signed)
-----   Message from Morna JAYSON Kendall sent at 09/07/2024 10:29 AM EDT ----- Lung nodules are stable, this is good news, please let patient know and we can discuss further at her next appt with us  ----- Message ----- From: Interface, Rad Results In Sent: 09/03/2024   3:35 PM EDT To: Morna Dalton Kendall, NP

## 2024-09-07 NOTE — Telephone Encounter (Signed)
 Spoke with pt and informed pt of results  -  Lung nodules are stable, this is good news -  as per Morna, NP. NP will discuss further at her next f/u appt.   Pt voiced understanding.

## 2024-09-25 LAB — COLOGUARD: COLOGUARD: NEGATIVE

## 2024-09-30 ENCOUNTER — Encounter: Payer: Self-pay | Admitting: Adult Health

## 2024-09-30 ENCOUNTER — Inpatient Hospital Stay: Payer: Medicare HMO | Attending: Adult Health | Admitting: Adult Health

## 2024-09-30 ENCOUNTER — Inpatient Hospital Stay

## 2024-09-30 VITALS — BP 110/56 | HR 78 | Temp 97.9°F | Resp 18 | Ht 61.0 in

## 2024-09-30 DIAGNOSIS — E041 Nontoxic single thyroid nodule: Secondary | ICD-10-CM | POA: Insufficient documentation

## 2024-09-30 DIAGNOSIS — M810 Age-related osteoporosis without current pathological fracture: Secondary | ICD-10-CM | POA: Diagnosis not present

## 2024-09-30 DIAGNOSIS — C50511 Malignant neoplasm of lower-outer quadrant of right female breast: Secondary | ICD-10-CM | POA: Diagnosis not present

## 2024-09-30 DIAGNOSIS — Z853 Personal history of malignant neoplasm of breast: Secondary | ICD-10-CM | POA: Diagnosis not present

## 2024-09-30 DIAGNOSIS — I700 Atherosclerosis of aorta: Secondary | ICD-10-CM | POA: Diagnosis not present

## 2024-09-30 DIAGNOSIS — R000 Tachycardia, unspecified: Secondary | ICD-10-CM | POA: Diagnosis not present

## 2024-09-30 DIAGNOSIS — R7689 Other specified abnormal immunological findings in serum: Secondary | ICD-10-CM

## 2024-09-30 DIAGNOSIS — Z923 Personal history of irradiation: Secondary | ICD-10-CM | POA: Diagnosis not present

## 2024-09-30 DIAGNOSIS — Z803 Family history of malignant neoplasm of breast: Secondary | ICD-10-CM | POA: Diagnosis not present

## 2024-09-30 DIAGNOSIS — Z9221 Personal history of antineoplastic chemotherapy: Secondary | ICD-10-CM | POA: Diagnosis not present

## 2024-09-30 DIAGNOSIS — R918 Other nonspecific abnormal finding of lung field: Secondary | ICD-10-CM | POA: Diagnosis present

## 2024-09-30 DIAGNOSIS — Z9011 Acquired absence of right breast and nipple: Secondary | ICD-10-CM | POA: Diagnosis not present

## 2024-09-30 DIAGNOSIS — R5383 Other fatigue: Secondary | ICD-10-CM

## 2024-09-30 DIAGNOSIS — E559 Vitamin D deficiency, unspecified: Secondary | ICD-10-CM

## 2024-09-30 DIAGNOSIS — Z808 Family history of malignant neoplasm of other organs or systems: Secondary | ICD-10-CM | POA: Diagnosis not present

## 2024-09-30 DIAGNOSIS — M816 Localized osteoporosis [Lequesne]: Secondary | ICD-10-CM

## 2024-09-30 LAB — CMP (CANCER CENTER ONLY)
ALT: 9 U/L (ref 0–44)
AST: 17 U/L (ref 15–41)
Albumin: 4.8 g/dL (ref 3.5–5.0)
Alkaline Phosphatase: 67 U/L (ref 38–126)
Anion gap: 6 (ref 5–15)
BUN: 14 mg/dL (ref 6–20)
CO2: 29 mmol/L (ref 22–32)
Calcium: 10.1 mg/dL (ref 8.9–10.3)
Chloride: 106 mmol/L (ref 98–111)
Creatinine: 0.65 mg/dL (ref 0.44–1.00)
GFR, Estimated: >60 mL/min (ref >60)
Glucose, Bld: 86 mg/dL (ref 70–99)
Potassium: 4 mmol/L (ref 3.5–5.1)
Sodium: 141 mmol/L (ref 135–145)
Total Bilirubin: 0.5 mg/dL (ref 0.0–1.2)
Total Protein: 8 g/dL (ref 6.5–8.1)

## 2024-09-30 LAB — CBC WITH DIFFERENTIAL (CANCER CENTER ONLY)
Abs Immature Granulocytes: 0.02 K/uL (ref 0.00–0.07)
Basophils Absolute: 0.1 K/uL (ref 0.0–0.1)
Basophils Relative: 1 %
Eosinophils Absolute: 0.2 K/uL (ref 0.0–0.5)
Eosinophils Relative: 4 %
HCT: 42.2 % (ref 36.0–46.0)
Hemoglobin: 14.2 g/dL (ref 12.0–15.0)
Immature Granulocytes: 0 %
Lymphocytes Relative: 18 %
Lymphs Abs: 0.9 K/uL (ref 0.7–4.0)
MCH: 31.3 pg (ref 26.0–34.0)
MCHC: 33.6 g/dL (ref 30.0–36.0)
MCV: 93 fL (ref 80.0–100.0)
Monocytes Absolute: 0.3 K/uL (ref 0.1–1.0)
Monocytes Relative: 6 %
Neutro Abs: 3.8 K/uL (ref 1.7–7.7)
Neutrophils Relative %: 71 %
Platelet Count: 218 K/uL (ref 150–400)
RBC: 4.54 MIL/uL (ref 3.87–5.11)
RDW: 13.1 % (ref 11.5–15.5)
WBC Count: 5.2 K/uL (ref 4.0–10.5)
nRBC: 0 % (ref 0.0–0.2)

## 2024-09-30 LAB — VITAMIN B12: Vitamin B-12: 284 pg/mL (ref 180–914)

## 2024-09-30 LAB — C-REACTIVE PROTEIN: CRP: 0.6 mg/dL (ref <1.0)

## 2024-09-30 LAB — SEDIMENTATION RATE: Sed Rate: 8 mm/h (ref 0–22)

## 2024-09-30 NOTE — Progress Notes (Unsigned)
 [facility] Cancer Follow up:    Misty Leta NOVAK, MD [address] [place] [address]   DIAGNOSIS: Cancer Staging  Breast cancer of lower-outer quadrant of right female breast [facility]) Staging form: Breast, AJCC 7th Edition - Pathologic stage from 09/28/2014: Stage IIB (T2(m), N1a, cM0) - Unsigned Staged by: Pathologist Laterality: Right Multiple tumors: Yes - Clinical: Stage IIB (T2, N1, M0) - Signed by Layla Sandria BROCKS, MD on 11/17/2014    SUMMARY OF ONCOLOGIC HISTORY: Oncology History  Breast cancer of lower-outer quadrant of right female breast (HCC)  08/11/2014 Initial Biopsy   Right breast bx  (8 o'clock): IDC, grade 1-2.  ER+ (90%), PR+ (90%), HER- by FISH.  Ki67 11%.  Right breast bx (5 o'clock): IDC, grade 1-2, also with ADH and perineural invasion.  ER+ (99%), PR+ (95%), HER2- (ratio 1.82). Ki67 31%.    08/25/2014 Breast MRI   Right breast: Several discrete masses noted, the largest measuring 2.7 x 1.4 x 1.6 cm.  No evidence of left breast malignancy. No abnormal lymph nodes or evidence of chest wall invasion.    08/25/2014 Initial Diagnosis   Breast cancer of lower-outer quadrant of right female breast   08/26/2014 Miscellaneous   Genetic testing negative for ATM, BARD1, BRCA1, BRCA2, BRIP1, CDH1, CHEK2, MRE11A, MUTYH, NBN, NF1, PALB2, PTEN, RAD50, RAD51C, RAD51D, and TP53.    09/28/2014 Surgery   Right mastectomy with SLNB Warden): Invasive ducto-lobular cancer spanning 2.5 cm; grade 2. Second mass showed invasive ducto-lobular ca spanning 0.9 cm; grade 2. Negative margins. (+)LVI. Right axillary LNs 2/6 positive for mets [1 micromet, 1 macromet]   09/28/2014 Pathologic Stage   mpT2, pN1a, pMx: Stage IIB   10/27/2014 Echocardiogram   Pre-chemo EF: 50-55%.    11/08/2014 - 12/30/2014 Adjuvant Chemotherapy   Adriamycin /Cytoxan  x 4 cycles.    01/21/2015 - 02/18/2015 Adjuvant Chemotherapy   Taxol  x 5 cycles. (unable to complete 12 planned cycles d/t peripheral  neuropathy).    04/18/2015 - 05/26/2015 Radiation Therapy   Adjuvant RT completed Signe): Right chest wall/50.4 Elnor @ 1.8 Elnor per fraction x 28 fractions Right supraclavicular fossa/PAB 45 Gy @1 .8 Gy per fraction x 25 fractions   06/2015 -  Anti-estrogen oral therapy   Tamoxifen  daily. Planned duration of treatment: 5-10 years   08/30/2015 Survivorship   Survivorship care plan mailed to pt.      CURRENT THERAPY:  INTERVAL HISTORY:  Discussed the use of AI scribe software for clinical note transcription with the patient, who gave verbal consent to proceed.  Misty [name] 48 y.o. female returns for    Patient Active Problem List   Diagnosis Date Noted  . Hyperlipidemia 10/26/2022  . Persistent headaches 03/14/2021  . Empty sella 10/29/2016  . Genetic testing 10/28/2015  . Inverted nipple 08/25/2015  . Hot flashes 08/25/2015  . Edema of upper extremity 04/12/2015  . Chemotherapy-induced neuropathy 02/18/2015  . Nausea 12/02/2014  . Anorexia 12/02/2014  . Weight loss 12/02/2014  . Constipation 12/02/2014  . Fatigue 12/02/2014  . Hypotension 12/02/2014  . Urinary frequency 12/02/2014  . Dehydration 12/01/2014  . Celiac disease 09/02/2014  . Family history of malignant neoplasm of breast 08/26/2014  . 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  MEDICAL HISTORY: Past Medical History:  Diagnosis Date  . Anxiety    situational  . Cancer (HCC)    breast cancer  . Celiac disease   .  Headache(784.0)   . Hypothyroidism   . Personal history of chemotherapy   . Personal history of radiation therapy   . PONV (postoperative nausea and vomiting)   . Radiation 04/18/15-05/26/15   right breast  . Thyroid  disease     SURGICAL HISTORY: Past Surgical History:  Procedure Laterality Date  . CESAREAN SECTION    . CHOLECYSTECTOMY    . GALLBLADDER SURGERY    . LAPAROSCOPIC ASSISTED VAGINAL  HYSTERECTOMY N/A 09/27/2015   Procedure: LAPAROSCOPIC ASSISTED VAGINAL HYSTERECTOMY;  Surgeon: Oneil FORBES Piety, MD;  Location: [facility];  Service: Gynecology;  Laterality: N/A;  . LAPAROSCOPIC BILATERAL SALPINGO OOPHERECTOMY Bilateral 09/27/2015   Procedure: LAPAROSCOPIC BILATERAL SALPINGO OOPHORECTOMY;  Surgeon: Oneil FORBES Piety, MD;  Location: [facility];  Service: Gynecology;  Laterality: Bilateral;  . MASTECTOMY Right 2015   malignant  . PORT-A-CATH REMOVAL N/A 09/27/2015   Procedure: REMOVAL PORT-A-CATH;  Surgeon: Elon Pacini, MD;  Location: [facility];  Service: General;  Laterality: N/A;  Porta Cath removed intact without defects  . PORTACATH PLACEMENT Right 10/22/2014   Procedure: INSERTION PORT-A-CATH;  Surgeon: Elon Pacini, MD;  Location: [facility];  Service: General;  Laterality: Right;  . SIMPLE MASTECTOMY WITH AXILLARY SENTINEL NODE BIOPSY Right 09/28/2014   Procedure: RIGHT TOTAL MASTECTOMY, RIGHT  AXILLARY SENTINEL NODE BIOPSY;  Surgeon: Elon Pacini, MD;  Location: [facility];  Service: General;  Laterality: Right;    SOCIAL HISTORY: Social History   Socioeconomic History  . Marital status: Married    Spouse name: Not on file  . Number of children: 1  . Years of education: Not on file  . Highest education level: Not on file  Occupational History    Comment: [employer]  Tobacco Use  . Smoking status: Never  . Smokeless tobacco: Never  Vaping Use  . Vaping status: Never Used  Substance and Sexual Activity  . Alcohol use: No    Alcohol/week: 0.0 standard drinks of alcohol  . Drug use: No  . Sexual activity: Yes  Other Topics Concern  . Not on file  Social History Narrative  . Not on file   Social Drivers of Health   Financial Resource Strain: Not on file  Food Insecurity: Not on file  Transportation Needs: Not on file  Physical Activity: Not on file  Stress: Not on file  Social Connections: Not on file  Intimate  Partner Violence: Not on file    FAMILY HISTORY: Family History  Problem Relation Age of Onset  . Skin cancer Father   . Breast cancer Maternal Grandmother        dx unknown age  . Breast cancer Other 55       mat great aunt through [facility] with breast cancer    Review of Systems - Oncology    PHYSICAL EXAMINATION   Onc Performance Status - 09/30/24 0905       KPS SCALE   KPS % SCORE Able to carry on normal activity, minor s/s of disease          Vitals:   09/30/24 0905  BP: (!) 110/56  Pulse: 78  Resp: 18  Temp: 97.9 F (36.6 C)  SpO2: 100%    Physical Exam  LABORATORY DATA:  CBC    Component Value Date/Time   WBC 5.6 08/12/2023 0931   WBC 6.9 02/09/2020 1403   RBC 4.44 08/12/2023 0931   HGB 13.9 08/12/2023 0931   HGB 14.2 10/17/2016 0850   HCT  42.3 08/12/2023 0931   HCT 43.0 10/17/2016 0850   PLT 196 08/12/2023 0931   PLT 168 10/17/2016 0850   MCV 95.3 08/12/2023 0931   MCV 93.5 10/17/2016 0850   MCH 31.3 08/12/2023 0931   MCHC 32.9 08/12/2023 0931   RDW 13.2 08/12/2023 0931   RDW 13.4 10/17/2016 0850   LYMPHSABS 1.1 08/12/2023 0931   LYMPHSABS 0.9 10/17/2016 0850   MONOABS 0.4 08/12/2023 0931   MONOABS 0.4 10/17/2016 0850   EOSABS 0.3 08/12/2023 0931   EOSABS 0.2 10/17/2016 0850   BASOSABS 0.1 08/12/2023 0931   BASOSABS 0.0 10/17/2016 0850    CMP     Component Value Date/Time   NA 142 08/12/2023 0931   NA 143 10/17/2016 0850   K 4.7 08/12/2023 0931   K 4.9 10/17/2016 0850   CL 107 08/12/2023 0931   CO2 30 08/12/2023 0931   CO2 28 10/17/2016 0850   GLUCOSE 93 08/12/2023 0931   GLUCOSE 98 10/17/2016 0850   BUN 11 08/12/2023 0931   BUN 17.0 10/17/2016 0850   CREATININE 0.67 08/12/2023 0931   CREATININE 0.7 10/17/2016 0850   CALCIUM 9.6 08/12/2023 0931   CALCIUM 10.2 10/17/2016 0850   PROT 7.6 08/12/2023 0931   PROT 7.5 10/17/2016 0850   ALBUMIN 4.4 08/12/2023 0931   ALBUMIN 4.0 10/17/2016 0850   AST 18 08/12/2023 0931   AST 21  10/17/2016 0850   ALT 10 08/12/2023 0931   ALT 16 10/17/2016 0850   ALKPHOS 71 08/12/2023 0931   ALKPHOS 107 10/17/2016 0850   BILITOT 0.4 08/12/2023 0931   BILITOT 0.44 10/17/2016 0850   GFRNONAA >60 08/12/2023 0931   GFRAA >60 09/16/2020 1144     ASSESSMENT and THERAPY PLAN:   No problem-specific Assessment & Plan notes found for this encounter.     All questions were answered. The patient knows to call the clinic with any problems, questions or concerns. We can certainly see the patient much sooner if necessary.  Total encounter time:*** minutes*in face-to-face visit time, chart review, lab review, care coordination, order entry, and documentation of the encounter time.    Morna Kendall, NP 09/30/24 9:25 AM Medical Oncology and Hematology [facility] [address] [place], [place] [address] Tel. [phone]    Fax. [phone]  *Total Encounter Time as defined by the Centers for Medicare and Medicaid Services includes, in addition to the face-to-face time of a patient visit (documented in the note above) non-face-to-face time: obtaining and reviewing outside history, ordering and reviewing medications, tests or procedures, care coordination (communications with other health care professionals or caregivers) and documentation in the medical record.

## 2024-10-01 ENCOUNTER — Encounter: Payer: Self-pay | Admitting: Adult Health

## 2024-10-01 LAB — FANA STAINING PATTERNS: Speckled Pattern: 24529 — ABNORMAL HIGH

## 2024-10-01 LAB — MISC LABCORP TEST (SEND OUT): Labcorp test code: 81950

## 2024-10-01 LAB — ANTINUCLEAR ANTIBODIES, IFA: ANA Ab, IFA: POSITIVE — AB

## 2024-10-05 ENCOUNTER — Encounter: Payer: Self-pay | Admitting: Adult Health

## 2024-10-06 ENCOUNTER — Ambulatory Visit (HOSPITAL_COMMUNITY)
Admission: RE | Admit: 2024-10-06 | Discharge: 2024-10-06 | Disposition: A | Discharge status: Home / Self Care | Source: Ambulatory Visit | Attending: Adult Health | Admitting: Adult Health

## 2024-10-06 DIAGNOSIS — M816 Localized osteoporosis [Lequesne]: Secondary | ICD-10-CM | POA: Diagnosis present

## 2024-10-09 ENCOUNTER — Telehealth: Payer: Self-pay | Admitting: Adult Health

## 2024-10-09 NOTE — Telephone Encounter (Signed)
 left vm for pt to call back and schedule f/u appt with Morna to discuss bone density testing

## 2024-10-13 ENCOUNTER — Inpatient Hospital Stay: Admitting: Adult Health

## 2024-10-13 ENCOUNTER — Other Ambulatory Visit: Payer: Self-pay | Admitting: *Deleted

## 2024-10-13 ENCOUNTER — Other Ambulatory Visit: Payer: Self-pay

## 2024-10-13 DIAGNOSIS — Z923 Personal history of irradiation: Secondary | ICD-10-CM

## 2024-10-13 DIAGNOSIS — C50511 Malignant neoplasm of lower-outer quadrant of right female breast: Secondary | ICD-10-CM

## 2024-10-13 DIAGNOSIS — Z803 Family history of malignant neoplasm of breast: Secondary | ICD-10-CM

## 2024-10-13 DIAGNOSIS — Z9221 Personal history of antineoplastic chemotherapy: Secondary | ICD-10-CM | POA: Diagnosis not present

## 2024-10-13 DIAGNOSIS — Z853 Personal history of malignant neoplasm of breast: Secondary | ICD-10-CM | POA: Diagnosis not present

## 2024-10-13 DIAGNOSIS — M818 Other osteoporosis without current pathological fracture: Secondary | ICD-10-CM

## 2024-10-13 DIAGNOSIS — R7689 Other specified abnormal immunological findings in serum: Secondary | ICD-10-CM

## 2024-10-13 NOTE — Progress Notes (Signed)
 [facility] Cancer Follow up:    Misty Leta NOVAK, MD [address] [place] [address]   DIAGNOSIS: Cancer Staging  Breast cancer of lower-outer quadrant of right female breast [facility]) Staging form: Breast, AJCC 7th Edition - Pathologic stage from 09/28/2014: Stage IIB (T2(m), N1a, cM0) - Unsigned Staged by: Pathologist Laterality: Right Multiple tumors: Yes - Clinical: Stage IIB (T2, N1, M0) - Signed by Layla Sandria BROCKS, MD on 11/17/2014  I connected with Misty [name] on 10/13/24 at  8:40 AM EDT by telephone and verified that I am speaking with the correct person using two identifiers.  I discussed the limitations, risks, security and privacy concerns of performing an evaluation and management service by telephone and the availability of in person appointments.  I also discussed with the patient that there may be a patient responsible charge related to this service. The patient expressed understanding and agreed to proceed.  Patient location: home Provider location: [facility] office  SUMMARY OF ONCOLOGIC HISTORY: Oncology History  Breast cancer of lower-outer quadrant of right female breast (HCC)  08/11/2014 Initial Biopsy   Right breast bx  (8 o'clock): IDC, grade 1-2.  ER+ (90%), PR+ (90%), HER- by FISH.  Ki67 11%.  Right breast bx (5 o'clock): IDC, grade 1-2, also with ADH and perineural invasion.  ER+ (99%), PR+ (95%), HER2- (ratio 1.82). Ki67 31%.    08/25/2014 Breast MRI   Right breast: Several discrete masses noted, the largest measuring 2.7 x 1.4 x 1.6 cm.  No evidence of left breast malignancy. No abnormal lymph nodes or evidence of chest wall invasion.    08/25/2014 Initial Diagnosis   Breast cancer of lower-outer quadrant of right female breast   08/26/2014 Miscellaneous   Genetic testing negative for ATM, BARD1, BRCA1, BRCA2, BRIP1, CDH1, CHEK2, MRE11A, MUTYH, NBN, NF1, PALB2, PTEN, RAD50, RAD51C, RAD51D, and TP53.    09/28/2014 Surgery   Right mastectomy with SLNB  Warden): Invasive ducto-lobular cancer spanning 2.5 cm; grade 2. Second mass showed invasive ducto-lobular ca spanning 0.9 cm; grade 2. Negative margins. (+)LVI. Right axillary LNs 2/6 positive for mets [1 micromet, 1 macromet]   09/28/2014 Pathologic Stage   mpT2, pN1a, pMx: Stage IIB   10/27/2014 Echocardiogram   Pre-chemo EF: 50-55%.    11/08/2014 - 12/30/2014 Adjuvant Chemotherapy   Adriamycin /Cytoxan  x 4 cycles.    01/21/2015 - 02/18/2015 Adjuvant Chemotherapy   Taxol  x 5 cycles. (unable to complete 12 planned cycles d/t peripheral neuropathy).    04/18/2015 - 05/26/2015 Radiation Therapy   Adjuvant RT completed Signe): Right chest wall/50.4 Elnor @ 1.8 Elnor per fraction x 28 fractions Right supraclavicular fossa/PAB 45 Gy @1 .8 Gy per fraction x 25 fractions   06/2015 -  Anti-estrogen oral therapy   Tamoxifen  daily. Planned duration of treatment: 5-10 years   08/30/2015 Survivorship   Survivorship care plan mailed to pt.      CURRENT THERAPY: observation  INTERVAL HISTORY:  Discussed the use of AI scribe software for clinical note transcription with the patient, who gave verbal consent to proceed.  History of Present Illness Misty [name] is a 48 year old female with osteoporosis and celiac disease who presents for follow-up on bone density results and referral to rheumatology.  Recent bone density results show a T-score of negative 2.9 in the lumbar spine. She has not tried calcium supplementation but has attempted vitamin D  supplementation.  She adheres to a strict gluten-free diet due to celiac disease. She experienced vomiting with  a gluten-free liquid vitamin D , leading to discontinuation. She is uncertain if this was due to gluten contamination or the vitamin D  itself. Her mother has similar symptoms with vitamin D .    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  MEDICAL HISTORY: Past Medical History:  Diagnosis Date   Anxiety    situational   Cancer [facility])    breast cancer   Celiac disease    Headache(784.0)    Hypothyroidism    Personal history of chemotherapy    Personal history of radiation therapy    PONV (postoperative nausea and vomiting)    Radiation 04/18/15-05/26/15   right breast   Thyroid  disease     SURGICAL HISTORY: Past Surgical History:  Procedure Laterality Date   CESAREAN SECTION     CHOLECYSTECTOMY     GALLBLADDER SURGERY     LAPAROSCOPIC ASSISTED VAGINAL HYSTERECTOMY N/A 09/27/2015   Procedure: LAPAROSCOPIC ASSISTED VAGINAL HYSTERECTOMY;  Surgeon: Oneil FORBES Piety, MD;  Location: [facility];  Service: Gynecology;  Laterality: N/A;   LAPAROSCOPIC BILATERAL SALPINGO OOPHERECTOMY Bilateral 09/27/2015   Procedure: LAPAROSCOPIC BILATERAL SALPINGO OOPHORECTOMY;  Surgeon: Oneil FORBES Piety, MD;  Location: [facility];  Service: Gynecology;  Laterality: Bilateral;   MASTECTOMY Right 2015   malignant   PORT-A-CATH REMOVAL N/A 09/27/2015   Procedure: REMOVAL PORT-A-CATH;  Surgeon: Elon Pacini, MD;  Location: [facility];  Service: General;  Laterality: N/A;  Porta Cath removed intact without defects   PORTACATH PLACEMENT Right 10/22/2014   Procedure: INSERTION PORT-A-CATH;  Surgeon: Elon Pacini, MD;  Location: [facility];  Service: General;  Laterality:  Right;   SIMPLE MASTECTOMY WITH AXILLARY SENTINEL NODE BIOPSY Right 09/28/2014   Procedure: RIGHT TOTAL MASTECTOMY, RIGHT  AXILLARY SENTINEL NODE BIOPSY;  Surgeon: Elon Pacini, MD;  Location: [facility];  Service: General;  Laterality: Right;    SOCIAL HISTORY: Social History   Socioeconomic History   Marital status: Married    Spouse name: Not on file   Number of children: 1   Years of education: Not on file   Highest education level: Not on file  Occupational History    Comment: [employer]  Tobacco Use   Smoking status: Never   Smokeless tobacco: Never  Vaping Use   Vaping status: Never Used  Substance and Sexual Activity   Alcohol use: No    Alcohol/week: 0.0 standard drinks of alcohol   Drug use: No   Sexual activity: Yes  Other Topics Concern   Not on file  Social History Narrative   Not on file   Social Drivers of Health   Financial Resource Strain: Not on file  Food Insecurity: Not on file  Transportation Needs: Not on file  Physical Activity: Not on file  Stress: Not on file  Social Connections: Not on file  Intimate Partner Violence: Not on file    FAMILY HISTORY: Family History  Problem Relation Age of Onset   Skin cancer Father    Breast cancer Maternal Grandmother        dx unknown age   Breast cancer Other 40       mat great aunt through [facility] with breast cancer    Review of Systems  Constitutional:  Negative for appetite change, chills, fatigue, fever and unexpected weight change.  HENT:   Negative for hearing loss, lump/mass and trouble swallowing.   Eyes:  Negative for eye problems and icterus.  Respiratory:  Negative for chest tightness, cough and shortness of breath.   Cardiovascular:  Negative for chest pain, leg swelling and palpitations.  Gastrointestinal:  Negative for abdominal distention, abdominal pain, constipation, diarrhea, nausea and vomiting.  Endocrine: Negative for hot flashes.  Genitourinary:   Negative for difficulty urinating.   Musculoskeletal:  Negative for arthralgias.  Skin:  Negative for itching and rash.  Neurological:  Negative for dizziness, extremity weakness, headaches and numbness.  Hematological:  Negative for adenopathy. Does not bruise/bleed easily.  Psychiatric/Behavioral:  Negative for depression. The patient is not nervous/anxious.       PHYSICAL EXAMINATION  Patient sounds well, in no apparent distress, mood and behavior are normal, speech is normal, breathing is non labored  ASSESSMENT and THERAPY PLAN:   Assessment & Plan History of Breast cancer  - No clinical signs of recurrence - Continue on observation with annual mammograms and clinical breast exam - Return in 09/2025 for long term surveillance.  Osteoporosis of the lumbar spine Osteoporosis confirmed with T-score of -2.9. Non-pharmacological management due to celiac disease and ANA positivity. - Calcium daily. - Vitamin D  1000 IU daily, increase to 5000 IU if tolerated. Consider gluten-free, chewable forms. - Encourage weight-bearing exercises such as walking, lifting weights. - Repeat bone density test in two years.    Follow up instructions:    -Return to cancer center in 09/2025 for follow up  The patient was provided an opportunity to ask questions and all were answered. The patient agreed with the plan and demonstrated an understanding of the instructions.   The patient was advised to call back or seek an in-person evaluation if the symptoms worsen or if the condition fails to improve as anticipated.   I provided 15 minutes of non face-to-face telephone visit time during this encounter, and > 50% was spent counseling as documented under my assessment & plan.   Misty Kendall, NP 10/13/24 8:43 AM Medical Oncology and Hematology [facility] [address] [place], [place] [address] Tel. [phone]    Fax. [phone]  *Total Encounter Time as defined by the  Centers for Medicare and Medicaid Services includes, in addition to the face-to-face time of a patient visit (documented in the note above) non-face-to-face time: obtaining and reviewing outside history, ordering and reviewing medications, tests or procedures, care coordination (communications with other health care professionals or caregivers) and documentation in the medical record.

## 2024-11-02 ENCOUNTER — Other Ambulatory Visit: Payer: Self-pay | Admitting: Endocrinology

## 2024-11-02 DIAGNOSIS — E039 Hypothyroidism, unspecified: Secondary | ICD-10-CM

## 2024-11-02 DIAGNOSIS — E041 Nontoxic single thyroid nodule: Secondary | ICD-10-CM

## 2024-11-05 ENCOUNTER — Inpatient Hospital Stay: Admission: RE | Admit: 2024-11-05 | Discharge: 2024-11-05 | Attending: Endocrinology | Admitting: Endocrinology

## 2024-11-05 DIAGNOSIS — E041 Nontoxic single thyroid nodule: Secondary | ICD-10-CM

## 2024-11-05 DIAGNOSIS — E039 Hypothyroidism, unspecified: Secondary | ICD-10-CM

## 2024-11-16 DIAGNOSIS — I700 Atherosclerosis of aorta: Secondary | ICD-10-CM | POA: Insufficient documentation

## 2024-11-16 DIAGNOSIS — R002 Palpitations: Secondary | ICD-10-CM | POA: Insufficient documentation

## 2024-11-16 NOTE — Progress Notes (Unsigned)
 Cardiology Office Note:   Date:  11/18/2024  ID:  Misty [name], DOB 1976-12-05, MRN [id number] PCP: Rosamond Leta NOVAK, MD  [facility] Providers Cardiologist:  Lynwood Schilling, MD {  History of Present Illness:   Misty [name] is a 48 y.o. female who saw Dr. Rolan in 2023 for evaluation of first degree relatives with cardiac disease.  CTA in 2022 showed a normal CTA with no calcium.  She had palpitations.   She had PACs on a monitor.    She presents with results on a CT that that was done for surveillance purposes as she has had history of cancer.  The report said No normal caliber thoracic aorta she was very concerned about this and wondered if this could mean that she does have a large thoracic aorta.  However, I looked through multiple CTs including contrasted CTs of the chest and there is normal caliber aorta mentioned.  She does describe feelings of lightheadedness.  It happens sporadically.  It is not positional.  She might feel her heart fluttering.  She does not feel like going extremely rapidly.  She will get a little lightheaded.  It passes quickly.  It seems to be happening every day.  Is not happening with activities.  She is very limited by joint pains.  She is very limited by just fatigue.  She can do some household chores like laundry and it does not bring on the symptoms.  She is not having any new chest pressure, neck or arm discomfort.  She is having no weight gain or edema.  ROS: As stated in the HPI and negative for all other systems.  Studies Reviewed:    EKG:   EKG Interpretation Date/Time:  Wednesday November 18 2024 09:18:51 EST Ventricular Rate:  73 PR Interval:  134 QRS Duration:  84 QT Interval:  370 QTC Calculation: 407 R Axis:   67  Text Interpretation: Normal sinus rhythm When compared with ECG of 26-Oct-2022 11:44, No significant change was found Confirmed by Schilling Lynwood ([id number]) on 11/18/2024 9:40:59 AM    Risk Assessment/Calculations:               Physical Exam:   VS:  BP 114/73 (BP Location: Left Arm, Patient Position: Sitting, Cuff Size: Normal)   Pulse 73   Ht 5' 1 (1.549 m)   Wt 126 lb 6.4 oz (57.3 kg)   LMP 09/21/2015   SpO2 98%   BMI 23.88 kg/m    Wt Readings from Last 3 Encounters:  11/18/24 126 lb 6.4 oz (57.3 kg)  08/12/23 126 lb 11.2 oz (57.5 kg)  10/26/22 122 lb 3.2 oz (55.4 kg)     GEN: Well nourished, well developed in no acute distress NECK: No JVD; No carotid bruits CARDIAC: RRR, no murmurs, rubs, gallops RESPIRATORY:  Clear to auscultation without rales, wheezing or rhonchi  ABDOMEN: Soft, non-tender, non-distended EXTREMITIES:  No edema; No deformity   ASSESSMENT AND PLAN:       Aortic atherosclerosis: We had a long discussion about this.  She had no coronary artery disease on her CT and 0 calcium.  I will check a lipid profile but at this point I do not think she needs further testing.  Palpitations: I will have her wear a 3-day Zio patch since she is getting lightheaded episodes every day.  She is not orthostatic by description.  If this is normal no further workup.  History of doxorubicin :    She had a  normal echo in 2022.   She was worried because somewhere she saw the diagnosis of heart failure.  She had echocardiography to rule this out and there was no evidence of LV dysfunction and she has had no symptoms of heart failure.  I would not think that further testing is necessary.  Abnormal CT: She was worried about having an enlarged thoracic aorta but I do see multiple reports and there is no evidence of this.  It is mention specifically in some reports is being normal caliber thoracic aorta.  No further testing.   Follow up with me as needed  Signed, Lynwood Schilling, MD

## 2024-11-18 ENCOUNTER — Ambulatory Visit: Attending: Cardiology | Admitting: Cardiology

## 2024-11-18 ENCOUNTER — Encounter: Payer: Self-pay | Admitting: Cardiology

## 2024-11-18 ENCOUNTER — Ambulatory Visit

## 2024-11-18 VITALS — BP 114/73 | HR 73 | Ht 61.0 in | Wt 126.4 lb

## 2024-11-18 DIAGNOSIS — E785 Hyperlipidemia, unspecified: Secondary | ICD-10-CM | POA: Diagnosis not present

## 2024-11-18 DIAGNOSIS — R002 Palpitations: Secondary | ICD-10-CM

## 2024-11-18 DIAGNOSIS — I700 Atherosclerosis of aorta: Secondary | ICD-10-CM

## 2024-11-18 NOTE — Patient Instructions (Addendum)
 Medication Instructions:  Your physician recommends that you continue on your current medications as directed. Please refer to the Current Medication list given to you today.  *If you need a refill on your cardiac medications before your next appointment, please call your pharmacy*  Lab Work: Fasting lipid panel at [facility] If you have labs (blood work) drawn today and your tests are completely normal, you will receive your results only by: MyChart Message (if you have MyChart) OR A paper copy in the mail If you have any lab test that is abnormal or we need to change your treatment, we will call you to review the results.  Testing/Procedures: 3 Day Zio Heart Monitor Your physician has requested that you wear a Zio heart monitor for __3___ days. This will be mailed to your home with instructions on how to apply the monitor and how to return it when finished. Please allow 2 weeks after returning the heart monitor before our office calls you with the results.   Follow-Up: At [facility], you and your health needs are our priority.  As part of our continuing mission to provide you with exceptional heart care, our providers are all part of one team.  This team includes your primary Cardiologist (physician) and Advanced Practice Providers or APPs (Physician Assistants and Nurse Practitioners) who all work together to provide you with the care you need, when you need it.  Your next appointment:   As needed  Provider:   [place], MD  We recommend signing up for the patient portal called MyChart.  Sign up information is provided on this After Visit Summary.  MyChart is used to connect with patients for Virtual Visits (Telemedicine).  Patients are able to view lab/test results, encounter notes, upcoming appointments, etc.  Non-urgent messages can be sent to your provider as well.   To learn more about what you can do with MyChart, go to [email or web address].   Other Instructions ZIO  XT- Long Term Monitor Instructions  Your physician has requested you wear a ZIO patch monitor for 3 days.  This is a single patch monitor. [employer] supplies one patch monitor per enrollment. Additional stickers are not available. Please do not apply patch if you will be having a Nuclear Stress Test,  Echocardiogram, Cardiac CT, MRI, or Chest Xray during the period you would be wearing the  monitor. The patch cannot be worn during these tests. You cannot remove and re-apply the  ZIO XT patch monitor.  Your ZIO patch monitor will be mailed 3 day USPS to your address on file. It may take 3-5 days  to receive your monitor after you have been enrolled.  Once you have received your monitor, please review the enclosed instructions. Your monitor  has already been registered assigning a specific monitor serial # to you.  Billing and Patient Assistance Program Information  We have supplied [employer] with any of your insurance information on file for billing purposes. [employer] offers a sliding scale Patient Assistance Program for patients that do not have  insurance, or whose insurance does not completely cover the cost of the ZIO monitor.  You must apply for the Patient Assistance Program to qualify for this discounted rate.  To apply, please call [employer] at [phone], select option 4, select option 2, ask to apply for  Patient Assistance Program. Meredeth will ask your household income, and how many people  are in your household. They will quote your out-of-pocket cost based on that information.  [employer] will also  be able to set up a 107-month, interest-free payment plan if needed.  Applying the monitor   Shave hair from upper left chest.  Hold abrader disc by orange tab. Rub abrader in 40 strokes over the upper left chest as  indicated in your monitor instructions.  Clean area with 4 enclosed alcohol pads. Let dry.  Apply patch as indicated in monitor instructions. Patch will be placed under  collarbone on left  side of chest with arrow pointing upward.  Rub patch adhesive wings for 2 minutes. Remove white label marked 1. Remove the white  label marked 2. Rub patch adhesive wings for 2 additional minutes.  While looking in a mirror, press and release button in center of patch. A small green light will  flash 3-4 times. This will be your only indicator that the monitor has been turned on.  Do not shower for the first 24 hours. You may shower after the first 24 hours.  Press the button if you feel a symptom. You will hear a small click. Record Date, Time and  Symptom in the Patient Logbook.  When you are ready to remove the patch, follow instructions on the last 2 pages of Patient  Logbook. Stick patch monitor onto the last page of Patient Logbook.  Place Patient Logbook in the blue and white box. Use locking tab on box and tape box closed  securely. The blue and white box has prepaid postage on it. Please place it in the mailbox as  soon as possible. Your physician should have your test results approximately 7 days after the  monitor has been mailed back to [facility].  Call [facility] Customer Care at [phone] if you have questions regarding  your ZIO XT patch monitor. Call them immediately if you see an orange light blinking on your  monitor.  If your monitor falls off in less than 4 days, contact our Monitor department at [phone].  If your monitor becomes loose or falls off after 4 days call [employer] at [phone] for  suggestions on securing your monitor

## 2024-11-18 NOTE — Progress Notes (Unsigned)
 Enrolled patient for a 3 day Zio XT monitor to be mailed to patients home

## 2024-12-04 DIAGNOSIS — R002 Palpitations: Secondary | ICD-10-CM | POA: Diagnosis not present

## 2024-12-04 LAB — LIPID PANEL
Chol/HDL Ratio: 3.5 ratio (ref 0.0–4.4)
Cholesterol, Total: 223 mg/dL — ABNORMAL HIGH (ref 100–199)
HDL: 63 mg/dL (ref >39)
LDL Chol Calc (NIH): 149 mg/dL — ABNORMAL HIGH (ref 0–99)
Triglycerides: 63 mg/dL (ref 0–149)
VLDL Cholesterol Cal: 11 mg/dL (ref 5–40)

## 2024-12-06 ENCOUNTER — Ambulatory Visit: Payer: Self-pay | Admitting: Cardiology

## 2024-12-06 DIAGNOSIS — E785 Hyperlipidemia, unspecified: Secondary | ICD-10-CM

## 2025-01-05 NOTE — Progress Notes (Unsigned)
 "  Office Visit Note  Patient: Misty [name]             Date of Birth: 01-23-1976           MRN: [id number]             PCP: Rosamond Leta NOVAK, MD Referring: Crawford Morna Pickle, NP Visit Date: 01/11/2025 Occupation: Data Unavailable  Subjective:  No chief complaint on file.   History of Present Illness: Misty [name] is a 49 y.o. female ***     Activities of Daily Living:  Patient reports morning stiffness for *** {minute/hour:19697}.   Patient {ACTIONS;DENIES/REPORTS:21021675::Denies} nocturnal pain.  Difficulty dressing/grooming: {ACTIONS;DENIES/REPORTS:21021675::Denies} Difficulty climbing stairs: {ACTIONS;DENIES/REPORTS:21021675::Denies} Difficulty getting out of chair: {ACTIONS;DENIES/REPORTS:21021675::Denies} Difficulty using hands for taps, buttons, cutlery, and/or writing: {ACTIONS;DENIES/REPORTS:21021675::Denies}  No Rheumatology ROS completed.   PMFS History:  Patient Active Problem List   Diagnosis Date Noted   Palpitations 11/16/2024   Aortic atherosclerosis 11/16/2024   Hyperlipidemia 10/26/2022   Persistent headaches 03/14/2021   Empty sella 10/29/2016   Genetic testing 10/28/2015   Inverted nipple 08/25/2015   Hot flashes 08/25/2015   Edema of upper extremity 04/12/2015   Chemotherapy-induced neuropathy 02/18/2015   Nausea 12/02/2014   Anorexia 12/02/2014   Weight loss 12/02/2014   Constipation 12/02/2014   Fatigue 12/02/2014   Hypotension 12/02/2014   Urinary frequency 12/02/2014   Dehydration 12/01/2014   Celiac disease 09/02/2014   Family history of malignant neoplasm of breast 08/26/2014   Breast cancer of lower-outer quadrant of right female breast (HCC) 08/25/2014    Past Medical History:  Diagnosis Date   Anxiety    situational   Cancer (HCC)    breast cancer   Celiac disease    Headache(784.0)    Hypothyroidism    Personal history of chemotherapy    Personal history of radiation therapy    PONV (postoperative nausea  and vomiting)    Radiation 04/18/15-05/26/15   right breast   Thyroid  disease     Family History  Problem Relation Age of Onset   Skin cancer Father    Breast cancer Maternal Grandmother        dx unknown age   Breast cancer Other 76       mat great aunt through [facility] with breast cancer   Past Surgical History:  Procedure Laterality Date   CESAREAN SECTION     CHOLECYSTECTOMY     GALLBLADDER SURGERY     LAPAROSCOPIC ASSISTED VAGINAL HYSTERECTOMY N/A 09/27/2015   Procedure: LAPAROSCOPIC ASSISTED VAGINAL HYSTERECTOMY;  Surgeon: Oneil FORBES Piety, MD;  Location: [facility];  Service: Gynecology;  Laterality: N/A;   LAPAROSCOPIC BILATERAL SALPINGO OOPHERECTOMY Bilateral 09/27/2015   Procedure: LAPAROSCOPIC BILATERAL SALPINGO OOPHORECTOMY;  Surgeon: Oneil FORBES Piety, MD;  Location: [facility];  Service: Gynecology;  Laterality: Bilateral;   MASTECTOMY Right 2015   malignant   PORT-A-CATH REMOVAL N/A 09/27/2015   Procedure: REMOVAL PORT-A-CATH;  Surgeon: Elon Pacini, MD;  Location: [facility];  Service: General;  Laterality: N/A;  Porta Cath removed intact without defects   PORTACATH PLACEMENT Right 10/22/2014   Procedure: INSERTION PORT-A-CATH;  Surgeon: Elon Pacini, MD;  Location: [facility];  Service: General;  Laterality: Right;   SIMPLE MASTECTOMY WITH AXILLARY SENTINEL NODE BIOPSY Right 09/28/2014   Procedure: RIGHT TOTAL MASTECTOMY, RIGHT  AXILLARY SENTINEL NODE BIOPSY;  Surgeon: Elon Pacini, MD;  Location: [facility];  Service: General;  Laterality: Right;   Social History[1] Social History   Social  History Narrative   Not on file      There is no immunization history on file for this patient.   Objective: Vital Signs: LMP 09/21/2015    Physical Exam   Musculoskeletal Exam: ***  CDAI Exam: CDAI Score: -- Patient Global: --; Provider Global: -- Swollen: --; Tender: -- Joint Exam 01/11/2025   No joint exam has been documented for this visit    There is currently no information documented on the homunculus. Go to the Rheumatology activity and complete the homunculus joint exam.  Investigation: No additional findings.  Imaging: No results found.  Recent Labs: Lab Results  Component Value Date   WBC 5.2 09/30/2024   HGB 14.2 09/30/2024   PLT 218 09/30/2024   NA 141 09/30/2024   K 4.0 09/30/2024   CL 106 09/30/2024   CO2 29 09/30/2024   GLUCOSE 86 09/30/2024   BUN 14 09/30/2024   CREATININE 0.65 09/30/2024   BILITOT 0.5 09/30/2024   ALKPHOS 67 09/30/2024   AST 17 09/30/2024   ALT 9 09/30/2024   PROT 8.0 09/30/2024   ALBUMIN 4.8 09/30/2024   CALCIUM 10.1 09/30/2024   GFRAA >60 09/16/2020    Speciality Comments: No specialty comments available.  Procedures:  No procedures performed Allergies: Gluten meal and Penicillins   Assessment / Plan:     Visit Diagnoses: No diagnosis found.  Orders: No orders of the defined types were placed in this encounter.  No orders of the defined types were placed in this encounter.   Face-to-face time spent with patient was *** minutes. Greater than 50% of time was spent in counseling and coordination of care.  Follow-Up Instructions: No follow-ups on file.   Alfonso Patterson, LPN  Note - This record has been created using [employer].  Chart creation errors have been sought, but may not always  have been located. Such creation errors do not reflect on  the standard of medical care.    [1]  Social History Tobacco Use   Smoking status: Never   Smokeless tobacco: Never  Vaping Use   Vaping status: Never Used  Substance Use Topics   Alcohol use: No    Alcohol/week: 0.0 standard drinks of alcohol   Drug use: No   "

## 2025-01-11 ENCOUNTER — Encounter

## 2025-03-12 ENCOUNTER — Ambulatory Visit: Admitting: Diagnostic Neuroimaging

## 2025-09-30 ENCOUNTER — Ambulatory Visit: Admitting: Adult Health
